# Patient Record
Sex: Male | Born: 1945
Health system: Southern US, Community
[De-identification: ages and names within clinical notes are randomized; demographics above are authoritative.]

## PROBLEM LIST (undated history)

## (undated) DIAGNOSIS — I2699 Other pulmonary embolism without acute cor pulmonale: Secondary | ICD-10-CM

## (undated) DIAGNOSIS — I214 Non-ST elevation (NSTEMI) myocardial infarction: Secondary | ICD-10-CM

## (undated) DIAGNOSIS — Z720 Tobacco use: Secondary | ICD-10-CM

## (undated) DIAGNOSIS — I10 Essential (primary) hypertension: Secondary | ICD-10-CM

## (undated) DIAGNOSIS — I480 Paroxysmal atrial fibrillation: Secondary | ICD-10-CM

## (undated) DIAGNOSIS — F1010 Alcohol abuse, uncomplicated: Secondary | ICD-10-CM

## (undated) HISTORY — DX: Other pulmonary embolism without acute cor pulmonale: I26.99

## (undated) HISTORY — DX: Non-ST elevation (NSTEMI) myocardial infarction: I21.4

## (undated) HISTORY — DX: Paroxysmal atrial fibrillation: I48.0

---

## 2018-06-14 DIAGNOSIS — I480 Paroxysmal atrial fibrillation: Secondary | ICD-10-CM

## 2018-06-14 HISTORY — DX: Paroxysmal atrial fibrillation: I48.0

## 2018-06-29 ENCOUNTER — Encounter (HOSPITAL_COMMUNITY): Payer: Self-pay | Admitting: Emergency Medicine

## 2018-06-29 ENCOUNTER — Inpatient Hospital Stay (HOSPITAL_COMMUNITY)
Admission: EM | Admit: 2018-06-29 | Discharge: 2018-07-07 | DRG: 233 | Disposition: A | Discharge status: Home / Self Care | Payer: Medicare PPO | Attending: Cardiothoracic Surgery | Admitting: Cardiothoracic Surgery

## 2018-06-29 ENCOUNTER — Other Ambulatory Visit: Payer: Self-pay

## 2018-06-29 ENCOUNTER — Encounter (HOSPITAL_COMMUNITY)
Admission: EM | Disposition: A | Discharge status: Home / Self Care | Payer: Self-pay | Source: Home / Self Care | Attending: Cardiothoracic Surgery

## 2018-06-29 ENCOUNTER — Inpatient Hospital Stay (HOSPITAL_COMMUNITY): Payer: Medicare PPO

## 2018-06-29 ENCOUNTER — Emergency Department (HOSPITAL_COMMUNITY): Payer: Medicare PPO

## 2018-06-29 ENCOUNTER — Other Ambulatory Visit (HOSPITAL_COMMUNITY): Payer: Medicare PPO

## 2018-06-29 DIAGNOSIS — I110 Hypertensive heart disease with heart failure: Secondary | ICD-10-CM | POA: Diagnosis present

## 2018-06-29 DIAGNOSIS — Z01810 Encounter for preprocedural cardiovascular examination: Secondary | ICD-10-CM | POA: Diagnosis not present

## 2018-06-29 DIAGNOSIS — I361 Nonrheumatic tricuspid (valve) insufficiency: Secondary | ICD-10-CM | POA: Diagnosis not present

## 2018-06-29 DIAGNOSIS — I214 Non-ST elevation (NSTEMI) myocardial infarction: Principal | ICD-10-CM | POA: Diagnosis present

## 2018-06-29 DIAGNOSIS — F1021 Alcohol dependence, in remission: Secondary | ICD-10-CM | POA: Diagnosis present

## 2018-06-29 DIAGNOSIS — I2510 Atherosclerotic heart disease of native coronary artery without angina pectoris: Secondary | ICD-10-CM | POA: Diagnosis present

## 2018-06-29 DIAGNOSIS — I2699 Other pulmonary embolism without acute cor pulmonale: Secondary | ICD-10-CM | POA: Diagnosis present

## 2018-06-29 DIAGNOSIS — I509 Heart failure, unspecified: Secondary | ICD-10-CM | POA: Diagnosis present

## 2018-06-29 DIAGNOSIS — Z8249 Family history of ischemic heart disease and other diseases of the circulatory system: Secondary | ICD-10-CM | POA: Diagnosis not present

## 2018-06-29 DIAGNOSIS — I255 Ischemic cardiomyopathy: Secondary | ICD-10-CM | POA: Diagnosis present

## 2018-06-29 DIAGNOSIS — Z9689 Presence of other specified functional implants: Secondary | ICD-10-CM

## 2018-06-29 DIAGNOSIS — Z9889 Other specified postprocedural states: Secondary | ICD-10-CM

## 2018-06-29 DIAGNOSIS — Z720 Tobacco use: Secondary | ICD-10-CM | POA: Diagnosis not present

## 2018-06-29 DIAGNOSIS — I480 Paroxysmal atrial fibrillation: Secondary | ICD-10-CM | POA: Diagnosis present

## 2018-06-29 DIAGNOSIS — I493 Ventricular premature depolarization: Secondary | ICD-10-CM | POA: Diagnosis present

## 2018-06-29 DIAGNOSIS — I252 Old myocardial infarction: Secondary | ICD-10-CM

## 2018-06-29 DIAGNOSIS — Z951 Presence of aortocoronary bypass graft: Secondary | ICD-10-CM | POA: Diagnosis not present

## 2018-06-29 DIAGNOSIS — Z79899 Other long term (current) drug therapy: Secondary | ICD-10-CM | POA: Diagnosis not present

## 2018-06-29 DIAGNOSIS — D62 Acute posthemorrhagic anemia: Secondary | ICD-10-CM | POA: Diagnosis not present

## 2018-06-29 DIAGNOSIS — E785 Hyperlipidemia, unspecified: Secondary | ICD-10-CM | POA: Diagnosis present

## 2018-06-29 DIAGNOSIS — R079 Chest pain, unspecified: Secondary | ICD-10-CM | POA: Diagnosis present

## 2018-06-29 DIAGNOSIS — Z09 Encounter for follow-up examination after completed treatment for conditions other than malignant neoplasm: Secondary | ICD-10-CM

## 2018-06-29 DIAGNOSIS — I25110 Atherosclerotic heart disease of native coronary artery with unstable angina pectoris: Secondary | ICD-10-CM

## 2018-06-29 DIAGNOSIS — F17210 Nicotine dependence, cigarettes, uncomplicated: Secondary | ICD-10-CM | POA: Diagnosis present

## 2018-06-29 HISTORY — DX: Non-ST elevation (NSTEMI) myocardial infarction: I21.4

## 2018-06-29 HISTORY — DX: Tobacco use: Z72.0

## 2018-06-29 HISTORY — DX: Essential (primary) hypertension: I10

## 2018-06-29 HISTORY — DX: Alcohol abuse, uncomplicated: F10.10

## 2018-06-29 HISTORY — PX: LEFT HEART CATH AND CORONARY ANGIOGRAPHY: CATH118249

## 2018-06-29 HISTORY — PX: IABP INSERTION: CATH118242

## 2018-06-29 HISTORY — DX: Other pulmonary embolism without acute cor pulmonale: I26.99

## 2018-06-29 LAB — COMPREHENSIVE METABOLIC PANEL
ALT: 20 U/L (ref 0–44)
AST: 85 U/L — ABNORMAL HIGH (ref 15–41)
Albumin: 4.1 g/dL (ref 3.5–5.0)
Alkaline Phosphatase: 38 U/L (ref 38–126)
Anion gap: 11 (ref 5–15)
BUN: 16 mg/dL (ref 8–23)
CO2: 22 mmol/L (ref 22–32)
Calcium: 8.8 mg/dL — ABNORMAL LOW (ref 8.9–10.3)
Chloride: 106 mmol/L (ref 98–111)
Creatinine, Ser: 1.18 mg/dL (ref 0.61–1.24)
GFR calc Af Amer: >60 mL/min (ref >60)
GFR calc non Af Amer: >60 mL/min (ref >60)
Glucose, Bld: 116 mg/dL — ABNORMAL HIGH (ref 70–99)
Potassium: 4.2 mmol/L (ref 3.5–5.1)
SODIUM: 139 mmol/L (ref 135–145)
Total Bilirubin: 1.1 mg/dL (ref 0.3–1.2)
Total Protein: 6.8 g/dL (ref 6.5–8.1)

## 2018-06-29 LAB — BRAIN NATRIURETIC PEPTIDE: B Natriuretic Peptide: 665.4 pg/mL — ABNORMAL HIGH (ref 0.0–100.0)

## 2018-06-29 LAB — CBC WITH DIFFERENTIAL/PLATELET
Abs Immature Granulocytes: 0.1 10*3/uL — ABNORMAL HIGH (ref 0.00–0.07)
Basophils Absolute: 0 10*3/uL (ref 0.0–0.1)
Basophils Relative: 0 %
EOS PCT: 0 %
Eosinophils Absolute: 0 10*3/uL (ref 0.0–0.5)
HCT: 50.8 % (ref 39.0–52.0)
Hemoglobin: 16.4 g/dL (ref 13.0–17.0)
Immature Granulocytes: 1 %
LYMPHS ABS: 1.2 10*3/uL (ref 0.7–4.0)
Lymphocytes Relative: 6 %
MCH: 30.1 pg (ref 26.0–34.0)
MCHC: 32.3 g/dL (ref 30.0–36.0)
MCV: 93.4 fL (ref 80.0–100.0)
Monocytes Absolute: 1.1 10*3/uL — ABNORMAL HIGH (ref 0.1–1.0)
Monocytes Relative: 6 %
NRBC: 0 % (ref 0.0–0.2)
Neutro Abs: 16.2 10*3/uL — ABNORMAL HIGH (ref 1.7–7.7)
Neutrophils Relative %: 87 %
Platelets: 257 10*3/uL (ref 150–400)
RBC: 5.44 MIL/uL (ref 4.22–5.81)
RDW: 13.7 % (ref 11.5–15.5)
WBC: 18.6 10*3/uL — ABNORMAL HIGH (ref 4.0–10.5)

## 2018-06-29 LAB — TROPONIN I
TROPONIN I: 30.55 ng/mL — AB (ref <0.03)
Troponin I: 46.33 ng/mL (ref <0.03)

## 2018-06-29 LAB — D-DIMER, QUANTITATIVE: D-Dimer, Quant: 0.93 ug/mL-FEU — ABNORMAL HIGH (ref 0.00–0.50)

## 2018-06-29 LAB — LIPID PANEL
Cholesterol: 250 mg/dL — ABNORMAL HIGH (ref 0–200)
HDL: 69 mg/dL (ref >40)
LDL Cholesterol: 169 mg/dL — ABNORMAL HIGH (ref 0–99)
TRIGLYCERIDES: 62 mg/dL (ref <150)
Total CHOL/HDL Ratio: 3.6 RATIO
VLDL: 12 mg/dL (ref 0–40)

## 2018-06-29 LAB — I-STAT TROPONIN, ED
Troponin i, poc: 6.71 ng/mL (ref 0.00–0.08)
Troponin i, poc: 24.18 ng/mL (ref 0.00–0.08)

## 2018-06-29 LAB — PROTIME-INR
INR: 1.01
Prothrombin Time: 13.2 seconds (ref 11.4–15.2)

## 2018-06-29 LAB — HEMOGLOBIN A1C
Hgb A1c MFr Bld: 5.4 % (ref 4.8–5.6)
Mean Plasma Glucose: 108.28 mg/dL

## 2018-06-29 LAB — MRSA PCR SCREENING: MRSA BY PCR: NEGATIVE

## 2018-06-29 LAB — ETHANOL: Alcohol, Ethyl (B): <10 mg/dL (ref <10)

## 2018-06-29 LAB — APTT: aPTT: 29 seconds (ref 24–36)

## 2018-06-29 SURGERY — LEFT HEART CATH AND CORONARY ANGIOGRAPHY
Anesthesia: LOCAL | Laterality: Right

## 2018-06-29 MED ORDER — HEPARIN SODIUM (PORCINE) 1000 UNIT/ML IJ SOLN
INTRAMUSCULAR | Status: AC
  Filled 2018-06-29: qty 1

## 2018-06-29 MED ORDER — FENTANYL CITRATE (PF) 100 MCG/2ML IJ SOLN
INTRAMUSCULAR | Status: DC | PRN
  Administered 2018-06-29 (×2): 25 ug via INTRAVENOUS

## 2018-06-29 MED ORDER — HEPARIN (PORCINE) 25000 UT/250ML-% IV SOLN
800.0000 [IU]/h | INTRAVENOUS | Status: AC
  Filled 2018-06-29: qty 250

## 2018-06-29 MED ORDER — ORAL CARE MOUTH RINSE
15.0000 mL | Freq: Two times a day (BID) | OROMUCOSAL | Status: DC
  Administered 2018-06-30: 15 mL via OROMUCOSAL

## 2018-06-29 MED ORDER — MIDAZOLAM HCL 2 MG/2ML IJ SOLN
INTRAMUSCULAR | Status: DC | PRN
  Administered 2018-06-29 (×2): 1 mg via INTRAVENOUS

## 2018-06-29 MED ORDER — SODIUM CHLORIDE 0.9% FLUSH: 3.0000 mL | Freq: Two times a day (BID) | INTRAVENOUS | Status: DC

## 2018-06-29 MED ORDER — SODIUM CHLORIDE 0.9% FLUSH: 3.0000 mL | INTRAVENOUS | Status: DC | PRN

## 2018-06-29 MED ORDER — NITROGLYCERIN 0.4 MG SL SUBL: 0.4000 mg | SUBLINGUAL_TABLET | SUBLINGUAL | Status: DC | PRN

## 2018-06-29 MED ORDER — SODIUM CHLORIDE 0.9 % WEIGHT BASED INFUSION: 3.0000 mL/kg/h | INTRAVENOUS | Status: DC

## 2018-06-29 MED ORDER — MORPHINE SULFATE (PF) 2 MG/ML IV SOLN
INTRAVENOUS | Status: AC
  Administered 2018-06-29: 2 mg via INTRAVENOUS
  Filled 2018-06-29: qty 1

## 2018-06-29 MED ORDER — CHLORHEXIDINE GLUCONATE 0.12 % MT SOLN
15.0000 mL | Freq: Two times a day (BID) | OROMUCOSAL | Status: DC
  Administered 2018-06-29 – 2018-06-30 (×2): 15 mL via OROMUCOSAL
  Filled 2018-06-29 (×2): qty 15

## 2018-06-29 MED ORDER — SODIUM CHLORIDE 0.9 % IV SOLN: 250.0000 mL | INTRAVENOUS | Status: DC | PRN

## 2018-06-29 MED ORDER — ONDANSETRON HCL 4 MG/2ML IJ SOLN: 4.0000 mg | Freq: Four times a day (QID) | INTRAMUSCULAR | Status: DC | PRN

## 2018-06-29 MED ORDER — FENTANYL CITRATE (PF) 100 MCG/2ML IJ SOLN
INTRAMUSCULAR | Status: AC
  Filled 2018-06-29: qty 2

## 2018-06-29 MED ORDER — HEPARIN (PORCINE) 25000 UT/250ML-% IV SOLN
800.0000 [IU]/h | INTRAVENOUS | Status: DC
  Administered 2018-06-29: 800 [IU]/h via INTRAVENOUS
  Filled 2018-06-29: qty 250

## 2018-06-29 MED ORDER — HEPARIN SODIUM (PORCINE) 1000 UNIT/ML IJ SOLN
INTRAMUSCULAR | Status: DC | PRN
  Administered 2018-06-29: 5000 [IU] via INTRAVENOUS

## 2018-06-29 MED ORDER — LIDOCAINE HCL (PF) 1 % IJ SOLN
INTRAMUSCULAR | Status: DC | PRN
  Administered 2018-06-29: 15 mL
  Administered 2018-06-29: 2 mL

## 2018-06-29 MED ORDER — HEPARIN (PORCINE) IN NACL 1000-0.9 UT/500ML-% IV SOLN
INTRAVENOUS | Status: DC | PRN
  Administered 2018-06-29 (×3): 500 mL

## 2018-06-29 MED ORDER — HEPARIN BOLUS VIA INFUSION
3800.0000 [IU] | Freq: Once | INTRAVENOUS | Status: AC
  Administered 2018-06-29: 3800 [IU] via INTRAVENOUS
  Filled 2018-06-29: qty 3800

## 2018-06-29 MED ORDER — ATORVASTATIN CALCIUM 80 MG PO TABS
80.0000 mg | ORAL_TABLET | Freq: Every day | ORAL | Status: DC
  Administered 2018-06-29 – 2018-07-06 (×7): 80 mg via ORAL
  Filled 2018-06-29 (×7): qty 1

## 2018-06-29 MED ORDER — IOPAMIDOL (ISOVUE-370) INJECTION 76%
INTRAVENOUS | Status: AC
  Filled 2018-06-29: qty 100

## 2018-06-29 MED ORDER — ASPIRIN EC 81 MG PO TBEC
81.0000 mg | DELAYED_RELEASE_TABLET | Freq: Every day | ORAL | Status: DC
  Administered 2018-06-30: 81 mg via ORAL
  Filled 2018-06-29: qty 1

## 2018-06-29 MED ORDER — VERAPAMIL HCL 2.5 MG/ML IV SOLN
INTRAVENOUS | Status: AC
  Filled 2018-06-29: qty 2

## 2018-06-29 MED ORDER — IOHEXOL 350 MG/ML SOLN
INTRAVENOUS | Status: DC | PRN
  Administered 2018-06-29: 65 mL via INTRAVENOUS

## 2018-06-29 MED ORDER — ACETAMINOPHEN 325 MG PO TABS: 650.0000 mg | ORAL_TABLET | ORAL | Status: DC | PRN

## 2018-06-29 MED ORDER — MORPHINE SULFATE (PF) 2 MG/ML IV SOLN
2.0000 mg | Freq: Once | INTRAVENOUS | Status: AC
  Administered 2018-06-29 (×2): 2 mg via INTRAVENOUS

## 2018-06-29 MED ORDER — INFLUENZA VAC SPLIT HIGH-DOSE 0.5 ML IM SUSY
0.5000 mL | PREFILLED_SYRINGE | INTRAMUSCULAR | Status: DC
  Filled 2018-06-29: qty 0.5

## 2018-06-29 MED ORDER — PNEUMOCOCCAL VAC POLYVALENT 25 MCG/0.5ML IJ INJ: 0.5000 mL | INJECTION | INTRAMUSCULAR | Status: DC | PRN

## 2018-06-29 MED ORDER — IOPAMIDOL (ISOVUE-370) INJECTION 76%
100.0000 mL | Freq: Once | INTRAVENOUS | Status: AC | PRN
  Administered 2018-06-29: 100 mL via INTRAVENOUS

## 2018-06-29 MED ORDER — SODIUM CHLORIDE 0.9% FLUSH
3.0000 mL | Freq: Two times a day (BID) | INTRAVENOUS | Status: DC
  Administered 2018-06-29 – 2018-06-30 (×3): 3 mL via INTRAVENOUS

## 2018-06-29 MED ORDER — VERAPAMIL HCL 2.5 MG/ML IV SOLN
INTRAVENOUS | Status: DC | PRN
  Administered 2018-06-29: 10 mL via INTRA_ARTERIAL

## 2018-06-29 MED ORDER — METOPROLOL TARTRATE 12.5 MG HALF TABLET
12.5000 mg | ORAL_TABLET | Freq: Two times a day (BID) | ORAL | Status: DC
  Administered 2018-06-29 – 2018-06-30 (×4): 12.5 mg via ORAL
  Filled 2018-06-29 (×4): qty 1

## 2018-06-29 MED ORDER — HEPARIN (PORCINE) IN NACL 1000-0.9 UT/500ML-% IV SOLN
INTRAVENOUS | Status: AC
  Filled 2018-06-29: qty 500

## 2018-06-29 MED ORDER — HEPARIN (PORCINE) IN NACL 1000-0.9 UT/500ML-% IV SOLN
INTRAVENOUS | Status: AC
  Filled 2018-06-29: qty 1000

## 2018-06-29 MED ORDER — MIDAZOLAM HCL 2 MG/2ML IJ SOLN
INTRAMUSCULAR | Status: AC
  Filled 2018-06-29: qty 2

## 2018-06-29 MED ORDER — MORPHINE SULFATE (PF) 10 MG/ML IV SOLN: 2.0000 mg | Freq: Once | INTRAVENOUS | Status: DC

## 2018-06-29 MED ORDER — SODIUM CHLORIDE 0.9 % WEIGHT BASED INFUSION: 1.0000 mL/kg/h | INTRAVENOUS | Status: DC

## 2018-06-29 MED ORDER — LIDOCAINE HCL (PF) 1 % IJ SOLN
INTRAMUSCULAR | Status: AC
  Filled 2018-06-29: qty 30

## 2018-06-29 SURGICAL SUPPLY — 15 items
BALLN IABP SENSA PLUS 7.5F 40C (BALLOONS) ×3
BALLOON IABP SENS PLUS 7.5F40C (BALLOONS) ×2 IMPLANT
CATH 5FR JL3.5 JR4 ANG PIG MP (CATHETERS) ×3 IMPLANT
GLIDESHEATH SLEND SS 6F .021 (SHEATH) ×3 IMPLANT
GUIDEWIRE INQWIRE 1.5J.035X260 (WIRE) ×2 IMPLANT
INQWIRE 1.5J .035X260CM (WIRE) ×3
KIT ENCORE 26 ADVANTAGE (KITS) ×3 IMPLANT
KIT HEART LEFT (KITS) ×3 IMPLANT
PACK CARDIAC CATHETERIZATION (CUSTOM PROCEDURE TRAY) ×3 IMPLANT
SHEATH PINNACLE 5F 10CM (SHEATH) ×3 IMPLANT
SHEATH PROBE COVER 6X72 (BAG) ×3 IMPLANT
SYR MEDRAD MARK 7 150ML (SYRINGE) ×6 IMPLANT
TRANSDUCER W/STOPCOCK (MISCELLANEOUS) ×3 IMPLANT
TUBING CIL FLEX 10 FLL-RA (TUBING) ×3 IMPLANT
WIRE EMERALD 3MM-J .035X150CM (WIRE) ×3 IMPLANT

## 2018-06-29 NOTE — Significant Event (Signed)
Rapid Response Event Note RN called for possible stemi  Overview: Time Called: 1528 Arrival Time: 1530 Event Type: Cardiac  Initial Focused Assessment: On arrival pt sitting upright in bed extremely anxious, on a NRB stating he couldn't breathe, unable to get an EKG as pt kept pulling himself up into a tripod position, diaphoretic. Dr. Harrell Gave with Cardiology at bedside    Interventions: bipap 2 mg Morphine IVP x2 Nitro SL x2 Transferred to cath lab    Event Summary: Name of Physician Notified: DR. Harrell Gave (at bedside on arrival) at      at    Outcome: Transferred (Comment)(cath lab )  Event End Time: 1805  Randy [name]

## 2018-06-29 NOTE — ED Notes (Signed)
Patient transported to CT 

## 2018-06-29 NOTE — Consult Note (Signed)
[address]       [place] [address]             [phone]        Wiley Besse Hutchins Medical Record #[id number] Date of Birth: 15-Jun-1946  Referring: Dr. Burt Knack Primary Care: System, Pcp Not In Primary Cardiologist:No primary care provider on file.  Chief Complaint:    Chief Complaint  Patient presents with  . Shortness of Breath  . Chest Pain    History of Present Illness:     Patient is a 72 year old male who does not have significant medical care currently.  Travels back and forth from [place].  He was in town for the weekend and plans on driving back 10 hours this morning.  He became increasingly short of breath diaphoretic.  Ultimately was brought from outpatient site to [facility] emergency room approximately 1030 this morning.  CT of the chest was done.  Troponins were elevated.  Patient was admitted to 6 E. but became increasingly short of breath there and underwent emergent cardiac catheterization 6:00 this evening.  He is now in ICU with intra-aortic balloon pump in place.  He notes he feels much better now on nasal cannula oxygen breathing without difficulty.   Patient is a long-term smoker more than 50 years , he denies diabetes , he has no previous cardiac history   current Activity/ Functional Status: Patient is independent with mobility/ambulation, transfers, ADL's, IADL's.   Zubrod Score: At the time of surgery this patient's most appropriate activity status/level should be described as: []     0    Normal activity, no symptoms [x]     1    Restricted in physical strenuous activity but ambulatory, able to do out light work []     2    Ambulatory and capable of self care, unable to do work activities, up and about                 more than 50%  Of the time                            []     3    Only limited self care, in bed greater than 50% of waking hours []     4    Completely disabled, no self care, confined to bed or chair []     5     Moribund  Past Medical History:  Diagnosis Date  . Alcohol abuse    Last drink was 10 years ago (2009).  Marland Kitchen Hypertension   . Tobacco abuse     History reviewed. No pertinent surgical history.  Social History   Tobacco Use  Smoking Status Former Smoker  . Packs/day: 0.50  . Years: 50.00  . Pack years: 25.00  . Types: Cigarettes  . Last attempt to quit: 06/29/2018  Smokeless Tobacco Never Used    Social History   Substance and Sexual Activity  Alcohol Use Not Currently     No Known Allergies  Current Facility-Administered Medications  Medication Dose Route Frequency Provider Last Rate Last Dose  . 0.9 %  sodium chloride infusion  250 mL Intravenous PRN Sherren Mocha, MD      . acetaminophen (TYLENOL) tablet 650 mg  650 mg Oral Q4H PRN Sherren Mocha, MD      . Derrill Memo ON 06/30/2018] aspirin EC tablet 81 mg  81 mg Oral Daily Sherren Mocha, MD      .  atorvastatin (LIPITOR) tablet 80 mg  80 mg Oral q1800 Sherren Mocha, MD   80 mg at 06/29/18 1722  . chlorhexidine (PERIDEX) 0.12 % solution 15 mL  15 mL Mouth Rinse BID Buford Dresser, MD      . heparin ADULT infusion 100 units/mL (25000 units/275mL sodium chloride 0.45%)  800 Units/hr Intravenous Continuous Lyndee Leo, RPH 8 mL/hr at 06/29/18 2000 800 Units/hr at 06/29/18 2000  . [START ON 07/01/2018] Influenza vac split quadrivalent PF (FLUZONE HIGH-DOSE) injection 0.5 mL  0.5 mL Intramuscular Tomorrow-1000 Sherren Mocha, MD      . Derrill Memo ON 06/30/2018] MEDLINE mouth rinse  15 mL Mouth Rinse q12n4p Buford Dresser, MD      . metoprolol tartrate (LOPRESSOR) tablet 12.5 mg  12.5 mg Oral BID Sherren Mocha, MD   12.5 mg at 06/29/18 1359  . Morphine Sulfate (PF) SOLN 2 mg  2 mg Intravenous Once Sherren Mocha, MD      . nitroGLYCERIN (NITROSTAT) SL tablet 0.4 mg  0.4 mg Sublingual Q5 Min x 3 PRN Sherren Mocha, MD      . ondansetron [facility]) injection 4 mg  4 mg Intravenous Q6H PRN Sherren Mocha, MD       . Derrill Memo ON 07/01/2018] pneumococcal 23 valent vaccine (PNU-IMMUNE) injection 0.5 mL  0.5 mL Intramuscular Prior to discharge Sherren Mocha, MD      . sodium chloride flush (NS) 0.9 % injection 3 mL  3 mL Intravenous Q12H Sherren Mocha, MD      . sodium chloride flush (NS) 0.9 % injection 3 mL  3 mL Intravenous PRN Sherren Mocha, MD        No medications prior to admission.    Family History  Problem Relation Age of Onset  . CAD Father        died from a "massive heart attack" in his mid 80's  . Stroke Neg Hx   . Sudden death Neg Hx      Review of Systems:   Pertinent items are noted in HPI.     Cardiac Review of Systems: Y or  [    ]= no  Chest Pain [  y ]  Resting SOB [  y ] Exertional SOB  Finazzo.Reese  ]  Orthopnea Lovejoy.Reese  ]   Pedal Edema Florencio.Farrier   ]    Palpitations Florencio.Farrier  ] Syncope  Florencio.Farrier  ]   Presyncope [ n  ]  General Review of Systems: [Y] = yes [  ]=no Constitional: recent weight change [  ]; anorexia [  ]; fatigue [  ]; nausea [  ]; night sweats [  ]; fever [  ]; or chills [  ]                                                               Dental: Last Dentist visit:   Eye : blurred vision [  ]; diplopia [   ]; vision changes [  ];  Amaurosis fugax[  ]; Resp: cough [  ];  wheezing[  ];  hemoptysis[  ]; shortness of breath[y  ]; paroxysmal nocturnal dyspnea[ y ]; dyspnea on exertion[  ]; or orthopnea[  ];  GI:  gallstones[  ], vomiting[  ];  dysphagia[  ]; melena[  ];  hematochezia [  ]; heartburn[  ];   Hx of  Colonoscopy[  ]; GU: kidney stones [  ]; hematuria[  ];   dysuria [  ];  nocturia[  ];  history of     obstruction [  ]; urinary frequency [  ]             Skin: rash, swelling[  ];, hair loss[  ];  peripheral edema[  ];  or itching[  ]; Musculosketetal: myalgias[  ];  joint swelling[  ];  joint erythema[  ];  joint pain[  ];  back pain[  ];  Heme/Lymph: bruising[  ];  bleeding[  ];  anemia[  ];  Neuro: TIA[  ];  headaches[  ];  stroke[  ];  vertigo[  ];  seizures[  ];    paresthesias[  ];  difficulty walking[  ];  Psych:depression[  ]; anxiety[  ];  Endocrine: diabetes[ n ];  thyroid dysfunction[  ];              Physical Exam: BP (!) 137/93   Pulse 81   Temp 97.7 F (36.5 C) (Oral)   Resp 14   Ht 5\' 3"  (1.6 m)   Wt 70.6 kg   SpO2 99%   BMI 27.57 kg/m    General appearance: alert, cooperative, appears older than stated age and no distress Head: Normocephalic, without obvious abnormality, atraumatic Neck: no adenopathy, no carotid bruit, no JVD, supple, symmetrical, trachea midline and thyroid not enlarged, symmetric, no tenderness/mass/nodules Lymph nodes: Cervical, supraclavicular, and axillary nodes normal. Resp: diminished breath sounds bilaterally Back: symmetric, no curvature. ROM normal. No CVA tenderness. Cardio: regular rate and rhythm, S1, S2 normal, no murmur, click, rub or gallop GI: soft, non-tender; bowel sounds normal; no masses,  no organomegaly Extremities: Intra-aortic balloon pump and right femoral artery, dopplerable PT pulses bilaterally faint, no palpable or dopplerable DP pulses, both feet were sensate with patient complaining of pain Neurologic: Grossly normal  Diagnostic Studies & Laboratory data:     Recent Radiology Findings:   Ct Angio Chest Pe W Or Wo Contrast  Result Date: 06/29/2018 CLINICAL DATA:  Chest pain and shortness of breath since 7 a.m. this morning. EXAM: CT ANGIOGRAPHY CHEST WITH CONTRAST TECHNIQUE: Multidetector CT imaging of the chest was performed using the standard protocol during bolus administration of intravenous contrast. Multiplanar CT image reconstructions and MIPs were obtained to evaluate the vascular anatomy. CONTRAST:  <See Chart> ISOVUE-370 IOPAMIDOL (ISOVUE-370) INJECTION 76% COMPARISON:  None. FINDINGS: Cardiovascular: The heart is mildly enlarged but stable. No pericardial effusion. The aorta is normal in caliber. Moderate atherosclerotic calcifications. Scattered coronary artery  calcifications. The pulmonary arterial tree is fairly well opacified. There are small bilateral filling defects consistent with pulmonary embolism. No right heart strain. Mediastinum/Nodes: Scattered mediastinal and hilar lymph nodes. The esophagus is grossly normal. Lungs/Pleura: Moderate breathing motion artifact. No worrisome pulmonary lesions. There are small bilateral pleural effusions with vascular congestion and mild pulmonary edema. No infiltrates. No pulmonary lesions. Upper Abdomen: No significant upper abdominal findings. Musculoskeletal: No chest wall mass, supraclavicular or axillary adenopathy. No significant bony findings. There are mild compression deformities involving T7 and O35 of uncertain age. Review of the MIP images confirms the above findings. IMPRESSION: 1. Small bilateral pulmonary emboli. 2. Mild cardiac enlargement, vascular congestion and mild pulmonary edema along with small pleural effusions. 3. Atherosclerotic calcifications involving the thoracic aorta but no aneurysm. 4. No mediastinal or hilar mass or adenopathy. Aortic Atherosclerosis (  ICD10-I70.0). Electronically Signed   By: Marijo Sanes M.D.   On: 06/29/2018 15:20   Dg Chest Port 1 View  Result Date: 06/29/2018 CLINICAL DATA:  Shortness of breath, check IABP EXAM: PORTABLE CHEST 1 VIEW COMPARISON:  Films from earlier in the same day. FINDINGS: Intra-aortic balloon pump has been placed with the tip just below the aortic knob. Aortic calcifications are again seen. Cardiac shadow is stable. Mild central vascular congestion with mild interstitial edema is seen. No focal confluent infiltrate is seen. IMPRESSION: Intra-aortic balloon pump as described. Central vascular congestion with mild interstitial edema. Electronically Signed   By: Inez Catalina M.D.   On: 06/29/2018 20:16   Dg Chest Port 1 View  Result Date: 06/29/2018 CLINICAL DATA:  Shortness of breath. Chest pain. EXAM: PORTABLE CHEST 1 VIEW COMPARISON:  None.  FINDINGS: The patient has bilateral mild interstitial pulmonary edema. Heart size and pulmonary vascularity are within normal limits. No discrete effusions. Slight compression deformity of T7, age indeterminate. IMPRESSION: Bilateral interstitial pulmonary edema. Compression fracture of T7, age indeterminate. Electronically Signed   By: Lorriane Shire M.D.   On: 06/29/2018 11:25     I have independently reviewed the above radiologic studies and discussed with the patient   Recent Lab Findings: Lab Results  Component Value Date   WBC 18.6 (H) 06/29/2018   HGB 16.4 06/29/2018   HCT 50.8 06/29/2018   PLT 257 06/29/2018   GLUCOSE 116 (H) 06/29/2018   CHOL 250 (H) 06/29/2018   TRIG 62 06/29/2018   HDL 69 06/29/2018   LDLCALC 169 (H) 06/29/2018   ALT 20 06/29/2018   AST 85 (H) 06/29/2018   NA 139 06/29/2018   K 4.2 06/29/2018   CL 106 06/29/2018   CREATININE 1.18 06/29/2018   BUN 16 06/29/2018   CO2 22 06/29/2018   INR 1.01 06/29/2018   HGBA1C 5.4 06/29/2018   Lab Results  Component Value Date   TROPONINI 30.55 ([facility]) 06/29/2018    Mid RCA lesion is 100% stenosed.  Mid LM to Dist LM lesion is 30% stenosed.  Prox LAD lesion is 100% stenosed.  Ost 2nd Mrg lesion is 100% stenosed.  LV end diastolic pressure is moderately elevated.  There is moderate to severe left ventricular systolic dysfunction.   1.  Severe three-vessel coronary artery disease likely with recent occlusion of the RCA 2.  Total occlusion of the mid RCA, proximal LAD, and second obtuse marginal, all vessels supplied by collateral branches 3.  Moderately severe LV systolic dysfunction with LVEF estimated at 35% with akinesis of the anterolateral wall and basal inferior walls 4.  Diffuse aortoiliac disease without evidence of abdominal aortic aneurysm 5.  Successful placement of an intra-aortic balloon pump via right femoral artery access  Recommendations: Once the patient is stabilized, I think he would likely  benefit from surgical revascularization for treatment of his critical multivessel coronary artery disease.  A TCTS consult will be placed.  There is moderate to severe left ventricular systolic dysfunction. LV end diastolic pressure is moderately elevated. There are LV function abnormalities due to segmental dysfunction. There is moderately severe LV systolic dysfunction with LVEF estimated at 35%. There is akinesis of the anterolateral wall and akinesis of the basal inferior wall. The LV apex is hypokinetic. 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  This is been discussed with the patient in detail.  With his contrast load today, will allow to stabilize and diurese some and potentially proceed with coronary artery bypass grafting on Wednesday, December 18.  Echocardiogram pending-   Small bilateral pulmonary emboli.     Grace Isaac MD      [address] [place] [address] Office [phone]   Beeper [phone]  06/29/2018 8:32 PM

## 2018-06-29 NOTE — ED Provider Notes (Addendum)
[facility] EMERGENCY DEPARTMENT Provider Note   CSN: [id number] Arrival date & time: 06/29/18  1027     History   Chief Complaint Chief Complaint  Patient presents with  . Shortness of Breath  . Chest Pain    HPI Randy [name] is a 72 y.o. male.  Patient presents to the emergency department from outside urgent care with complaint of chest pain and shortness of breath.  Patient has a 50-year smoking history.  He does not have any other diagnosed medical conditions but admits to not seeing a doctor in the past 20 years or so.  States that he has been having worsening shortness of breath with exertion over the past 6 months.  This morning he was getting ready to leave his brother's house in the area and became very short of breath after bringing his bags to his car.  He tried to drive but after a couple miles was too short of breath to continue and he turned around and went back.  He then went to an outside urgent care and was found to have an abnormal EKG.  He was sent to the emergency department for further evaluation.  Patient received an albuterol treatment, aspirin, and a nitroglycerin which seemed to improve his symptoms and he is now more comfortable.  Pain was in the mid chest.  It did not radiate.  He did not have any associated vomiting, diaphoresis.  No lower extremity swelling or calf tenderness.  He denies chest pain with his recent short of breath episodes.  No abdominal pain.  Onset of symptoms acute.  Course improved.       History reviewed. No pertinent past medical history.  There are no active problems to display for this patient.    Home Medications    Prior to Admission medications   Not on File    Family History No family history on file.  Social History Social History   Tobacco Use  . Smoking status: Not on file  Substance Use Topics  . Alcohol use: Not on file  . Drug use: Not on file     Allergies   Patient has no allergy  information on record.   Review of Systems Review of Systems  Constitutional: Negative for diaphoresis and fever.  Eyes: Negative for redness.  Respiratory: Positive for shortness of breath. Negative for cough.   Cardiovascular: Positive for chest pain. Negative for palpitations and leg swelling.  Gastrointestinal: Negative for abdominal pain, nausea and vomiting.  Genitourinary: Negative for dysuria.  Musculoskeletal: Negative for back pain and neck pain.  Skin: Negative for rash.  Neurological: Negative for syncope and light-headedness.  Psychiatric/Behavioral: The patient is not nervous/anxious.      Physical Exam Updated Vital Signs BP (!) 166/107 (BP Location: Right Arm)   Pulse 100   Temp (!) 97.4 F (36.3 C) (Oral)   Resp 18   SpO2 92%   Physical Exam Vitals signs and nursing note reviewed.  Constitutional:      Appearance: He is well-developed. He is not diaphoretic.  HENT:     Head: Normocephalic and atraumatic.     Mouth/Throat:     Mouth: Mucous membranes are not dry.  Eyes:     Conjunctiva/sclera: Conjunctivae normal.  Neck:     Musculoskeletal: Normal range of motion and neck supple. No muscular tenderness.     Vascular: Normal carotid pulses. No carotid bruit or JVD.     Trachea: Trachea normal. No tracheal deviation.  Cardiovascular:     Rate and Rhythm: Normal rate and regular rhythm.     Pulses: No decreased pulses.     Heart sounds: Normal heart sounds, S1 normal and S2 normal. Heart sounds not distant. No murmur.  Pulmonary:     Effort: Pulmonary effort is normal. No respiratory distress.     Breath sounds: Normal breath sounds. No wheezing.     Comments: Lungs are clear at time of exam. Chest:     Chest wall: No tenderness.  Abdominal:     General: Bowel sounds are normal.     Palpations: Abdomen is soft.     Tenderness: There is no abdominal tenderness. There is no guarding or rebound.  Musculoskeletal:     Right lower leg: He exhibits no  tenderness. No edema.     Left lower leg: He exhibits no tenderness. No edema.     Comments: No clinical signs and symptoms of DVT.   Skin:    General: Skin is warm and dry.     Coloration: Skin is not pale.  Neurological:     Mental Status: He is alert.      ED Treatments / Results  Labs (all labs ordered are listed, but only abnormal results are displayed) Labs Reviewed  CBC WITH DIFFERENTIAL/PLATELET - Abnormal; Notable for the following components:      Result Value   WBC 18.6 (*)    Neutro Abs 16.2 (*)    Monocytes Absolute 1.1 (*)    Abs Immature Granulocytes 0.10 (*)    All other components within normal limits  D-DIMER, QUANTITATIVE (NOT AT [facility]) - Abnormal; Notable for the following components:   D-Dimer, Quant 0.93 (*)    All other components within normal limits  I-STAT TROPONIN, ED - Abnormal; Notable for the following components:   Troponin i, poc 6.71 (*)    All other components within normal limits  COMPREHENSIVE METABOLIC PANEL  ETHANOL  PROTIME-INR  APTT  LIPID PANEL    EKG EKG Interpretation  Date/Time:  Monday June 29 2018 10:31:11 EST Ventricular Rate:  101 PR Interval:    QRS Duration: 107 QT Interval:  381 QTC Calculation: 494 R Axis:   9 Text Interpretation:  Sinus tachycardia Multiple ventricular premature complexes Biatrial enlargement Inferior infarct, age indeterminate Anterolateral infarct, age indeterminate No old tracing to compare Confirmed by Malvin Johns [phone]) on 06/29/2018 10:54:45 AM   Radiology No results found.  Procedures Procedures (including critical care time)  Medications Ordered in ED Medications - No data to display   Initial Impression / Assessment and Plan / ED Course  I have reviewed the triage vital signs and the nursing notes.  Pertinent labs & imaging results that were available during my care of the patient were reviewed by me and considered in my medical decision making (see chart for  details).     Patient seen and examined.  Concerning story.  EKG reviewed with Dr. Tamera Punt.  Patient with lateral T wave inversions, inferior Q waves.  No old for comparison.  ASA given prior to arrival.   Vital signs reviewed and are as follows: BP (!) 166/107 (BP Location: Right Arm)   Pulse 100   Temp (!) 97.4 F (36.3 C) (Oral)   Resp 18   SpO2 92%   11:01 AM Troponin elevated.  Patient seen with Dr. Tamera Punt.  Patient is having a non-ST elevation MI at this point.  His symptoms are resolved and he feels back to his  normal self.  Discussed need for admission.  Heparin ordered.  Cardiology consultation ordered.  11:18 AM Discussed with cardiology, they will see patient.   D-dimer noted to be elevated. I suspect this is related to ACS and PE is less likely differential. With symptoms currently improved, will leave CT decision to cardiology as I suspected he will need a cath today.   CRITICAL CARE Performed by: Carlisle Cater PA-C Total critical care time: 35 minutes Critical care time was exclusive of separately billable procedures and treating other patients. Critical care was necessary to treat or prevent imminent or life-threatening deterioration. Critical care was time spent personally by me on the following activities: development of treatment plan with patient and/or surrogate as well as nursing, discussions with consultants, evaluation of patient's response to treatment, examination of patient, obtaining history from patient or surrogate, ordering and performing treatments and interventions, ordering and review of laboratory studies, ordering and review of radiographic studies, pulse oximetry and re-evaluation of patient's condition.  12:54 PM Repeat EKG stable.   Final Clinical Impressions(s) / ED Diagnoses   Final diagnoses:  NSTEMI (non-ST elevated myocardial infarction) ([facility])   Admit.   ED Discharge Orders    None       Carlisle Cater, PA-C 06/29/18 1121    Carlisle Cater, PA-C 06/29/18 1254    Malvin Johns, MD 06/29/18 1534

## 2018-06-29 NOTE — Significant Event (Signed)
I was on 6e floor at approximately 5 PM when I was notified by the nurse that [name] was having chest pain. On my arrival, he was in acute distress, barely able to speak and noting he was in severe pain. Attempted to get STAT ECG was complicated by his movement. He was given nitroglycerin and morphine without relief of his symptoms. He was placed on Bipap due to increase work of breathing. The cath lab was activated, and Dr. Burt Knack performed emergent coronary angiography (see results) and balloon pump placement. Dr. Servando Snare of CT surgery will evaluate patient for CABG tomorrow.  Randy Dresser, MD, PhD [facility]  [address] [place], [place] [address] [phone]

## 2018-06-29 NOTE — ED Notes (Signed)
Dr. Harrell Gave w/ Cards paged to [facility] RN to [phone] at 1458.

## 2018-06-29 NOTE — Progress Notes (Signed)
ANTICOAGULATION CONSULT NOTE - Initial Consult  Pharmacy Consult for heparin Indication: chest pain/ACS   Patient Measurements: Height: 5\' 4"  (162.6 cm) Weight: 140 lb (63.5 kg) IBW/kg (Calculated) : 59.2 Heparin Dosing Weight: 63.5 kg  Vital Signs: Temp: 97.4 F (36.3 C) (12/16 1032) Temp Source: Oral (12/16 1032) BP: 152/88 (12/16 1130) Pulse Rate: 91 (12/16 1130)  Labs: Recent Labs    06/29/18 1035  HGB 16.4  HCT 50.8  PLT 257  APTT 29  LABPROT 13.2  INR 1.01  CREATININE 1.18     Assessment: 72 yo admitted with chest pain and SOB.  Sent from urgent care with abnormal EKG. Starting heparin gtt for rule out ACS. Trop + CBC wnl.   Goal of Therapy:  Heparin level 0.3-0.7 units/ml Monitor platelets by anticoagulation protocol: Yes    Plan:  -Heparin 3800 units x1 then 800 units/hr -Daily HL, CBC -Check level in 6 hours   Aryan Bello, Jake Church 06/29/2018,11:41 AM

## 2018-06-29 NOTE — Progress Notes (Signed)
ANTICOAGULATION CONSULT NOTE  Pharmacy Consult for heparin Indication: chest pain/ACS   Patient Measurements: Height: 5\' 3"  (160 cm) Weight: 155 lb 10.3 oz (70.6 kg) IBW/kg (Calculated) : 56.9 Heparin Dosing Weight: 63.5 kg  Vital Signs: Temp: 97.7 F (36.5 C) (12/16 1930) Temp Source: Oral (12/16 1930) BP: 137/93 (12/16 2015) Pulse Rate: 81 (12/16 2015)  Labs: Recent Labs    06/29/18 1035 06/29/18 1344  HGB 16.4  --   HCT 50.8  --   PLT 257  --   APTT 29  --   LABPROT 13.2  --   INR 1.01  --   CREATININE 1.18  --   TROPONINI  --  30.55*     Assessment: 72 yo admitted with chest pain and SOB.  Sent from urgent care with abnormal EKG. Starting heparin gtt for rule out ACS. Trop + CBC wnl.  Patient taken urgently to cath found to have multivessel CAD. IABP placed, surgery to evaluate for CABG. Heparin restarted with stop time of 0600 for femoral sheath removal.   Goal of Therapy:  Heparin level 0.3-0.7 units/ml Monitor platelets by anticoagulation protocol: Yes   Plan:  Restart heparin at 800 units/hr Heparin level at 0400 prior to turning gtt off  Erin Hearing PharmD., BCPS Clinical Pharmacist 06/29/2018 8:28 PM

## 2018-06-29 NOTE — ED Triage Notes (Signed)
Pt to ER for evaluation of chest pain and shortness of breath, referred by [facility], reportedly was driving home to [facility] when he developed chest tightness and shortness of breath. Received neb treatment at [facility]. Received 324 mg aspirin and 1 nitro, pain down from 2/10 to 0/10.  Pt is a/o x4.

## 2018-06-29 NOTE — Progress Notes (Signed)
1628 Pt complain of shortness of breath, chest pain 10/10 and became acutely diaphoretic and tachypneic with RR 40s.1 nitro given with no relief. Rapid response notified. SBP 188. Unable to perform EKG due to restlessness of patient and labored breathing. BiPAP initiated by RT. Morphine given by RR nurse. Dr Harrell Gave notified and pt was immediately transferred to cath lab.

## 2018-06-29 NOTE — H&P (Signed)
Cardiology Admission History and Physical:   Patient ID: Randy [name] MRN: [id number]; DOB: July 31, 1945   Admission date: 06/29/2018  Primary Care Provider: None. Primary Cardiologist: Patient lives in [place]. Primary Electrophysiologist:  None   Chief Complaint:  Chest Pain  Patient Profile:   Randy [name] is a 72 y.o. male with a history of hypertension, tobacco abuse with a 50 year smoking history, and alcohol abuse but no known cardiac history who presents for evaluation of chest pain.   History of Present Illness:   Randy [name] is a 72 year old male with a history of hypertension, tobacco abuse with a 50 year smoking history, and alcohol abuse but no known cardiac history who presents to the [facility] ED for evaluation of chest pain. Patient reports having an episode of chest pain and diaphoresis about 20 years ago which was reportedly felt to be secondary to hypertension and alcoholism. He reports having a stress test after that episode which he believes was normal. He was also started on an antihypertensive at that time but stopped taking it because he did not notice a difference in how it made him feel. He states he has not been to the doctor since that time. Patient lives in [place] and was in [place] visiting family.   Patient reports worsening shortness of breath with exertion over the last year to the point where he has to stop and catch his breath after minimal exertion such as walking a short distance or lifting something. Patient reports feeling well when he woke up this morning. He was carrying his luggage to his car around 6:30am when he got very short of breath. He got in the car to drive back to [place] but had to pull over because he was having trouble breathing. He states he could not catch his breath and he felt anxious and "closed in." He drove back to his brother's house and got his breathing under control for a few minutes and then got back in the car. However, his  breathing worsened and he felt like he was hyperventilating. He then developed chest tightness across his entire chest with associated diaphoresis, dizziness, and tingling in his hands. He denies any associated nausea, vomiting, or palpitations. He ended up driving himself to the Urgent Care and was found to have an abnormal EKG. He was sent to the ED for further evaluation.   Upon arrival to the ED, patient hypertensive but vitals stable. EKG showed sinus tachycardia, rate 101 bpm, with PVCs and T wave inversions in leads V5 and V6. Initial troponin elevated at 6.71. Chest x-ray showed bilateral interstitial pulmonary edema. D-dimer elevated at 0.93. WBC 18.6, Hgb 16.4, Plts 257. Na 139, K 4.2, Glucose 116, SCr 1.18. Patient received albuterol treatment, Aspirin, and Nitroglycerin in the ED with resolution of chest pain.   Currently, patient is chest pain free. He denies any orthopnea, PND, or lower extremity edema. He also denies any recent fever or illnesses.  Patient has a 50 year smoking history and reports smoking 1/2 pack per day. He also has a history of alcohol use disorder with his last drink being 10 years ago. He has a family history of CAD with his father dying of a "massive heart attack" in his mid 94's.   Past Medical History:  Diagnosis Date  . Alcohol abuse    Last drink was 10 years ago (2009).  Marland Kitchen Hypertension   . Tobacco abuse     History reviewed. No pertinent surgical  history.   Medications Prior to Admission: Prior to Admission medications   Not on File     Allergies:   No Known Allergies  Social History:   Social History   Socioeconomic History  . Marital status: Unknown    Spouse name: Not on file  . Number of children: Not on file  . Years of education: Not on file  . Highest education level: Not on file  Occupational History  . Not on file  Social Needs  . Financial resource strain: Not on file  . Food insecurity:    Worry: Not on file    Inability:  Not on file  . Transportation needs:    Medical: Not on file    Non-medical: Not on file  Tobacco Use  . Smoking status: Not on file  Substance and Sexual Activity  . Alcohol use: Not on file  . Drug use: Not on file  . Sexual activity: Not on file  Lifestyle  . Physical activity:    Days per week: Not on file    Minutes per session: Not on file  . Stress: Not on file  Relationships  . Social connections:    Talks on phone: Not on file    Gets together: Not on file    Attends religious service: Not on file    Active member of club or organization: Not on file    Attends meetings of clubs or organizations: Not on file    Relationship status: Not on file  . Intimate partner violence:    Fear of current or ex partner: Not on file    Emotionally abused: Not on file    Physically abused: Not on file    Forced sexual activity: Not on file  Other Topics Concern  . Not on file  Social History Narrative  . Not on file    Family History:   The patient's family history includes CAD in his father. There is no history of Stroke or Sudden death.    ROS:  Please see the history of present illness.  Review of Systems  Constitutional: Positive for diaphoresis. Negative for chills and fever.  HENT: Negative for congestion and sore throat.   Eyes: Negative for blurred vision and double vision.  Respiratory: Positive for shortness of breath. Negative for cough and hemoptysis.   Cardiovascular: Positive for chest pain. Negative for palpitations, orthopnea, leg swelling and PND.  Gastrointestinal: Positive for abdominal pain. Negative for blood in stool, nausea and vomiting.  Genitourinary: Negative for hematuria.  Musculoskeletal: Negative for myalgias.  Neurological: Positive for dizziness and tingling. Negative for loss of consciousness.  Endo/Heme/Allergies: Does not bruise/bleed easily.  Psychiatric/Behavioral: Positive for substance abuse (tobacco and alcohol abuse).    Physical  Exam/Data:   Vitals:   06/29/18 1130 06/29/18 1145 06/29/18 1215 06/29/18 1230  BP: (!) 152/88 (!) 158/98 (!) 165/101 (!) 152/106  Pulse: 91 95 91 88  Resp: (!) 21 16 (!) 22 16  Temp:      TempSrc:      SpO2: 97% 96% 98% 96%  Weight:      Height:       No intake or output data in the 24 hours ending 06/29/18 1257 Filed Weights   06/29/18 1102  Weight: 63.5 kg   Body mass index is 24.03 kg/m.  General:  Well nourished, well developed 72 year old Caucasian male resting comfortably in no acute distress. HEENT: Head normocephalic and atraumatic. EOMs intact. Mild arcus  senilis noted bilaterally. No xanthomas.  Lymph: No adenopathy. Neck: Supple. No JVD. Endocrine:  No thyromegaly. Vascular: No carotid bruits. Radial pulses and distal pedal pulses 2+ and equal bilaterally. Cardiac:  RRR. Distinct S1 and S2. II/VI systolic murmur best heard at apex. Lungs: No increased work of breathing. Mild crackles noted in bilateral bases.  Abd: Abdomen soft, non-tender, and non-distended. Bowel sounds present. Ext: No lower extremity edema. Musculoskeletal:  No deformities. BUE and BLE strength normal and equal Skin: Warm and dry. Neuro:  No focal deficits noted. Psych:  Normal affect. Pleasant and cooperative.    EKG:  The ECG that was done was personally reviewed and demonstrates sinus tachycardia, rate 101 bpm, with PVCs and T wave inversions in leads V5 and V6 (no prior tracings available for comparison).  Telemetry: Telemetry was personally reviewed and demonstrates sinus rhythm with heart rate between 80's and low 100's and frequent PVCs.  Relevant CV Studies: None.  Laboratory Data:  Chemistry Recent Labs  Lab 06/29/18 1035  NA 139  K 4.2  CL 106  CO2 22  GLUCOSE 116*  BUN 16  CREATININE 1.18  CALCIUM 8.8*  GFRNONAA >60  GFRAA >60  ANIONGAP 11    Recent Labs  Lab 06/29/18 1035  PROT 6.8  ALBUMIN 4.1  AST 85*  ALT 20  ALKPHOS 38  BILITOT 1.1    Hematology Recent Labs  Lab 06/29/18 1035  WBC 18.6*  RBC 5.44  HGB 16.4  HCT 50.8  MCV 93.4  MCH 30.1  MCHC 32.3  RDW 13.7  PLT 257   Cardiac EnzymesNo results for input(s): TROPONINI in the last 168 hours.  Recent Labs  Lab 06/29/18 1040  TROPIPOC 6.71*    BNPNo results for input(s): BNP, PROBNP in the last 168 hours.  DDimer  Recent Labs  Lab 06/29/18 1035  DDIMER 0.93*    Radiology/Studies:  Dg Chest Port 1 View  Result Date: 06/29/2018 CLINICAL DATA:  Shortness of breath. Chest pain. EXAM: PORTABLE CHEST 1 VIEW COMPARISON:  None. FINDINGS: The patient has bilateral mild interstitial pulmonary edema. Heart size and pulmonary vascularity are within normal limits. No discrete effusions. Slight compression deformity of T7, age indeterminate. IMPRESSION: Bilateral interstitial pulmonary edema. Compression fracture of T7, age indeterminate. Electronically Signed   By: Lorriane Shire M.D.   On: 06/29/2018 11:25    Assessment and Plan:   NSTEMI - Patient presents to ED for evaluation of chest pain and shortness of breath. - EKG showed sinus tachycardia, rate 101 bpm, with PVCs, Q waves in inferior leads, and T wave inversion in leads V5 and V6. - Initial troponin 6.71. Will continue to trend. - D-dimer elevated at 0.93. Will get Chest CTA to rule out pulmonary embolism. - Will check BNP. - Will check Echo. - Will check lipid panel and Hgb A1c. - Patient is currently chest pain free.  - IV Heparin drip started in ED. - Will start Aspirin, statin, and low dose beta blocker.  - Patient is scheduled for left heart catheterization tomorrow. Orders placed. The patient understands that risks include but are not limited to stroke (1 in 1000), death (1 in 61), kidney failure [usually temporary] (1 in 500), bleeding (1 in 200), allergic reaction [possibly serious] (1 in 200), and agrees to proceed.   Hypertension - Most recent BP 152/106. - Patient previously on an  antihypertensive about 20 years ago but stopped taking it because he did not notice a difference in how he felt. -  Will continue to monitor BP. Consider adding ACEi/ARB.    Hyperlipidemia - Lipid panel today: Cholesterol 250, Triglyceride 62, HDL 69, LDL 169. - Start Lipitor '80mg'$  daily.  - AST mildly elevated at 85. ALT, Alk Phos, and total bilirubin normal. Patient does have a history of alcohol abuse. - Will need repeat lipid panel and CMP in 4-6 weeks.     Severity of Illness: The appropriate patient status for this patient is INPATIENT. Inpatient status is judged to be reasonable and necessary in order to provide the required intensity of service to ensure the patient's safety. The patient's presenting symptoms, physical exam findings, and initial radiographic and laboratory data in the context of their chronic comorbidities is felt to place them at high risk for further clinical deterioration. Furthermore, it is not anticipated that the patient will be medically stable for discharge from the hospital within 2 midnights of admission. The following factors support the patient status of inpatient.   " The patient's presenting symptoms include chest pain and shortness of breath. " The worrisome physical exam findings include bilateral crackles and murmur. " The initial radiographic and laboratory data are worrisome because of elevated troponin, abnormal EKG, and pulmonary edema on chest x-ray. " The chronic co-morbidities include hypertension.   * I certify that at the point of admission it is my clinical judgment that the patient will require inpatient hospital care spanning beyond 2 midnights from the point of admission due to high intensity of service, high risk for further deterioration and high frequency of surveillance required.*    For questions or updates, please contact [place] Please consult [email or web address] for contact info under        Signed, Darreld Mclean, PA-C   06/29/2018 12:57 PM

## 2018-06-30 ENCOUNTER — Other Ambulatory Visit: Payer: Self-pay | Admitting: *Deleted

## 2018-06-30 ENCOUNTER — Inpatient Hospital Stay (HOSPITAL_COMMUNITY): Payer: Medicare PPO

## 2018-06-30 ENCOUNTER — Encounter (HOSPITAL_COMMUNITY): Payer: Self-pay | Admitting: Cardiovascular Disease

## 2018-06-30 DIAGNOSIS — Z9889 Other specified postprocedural states: Secondary | ICD-10-CM

## 2018-06-30 DIAGNOSIS — Z01810 Encounter for preprocedural cardiovascular examination: Secondary | ICD-10-CM

## 2018-06-30 DIAGNOSIS — I25110 Atherosclerotic heart disease of native coronary artery with unstable angina pectoris: Secondary | ICD-10-CM

## 2018-06-30 DIAGNOSIS — I2510 Atherosclerotic heart disease of native coronary artery without angina pectoris: Secondary | ICD-10-CM

## 2018-06-30 DIAGNOSIS — I361 Nonrheumatic tricuspid (valve) insufficiency: Secondary | ICD-10-CM

## 2018-06-30 LAB — CBC
HCT: 44.7 % (ref 39.0–52.0)
Hemoglobin: 14.6 g/dL (ref 13.0–17.0)
MCH: 29.8 pg (ref 26.0–34.0)
MCHC: 32.7 g/dL (ref 30.0–36.0)
MCV: 91.2 fL (ref 80.0–100.0)
Platelets: 223 10*3/uL (ref 150–400)
RBC: 4.9 MIL/uL (ref 4.22–5.81)
RDW: 13.8 % (ref 11.5–15.5)
WBC: 10.1 10*3/uL (ref 4.0–10.5)
nRBC: 0 % (ref 0.0–0.2)

## 2018-06-30 LAB — URINALYSIS, ROUTINE W REFLEX MICROSCOPIC
Bilirubin Urine: NEGATIVE
Glucose, UA: NEGATIVE mg/dL
Ketones, ur: 20 mg/dL — AB
Leukocytes, UA: NEGATIVE
Nitrite: NEGATIVE
Protein, ur: NEGATIVE mg/dL
Specific Gravity, Urine: 1.036 — ABNORMAL HIGH (ref 1.005–1.030)
pH: 5 (ref 5.0–8.0)

## 2018-06-30 LAB — COMPREHENSIVE METABOLIC PANEL
ALT: 27 U/L (ref 0–44)
AST: 158 U/L — ABNORMAL HIGH (ref 15–41)
Albumin: 3.5 g/dL (ref 3.5–5.0)
Alkaline Phosphatase: 32 U/L — ABNORMAL LOW (ref 38–126)
Anion gap: 14 (ref 5–15)
BUN: 16 mg/dL (ref 8–23)
CO2: 17 mmol/L — ABNORMAL LOW (ref 22–32)
Calcium: 8.7 mg/dL — ABNORMAL LOW (ref 8.9–10.3)
Chloride: 110 mmol/L (ref 98–111)
Creatinine, Ser: 1.01 mg/dL (ref 0.61–1.24)
GFR calc Af Amer: >60 mL/min (ref >60)
GFR calc non Af Amer: >60 mL/min (ref >60)
Glucose, Bld: 92 mg/dL (ref 70–99)
Potassium: 4.1 mmol/L (ref 3.5–5.1)
Sodium: 141 mmol/L (ref 135–145)
Total Bilirubin: 1.5 mg/dL — ABNORMAL HIGH (ref 0.3–1.2)
Total Protein: 5.9 g/dL — ABNORMAL LOW (ref 6.5–8.1)

## 2018-06-30 LAB — PULMONARY FUNCTION TEST
FEF 25-75 Post: 1.06 L/sec
FEF 25-75 Pre: 0.48 L/sec
FEF2575-%Change-Post: 120 %
FEF2575-%Pred-Post: 56 %
FEF2575-%Pred-Pre: 25 %
FEV1-%Change-Post: 27 %
FEV1-%Pred-Post: 54 %
FEV1-%Pred-Pre: 43 %
FEV1-Post: 1.36 L
FEV1-Pre: 1.07 L
FEV1FVC-%Change-Post: 13 %
FEV1FVC-%Pred-Pre: 81 %
FEV6-%Change-Post: 16 %
FEV6-%Pred-Post: 61 %
FEV6-%Pred-Pre: 52 %
FEV6-Post: 1.96 L
FEV6-Pre: 1.69 L
FEV6FVC-%Change-Post: 4 %
FEV6FVC-%Pred-Post: 105 %
FEV6FVC-%Pred-Pre: 101 %
FVC-%Change-Post: 11 %
FVC-%Pred-Post: 58 %
FVC-%Pred-Pre: 52 %
FVC-Post: 2 L
FVC-Pre: 1.79 L
Post FEV1/FVC ratio: 68 %
Post FEV6/FVC ratio: 99 %
Pre FEV1/FVC ratio: 60 %
Pre FEV6/FVC Ratio: 95 %

## 2018-06-30 LAB — POCT I-STAT 3, ART BLOOD GAS (G3+)
Acid-base deficit: 5 mmol/L — ABNORMAL HIGH (ref 0.0–2.0)
Bicarbonate: 23.6 mmol/L (ref 20.0–28.0)
O2 Saturation: 100 %
PO2 ART: 375 mmHg — AB (ref 83.0–108.0)
TCO2: 25 mmol/L (ref 22–32)
pCO2 arterial: 54.6 mmHg — ABNORMAL HIGH (ref 32.0–48.0)
pH, Arterial: 7.244 — ABNORMAL LOW (ref 7.350–7.450)

## 2018-06-30 LAB — TROPONIN I: Troponin I: 42.02 ng/mL (ref <0.03)

## 2018-06-30 LAB — ECHOCARDIOGRAM COMPLETE
Height: 63 in
Weight: 2490.32 oz

## 2018-06-30 LAB — TYPE AND SCREEN
ABO/RH(D): A POS
Antibody Screen: NEGATIVE

## 2018-06-30 LAB — ABO/RH: ABO/RH(D): A POS

## 2018-06-30 LAB — POCT ACTIVATED CLOTTING TIME
ACTIVATED CLOTTING TIME: 158 s
Activated Clotting Time: 235 seconds

## 2018-06-30 LAB — PLATELET INHIBITION P2Y12: Platelet Function  P2Y12: 208 [PRU] (ref 194–418)

## 2018-06-30 LAB — APTT: aPTT: 49 seconds — ABNORMAL HIGH (ref 24–36)

## 2018-06-30 LAB — PROTIME-INR
INR: 1.21
Prothrombin Time: 15.2 seconds (ref 11.4–15.2)

## 2018-06-30 LAB — HEPARIN LEVEL (UNFRACTIONATED): Heparin Unfractionated: 0.49 IU/mL (ref 0.30–0.70)

## 2018-06-30 LAB — BRAIN NATRIURETIC PEPTIDE: B Natriuretic Peptide: 1369.9 pg/mL — ABNORMAL HIGH (ref 0.0–100.0)

## 2018-06-30 MED ORDER — DOPAMINE-DEXTROSE 3.2-5 MG/ML-% IV SOLN
0.0000 ug/kg/min | INTRAVENOUS | Status: AC
  Administered 2018-07-01: 3 ug/kg/min via INTRAVENOUS
  Filled 2018-06-30: qty 250

## 2018-06-30 MED ORDER — SODIUM CHLORIDE 0.9 % IV SOLN
1.5000 g | INTRAVENOUS | Status: AC
  Administered 2018-07-01: 1.5 g via INTRAVENOUS
  Filled 2018-06-30: qty 1.5

## 2018-06-30 MED ORDER — CHLORHEXIDINE GLUCONATE CLOTH 2 % EX PADS
6.0000 | MEDICATED_PAD | Freq: Once | CUTANEOUS | Status: AC
  Administered 2018-07-01: 6 via TOPICAL

## 2018-06-30 MED ORDER — NITROGLYCERIN IN D5W 200-5 MCG/ML-% IV SOLN
0.0000 ug/min | INTRAVENOUS | Status: DC
  Administered 2018-06-30: 10 ug/min via INTRAVENOUS
  Administered 2018-07-01: 100 ug/min via INTRAVENOUS
  Filled 2018-06-30 (×2): qty 250

## 2018-06-30 MED ORDER — SODIUM CHLORIDE 0.9 % IV SOLN
INTRAVENOUS | Status: DC
  Filled 2018-06-30: qty 30

## 2018-06-30 MED ORDER — INSULIN REGULAR(HUMAN) IN NACL 100-0.9 UT/100ML-% IV SOLN
INTRAVENOUS | Status: AC
  Administered 2018-07-01: 1 [IU]/h via INTRAVENOUS
  Filled 2018-06-30: qty 100

## 2018-06-30 MED ORDER — TRANEXAMIC ACID (OHS) PUMP PRIME SOLUTION
2.0000 mg/kg | INTRAVENOUS | Status: DC
  Filled 2018-06-30: qty 1.41

## 2018-06-30 MED ORDER — BISACODYL 5 MG PO TBEC
5.0000 mg | DELAYED_RELEASE_TABLET | Freq: Once | ORAL | Status: AC
  Administered 2018-06-30: 5 mg via ORAL
  Filled 2018-06-30: qty 1

## 2018-06-30 MED ORDER — MILRINONE LACTATE IN DEXTROSE 20-5 MG/100ML-% IV SOLN
0.3000 ug/kg/min | INTRAVENOUS | Status: AC
  Administered 2018-07-01: 0.375 ug/kg/min via INTRAVENOUS
  Filled 2018-06-30: qty 100

## 2018-06-30 MED ORDER — MAGNESIUM SULFATE 50 % IJ SOLN
40.0000 meq | INTRAMUSCULAR | Status: DC
  Filled 2018-06-30: qty 9.85

## 2018-06-30 MED ORDER — TEMAZEPAM 15 MG PO CAPS
15.0000 mg | ORAL_CAPSULE | Freq: Once | ORAL | Status: AC | PRN
  Administered 2018-06-30: 15 mg via ORAL
  Filled 2018-06-30: qty 1

## 2018-06-30 MED ORDER — TRANEXAMIC ACID (OHS) BOLUS VIA INFUSION
15.0000 mg/kg | INTRAVENOUS | Status: AC
  Administered 2018-07-01: 1059 mg via INTRAVENOUS
  Filled 2018-06-30: qty 1059

## 2018-06-30 MED ORDER — PLASMA-LYTE 148 IV SOLN
INTRAVENOUS | Status: DC
  Filled 2018-06-30: qty 2.5

## 2018-06-30 MED ORDER — PHENYLEPHRINE HCL-NACL 20-0.9 MG/250ML-% IV SOLN
30.0000 ug/min | INTRAVENOUS | Status: DC
  Filled 2018-06-30: qty 250

## 2018-06-30 MED ORDER — SODIUM CHLORIDE 0.9 % IV SOLN
750.0000 mg | INTRAVENOUS | Status: AC
  Administered 2018-07-01: 750 mg via INTRAVENOUS
  Filled 2018-06-30: qty 750

## 2018-06-30 MED ORDER — HEPARIN (PORCINE) 25000 UT/250ML-% IV SOLN
750.0000 [IU]/h | INTRAVENOUS | Status: DC
  Administered 2018-06-30 (×2): 750 [IU]/h via INTRAVENOUS
  Filled 2018-06-30: qty 250

## 2018-06-30 MED ORDER — ORAL CARE MOUTH RINSE
15.0000 mL | Freq: Two times a day (BID) | OROMUCOSAL | Status: DC
  Administered 2018-06-30: 15 mL via OROMUCOSAL

## 2018-06-30 MED ORDER — POTASSIUM CHLORIDE 2 MEQ/ML IV SOLN
80.0000 meq | INTRAVENOUS | Status: DC
  Filled 2018-06-30: qty 40

## 2018-06-30 MED ORDER — CHLORHEXIDINE GLUCONATE 0.12 % MT SOLN
15.0000 mL | Freq: Once | OROMUCOSAL | Status: AC
  Administered 2018-07-01: 15 mL via OROMUCOSAL
  Filled 2018-06-30: qty 15

## 2018-06-30 MED ORDER — NOREPINEPHRINE 4 MG/250ML-% IV SOLN
0.0000 ug/min | INTRAVENOUS | Status: DC
  Filled 2018-06-30: qty 250

## 2018-06-30 MED ORDER — TRANEXAMIC ACID 1000 MG/10ML IV SOLN
1.5000 mg/kg/h | INTRAVENOUS | Status: AC
  Administered 2018-07-01: 1.5 mg/kg/h via INTRAVENOUS
  Filled 2018-06-30: qty 25

## 2018-06-30 MED ORDER — CHLORHEXIDINE GLUCONATE CLOTH 2 % EX PADS: 6.0000 | MEDICATED_PAD | Freq: Once | CUTANEOUS | Status: AC

## 2018-06-30 MED ORDER — NITROGLYCERIN IN D5W 200-5 MCG/ML-% IV SOLN
2.0000 ug/min | INTRAVENOUS | Status: DC
  Filled 2018-06-30: qty 250

## 2018-06-30 MED ORDER — EPINEPHRINE PF 1 MG/ML IJ SOLN
0.0000 ug/min | INTRAVENOUS | Status: DC
  Filled 2018-06-30: qty 4

## 2018-06-30 MED ORDER — ALBUTEROL SULFATE (2.5 MG/3ML) 0.083% IN NEBU
2.5000 mg | INHALATION_SOLUTION | Freq: Once | RESPIRATORY_TRACT | Status: AC
  Administered 2018-06-30: 2.5 mg via RESPIRATORY_TRACT

## 2018-06-30 MED ORDER — METOPROLOL TARTRATE 12.5 MG HALF TABLET
12.5000 mg | ORAL_TABLET | Freq: Once | ORAL | Status: AC
  Administered 2018-07-01: 12.5 mg via ORAL
  Filled 2018-06-30: qty 1

## 2018-06-30 MED ORDER — DEXMEDETOMIDINE HCL IN NACL 400 MCG/100ML IV SOLN
0.1000 ug/kg/h | INTRAVENOUS | Status: AC
  Administered 2018-07-01: .5 ug/kg/h via INTRAVENOUS
  Filled 2018-06-30: qty 100

## 2018-06-30 MED ORDER — VANCOMYCIN HCL 10 G IV SOLR
1250.0000 mg | INTRAVENOUS | Status: AC
  Administered 2018-07-01: 1250 mg via INTRAVENOUS
  Filled 2018-06-30: qty 1250

## 2018-06-30 NOTE — Plan of Care (Signed)
Open heart surgery video shown to pt and family

## 2018-06-30 NOTE — Progress Notes (Signed)
ANTICOAGULATION CONSULT NOTE  Pharmacy Consult for heparin Indication: chest pain/ACS, acute PE   Patient Measurements: Height: 5\' 3"  (160 cm) Weight: 155 lb 10.3 oz (70.6 kg) IBW/kg (Calculated) : 56.9 Heparin Dosing Weight: 63.5 kg  Vital Signs: Temp: 98.4 F (36.9 C) (12/17 0724) Temp Source: Oral (12/17 0724) BP: 144/102 (12/17 0800) Pulse Rate: 65 (12/17 0830)  Labs: Recent Labs    06/29/18 1035 06/29/18 1344 06/29/18 2010 06/30/18 0318  HGB 16.4  --   --  14.6  HCT 50.8  --   --  44.7  PLT 257  --   --  223  APTT 29  --   --   --   LABPROT 13.2  --   --  15.2  INR 1.01  --   --  1.21  HEPARINUNFRC  --   --   --  0.49  CREATININE 1.18  --   --  1.01  TROPONINI  --  30.55* 46.33* 42.02*     Assessment: 72 [name] admitted with chest pain and SOB, taken urgently to cath and found to have severe multivessel CAD. IABP placed, surgery evaluating for CABG. Heparin restarted post-cath with stop time of 0600 for femoral sheath removal - sheath removed at 0630 per discussion with RN. No active bleed issues reported. Pharmacy consulted to resume heparin 6 hours post-sheath removal for ACS + CTA showed small bilateral PE. CBC wnl. CVTS planning CABG once stabilized.  Heparin level previously therapeutic at 0.49 on 800 units/hr. Will target lower goal per protocol while IABP in place.  Goal of Therapy:  Heparin level 0.2-0.5 units/ml with IABP Monitor platelets by anticoagulation protocol: Yes   Plan:  No bolus. Resume heparin at 750 units/hr at 1230 (6 hrs post-sheath removal) 8h heparin level Monitor daily heparin level and CBC, s/sx bleeding CABG planned, potentially 12/18 per CVTS  Elicia Lamp, PharmD, BCPS Clinical Pharmacist Clinical phone [phone] Please check AMION for all [place] contact numbers 06/30/2018 9:03 AM

## 2018-06-30 NOTE — Plan of Care (Signed)
Unable to get educational booklet about OHS from other floors or pharmacy. Floor stock has been ordered but will not be here by today.

## 2018-06-30 NOTE — Progress Notes (Signed)
ANTICOAGULATION CONSULT NOTE  Pharmacy Consult for heparin Indication: chest pain/ACS   Patient Measurements: Height: 5\' 3"  (160 cm) Weight: 155 lb 10.3 oz (70.6 kg) IBW/kg (Calculated) : 56.9 Heparin Dosing Weight: 63.5 kg  Vital Signs: Temp: 98.1 F (36.7 C) (12/17 0000) Temp Source: Oral (12/17 0000) BP: 140/78 (12/17 0400) Pulse Rate: 61 (12/17 0400)  Labs: Recent Labs    06/29/18 1035 06/29/18 1344 06/29/18 2010 06/30/18 0318  HGB 16.4  --   --  14.6  HCT 50.8  --   --  44.7  PLT 257  --   --  223  APTT 29  --   --   --   LABPROT 13.2  --   --   --   INR 1.01  --   --   --   HEPARINUNFRC  --   --   --  0.49  CREATININE 1.18  --   --   --   TROPONINI  --  30.55* 46.33*  --      Assessment: 72 yo admitted with chest pain and SOB.  Sent from urgent care with abnormal EKG. Starting heparin gtt for rule out ACS. Trop + CBC wnl.  Patient taken urgently to cath found to have multivessel CAD. IABP placed, surgery to evaluate for CABG. Heparin restarted with stop time of 0600 for femoral sheath removal.  Initial heparin level 0.49 units/ml  Goal of Therapy:  Heparin level 0.3-0.7 units/ml Monitor platelets by anticoagulation protocol: Yes   Plan Cont heparin at 800 units/hr F/u for restart after sheath removal  Excell Seltzer PharmD Clinical Pharmacist 06/30/2018 4:24 AM

## 2018-06-30 NOTE — Progress Notes (Signed)
Femoral sheath pulled. Pressure held for 20 minutes. Level 0 at site. No complications during procedure. Patient tolerated well.

## 2018-06-30 NOTE — Anesthesia Preprocedure Evaluation (Addendum)
Anesthesia Evaluation  Patient identified by MRN, date of birth, ID band Patient awake    Reviewed: Allergy & Precautions, NPO status , Patient's Chart, lab work & pertinent test results  History of Anesthesia Complications Negative for: history of anesthetic complications  Airway Mallampati: II  TM Distance: >3 FB Neck ROM: Full    Dental  (+) Dental Advisory Given   Pulmonary former smoker, PE   breath sounds clear to auscultation       Cardiovascular hypertension, + CAD, + Past MI and +CHF   Rhythm:Regular Rate:Normal + Systolic murmurs  '19 TTE - LV cavity size was mildly dilated. There was   moderate concentric hypertrophy. EF 40% to 45%. Grade 1 diastolic dysfunction. Moderate MR, directed posteriorly. Mild TR.  '19 Cath - Mid RCA lesion is 100% stenosed. Mid LM to Dist LM lesion is 30% stenosed. Prox LAD lesion is 100% stenosed. Ost 2nd Mrg lesion is 100% stenosed. LV end diastolic pressure is moderately elevated. There is moderate to severe left ventricular systolic dysfunction.  1.  Severe three-vessel coronary artery disease likely with recent occlusion of the RCA 2.  Total occlusion of the mid RCA, proximal LAD, and second obtuse marginal, all vessels supplied by collateral branches 3.  Moderately severe LV systolic dysfunction with LVEF estimated at 35% with akinesis of the anterolateral wall and basal inferior walls 4.  Diffuse aortoiliac disease without evidence of abdominal aortic aneurysm 5.  Successful placement of an intra-aortic balloon pump via right femoral artery access  '19 Carotid US - No significant ICAS    Neuro/Psych PSYCHIATRIC DISORDERS negative neurological ROS     GI/Hepatic negative GI ROS, (+)     substance abuse  alcohol use,   Endo/Other  negative endocrine ROS  Renal/GU negative Renal ROS     Musculoskeletal negative musculoskeletal ROS (+)   Abdominal   Peds  Hematology negative hematology ROS (+)   Anesthesia Other Findings   Reproductive/Obstetrics                           Anesthesia Physical Anesthesia Plan  ASA: IV  Anesthesia Plan: General   Post-op Pain Management:    Induction: Intravenous  PONV Risk Score and Plan: 2 and Treatment may vary due to age or medical condition, Ondansetron and Midazolam  Airway Management Planned: Oral ETT  Additional Equipment: CVP, PA Cath, Arterial line, TEE and Ultrasound Guidance Line Placement  Intra-op Plan:   Post-operative Plan: Post-operative intubation/ventilation  Informed Consent: I have reviewed the patients History and Physical, chart, labs and discussed the procedure including the risks, benefits and alternatives for the proposed anesthesia with the patient or authorized representative who has indicated his/her understanding and acceptance.   Dental advisory given  Plan Discussed with: CRNA and Anesthesiologist  Anesthesia Plan Comments:        Anesthesia Quick Evaluation

## 2018-06-30 NOTE — Progress Notes (Addendum)
      [address]       [place] [address]             [phone]      Patient remains pain free, respiratory status improved, IAB in place I have discussed with patient proceeding with CABG in am The goals risks and alternatives of the planned surgical procedure CABG have been discussed with the patient in detail. The risks of the procedure including death, infection, stroke, myocardial infarction, bleeding, blood transfusion have all been discussed specifically.  I have quoted Jobe Marker a 2% of perioperative mortality and a complication rate as high as 40 %. The patient's questions have been answered.Jobe Marker is willing  to proceed with the planned procedure.   Grace Isaac MD      [place] ,[place] [address] Office [phone]   [place]

## 2018-06-30 NOTE — Progress Notes (Signed)
  Echocardiogram 2D Echocardiogram has been performed.  Randy [name] 06/30/2018, 9:07 AM

## 2018-06-30 NOTE — Progress Notes (Signed)
Pre CABG evaluation completed.  Please see preliminary notes on CV PROC under chart review.  Yolinda Duerr H Claudie Brickhouse(RDMS RVT) 06/30/18 11:54 AM

## 2018-06-30 NOTE — Progress Notes (Signed)
Discussed sternal precautions, IS (RN reports 1750 mL), mobility post op, and d/c planning. Pts family present and very supportive, all were receptive. They are making plans for d/c but will be able to be with him. Gave OHS booklet and careguide and Move in the Tube sheet. [place] 3:26 PM 06/30/2018

## 2018-06-30 NOTE — Plan of Care (Signed)
  Problem: Education: Goal: Knowledge of General Education information will improve Description Including pain rating scale, medication(s)/side effects and non-pharmacologic comfort measures Outcome: Progressing   Problem: Health Behavior/Discharge Planning: Goal: Ability to manage health-related needs will improve Outcome: Progressing   Problem: Education: Goal: Understanding of CV disease, CV risk reduction, and recovery process will improve Outcome: Progressing Goal: Individualized Educational Video(s) Outcome: Progressing   Problem: Cardiovascular: Goal: Ability to achieve and maintain adequate cardiovascular perfusion will improve Outcome: Progressing Goal: Vascular access site(s) Level 0-1 will be maintained Outcome: Progressing

## 2018-06-30 NOTE — Progress Notes (Signed)
Patient is having his breakfast. He said he needs 15 minutes to finish his meal. Will come back later. Randy [name] H [name](RDMS RVT) 06/30/18 9:58 AM

## 2018-06-30 NOTE — Progress Notes (Signed)
Progress Note  Patient Name: Randy [name] Date of Encounter: 06/30/2018  Primary Cardiologist: Buford Dresser, MD, PhD (new)--though lives in [place]  Subjective   Feeling much improved this AM. On nasal cannula. Tolerating balloon pump without issue. Left femoral sheath removed this AM. Reviewed results of cath again, plan for CABG. No further chest pain or shortness of breath. He plans to quit smoking. Declined patch today.  Inpatient Medications    Scheduled Meds: . aspirin EC  81 mg Oral Daily  . atorvastatin  80 mg Oral q1800  . chlorhexidine  15 mL Mouth Rinse BID  . [START ON 07/01/2018] Influenza vac split quadrivalent PF  0.5 mL Intramuscular Tomorrow-1000  . mouth rinse  15 mL Mouth Rinse q12n4p  . metoprolol tartrate  12.5 mg Oral BID  .  morphine injection  2 mg Intravenous Once  . sodium chloride flush  3 mL Intravenous Q12H   Continuous Infusions: . sodium chloride 10 mL/hr at 06/30/18 0800   PRN Meds: sodium chloride, acetaminophen, nitroGLYCERIN, ondansetron (ZOFRAN) IV, [START ON 07/01/2018] pneumococcal 23 valent vaccine, sodium chloride flush   Vital Signs    Vitals:   06/30/18 0724 06/30/18 0730 06/30/18 0800 06/30/18 0830  BP:   (!) 144/102   Pulse:  76 63 65  Resp:  (!) 22 20 16   Temp: 98.4 F (36.9 C)     TempSrc: Oral     SpO2:  99% 99% 97%  Weight:      Height:        Intake/Output Summary (Last 24 hours) at 06/30/2018 0850 Last data filed at 06/30/2018 0800 Gross per 24 hour  Intake 285.92 ml  Output 450 ml  Net -164.08 ml   Filed Weights   06/29/18 1102 06/29/18 1558 06/29/18 1940  Weight: 63.5 kg 68.7 kg 70.6 kg    Telemetry    NSR - Personally Reviewed  ECG    SR, inferior Q waves, lateral ST depressions, PVCs, poor R wave progression - Personally Reviewed  Physical Exam   GEN: No acute distress.  [place] in place Neck: No JVD Cardiac: RRR, no murmurs, rubs, or gallops. Radial cath side and bilateral groin sites  dressed, c/d/i. Some dried blood on R femoral IABP site but no active bleeding. Respiratory: Clear to auscultation bilaterally. GI: Soft, nontender, non-distended  MS: No edema; No deformity. Neuro:  Nonfocal  Psych: Normal affect   Labs    Chemistry Recent Labs  Lab 06/29/18 1035 06/30/18 0318  NA 139 141  K 4.2 4.1  CL 106 110  CO2 22 17*  GLUCOSE 116* 92  BUN 16 16  CREATININE 1.18 1.01  CALCIUM 8.8* 8.7*  PROT 6.8 5.9*  ALBUMIN 4.1 3.5  AST 85* 158*  ALT 20 27  ALKPHOS 38 32*  BILITOT 1.1 1.5*  GFRNONAA >60 >60  GFRAA >60 >60  ANIONGAP 11 14     Hematology Recent Labs  Lab 06/29/18 1035 06/30/18 0318  WBC 18.6* 10.1  RBC 5.44 4.90  HGB 16.4 14.6  HCT 50.8 44.7  MCV 93.4 91.2  MCH 30.1 29.8  MCHC 32.3 32.7  RDW 13.7 13.8  PLT 257 223    Cardiac Enzymes Recent Labs  Lab 06/29/18 1344 06/29/18 2010 06/30/18 0318  TROPONINI 30.55* 46.33* 42.02*    Recent Labs  Lab 06/29/18 1040 06/29/18 1348  TROPIPOC 6.71* 24.18*     BNP Recent Labs  Lab 06/29/18 1344 06/30/18 0318  BNP 665.4* 1,369.9*  DDimer  Recent Labs  Lab 06/29/18 1035  DDIMER 0.93*     Radiology    Ct Angio Chest Pe W Or Wo Contrast  Result Date: 06/29/2018 CLINICAL DATA:  Chest pain and shortness of breath since 7 a.m. this morning. EXAM: CT ANGIOGRAPHY CHEST WITH CONTRAST TECHNIQUE: Multidetector CT imaging of the chest was performed using the standard protocol during bolus administration of intravenous contrast. Multiplanar CT image reconstructions and MIPs were obtained to evaluate the vascular anatomy. CONTRAST:  <See Chart> ISOVUE-370 IOPAMIDOL (ISOVUE-370) INJECTION 76% COMPARISON:  None. FINDINGS: Cardiovascular: The heart is mildly enlarged but stable. No pericardial effusion. The aorta is normal in caliber. Moderate atherosclerotic calcifications. Scattered coronary artery calcifications. The pulmonary arterial tree is fairly well opacified. There are small  bilateral filling defects consistent with pulmonary embolism. No right heart strain. Mediastinum/Nodes: Scattered mediastinal and hilar lymph nodes. The esophagus is grossly normal. Lungs/Pleura: Moderate breathing motion artifact. No worrisome pulmonary lesions. There are small bilateral pleural effusions with vascular congestion and mild pulmonary edema. No infiltrates. No pulmonary lesions. Upper Abdomen: No significant upper abdominal findings. Musculoskeletal: No chest wall mass, supraclavicular or axillary adenopathy. No significant bony findings. There are mild compression deformities involving T7 and D98 of uncertain age. Review of the MIP images confirms the above findings. IMPRESSION: 1. Small bilateral pulmonary emboli. 2. Mild cardiac enlargement, vascular congestion and mild pulmonary edema along with small pleural effusions. 3. Atherosclerotic calcifications involving the thoracic aorta but no aneurysm. 4. No mediastinal or hilar mass or adenopathy. Aortic Atherosclerosis (ICD10-I70.0). Electronically Signed   By: Marijo Sanes M.D.   On: 06/29/2018 15:20   Dg Chest Port 1 View  Result Date: 06/30/2018 CLINICAL DATA:  Pre CABG, balloon pump EXAM: PORTABLE CHEST 1 VIEW COMPARISON:  Chest x-ray of 06/29/2017 FINDINGS: There is no change in position of the [name] for the balloon pump which overlies the proximal descending thoracic aorta. Cardiomegaly and mild pulmonary vascular congestion remains. There may be tiny effusions present. No bony abnormality is seen. IMPRESSION: 1. No change in position of the [name] for the aortic balloon pump. 2. No change in cardiomegaly and pulmonary vascular congestion. Electronically Signed   By: Ivar Drape M.D.   On: 06/30/2018 08:35   Dg Chest Port 1 View  Result Date: 06/29/2018 CLINICAL DATA:  Shortness of breath, check IABP EXAM: PORTABLE CHEST 1 VIEW COMPARISON:  Films from earlier in the same day. FINDINGS: Intra-aortic balloon pump has been placed with  the tip just below the aortic knob. Aortic calcifications are again seen. Cardiac shadow is stable. Mild central vascular congestion with mild interstitial edema is seen. No focal confluent infiltrate is seen. IMPRESSION: Intra-aortic balloon pump as described. Central vascular congestion with mild interstitial edema. Electronically Signed   By: Inez Catalina M.D.   On: 06/29/2018 20:16   Dg Chest Port 1 View  Result Date: 06/29/2018 CLINICAL DATA:  Shortness of breath. Chest pain. EXAM: PORTABLE CHEST 1 VIEW COMPARISON:  None. FINDINGS: The patient has bilateral mild interstitial pulmonary edema. Heart size and pulmonary vascularity are within normal limits. No discrete effusions. Slight compression deformity of T7, age indeterminate. IMPRESSION: Bilateral interstitial pulmonary edema. Compression fracture of T7, age indeterminate. Electronically Signed   By: Lorriane Shire M.D.   On: 06/29/2018 11:25    Cardiac Studies   Cath 06/29/18 (emergent)  Mid RCA lesion is 100% stenosed.  Mid LM to Dist LM lesion is 30% stenosed.  Prox LAD lesion is 100% stenosed.  Ost 2nd Mrg lesion is 100% stenosed.  LV end diastolic pressure is moderately elevated.  There is moderate to severe left ventricular systolic dysfunction.   1.  Severe three-vessel coronary artery disease likely with recent occlusion of the RCA 2.  Total occlusion of the mid RCA, proximal LAD, and second obtuse marginal, all vessels supplied by collateral branches 3.  Moderately severe LV systolic dysfunction with LVEF estimated at 35% with akinesis of the anterolateral wall and basal inferior walls 4.  Diffuse aortoiliac disease without evidence of abdominal aortic aneurysm 5.  Successful placement of an intra-aortic balloon pump via right femoral artery access  Recommendations: Once the patient is stabilized, I think he would likely benefit from surgical revascularization for treatment of his critical multivessel coronary artery  disease.  A TCTS consult will be placed.  Patient Profile     72 y.o. male without significant PMH (no doctor in 66 years) but 34 years of tobacco use presented with NSTEMI; initially pain free despite very elevated troponin, but had an acute episode of decompensation on the evening of 06/29/18 requiring urgent catheterization. Found to have severe multivessel disease, IABP place, pending CABG.  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 Servando Snare, plan for CABG tomorrow -echo being performed now -dopplers ordered -A1c normal at 5.4 -on aspirin 81 mg, atorvastatin 80 mg -on metoprolol 12.5 mg BID -given NSTEMI, should be on clopidogrel for 12 mos post CABG  Hypertension: now with balloon pump -on metoprolol -likely will need ACEi/ARB and spironolactone, will start after surgery when able  Hyperlipidemia: LDL 169, Tchol 250, HDL 69 -started on atorvastatin 80 mg  Tobacco abuse: counseled, plans to quit now, declines smoking cessation aid at this time  Small bilateral PE -On anticoagulation now prior to CABG.  -suspect this was provoked with travel, so post CABG when appropriate would transition to anticoagulation. Will need 3 months of AC.  For questions or updates, please contact [place] Please consult [email or web address] for contact info under     Signed, Buford Dresser, MD  06/30/2018, 8:50 AM

## 2018-06-30 NOTE — Plan of Care (Signed)
IS instruction sheet given, practiced

## 2018-06-30 NOTE — Plan of Care (Signed)
Recovering from [facility] video shown to pt and family

## 2018-07-01 ENCOUNTER — Inpatient Hospital Stay (HOSPITAL_COMMUNITY): Payer: Medicare PPO | Admitting: Anesthesiology

## 2018-07-01 ENCOUNTER — Inpatient Hospital Stay (HOSPITAL_COMMUNITY): Payer: Medicare PPO

## 2018-07-01 ENCOUNTER — Encounter (HOSPITAL_COMMUNITY)
Admission: EM | Disposition: A | Discharge status: Home / Self Care | Payer: Self-pay | Source: Home / Self Care | Attending: Cardiothoracic Surgery

## 2018-07-01 ENCOUNTER — Encounter (HOSPITAL_COMMUNITY): Payer: Self-pay | Admitting: Anesthesiology

## 2018-07-01 DIAGNOSIS — I25110 Atherosclerotic heart disease of native coronary artery with unstable angina pectoris: Secondary | ICD-10-CM

## 2018-07-01 DIAGNOSIS — Z951 Presence of aortocoronary bypass graft: Secondary | ICD-10-CM

## 2018-07-01 HISTORY — PX: CORONARY ARTERY BYPASS GRAFT: SHX141

## 2018-07-01 HISTORY — PX: TEE WITHOUT CARDIOVERSION: SHX5443

## 2018-07-01 LAB — BASIC METABOLIC PANEL
Anion gap: 9 (ref 5–15)
BUN: 20 mg/dL (ref 8–23)
CO2: 23 mmol/L (ref 22–32)
Calcium: 8.6 mg/dL — ABNORMAL LOW (ref 8.9–10.3)
Chloride: 104 mmol/L (ref 98–111)
Creatinine, Ser: 0.97 mg/dL (ref 0.61–1.24)
GFR calc Af Amer: >60 mL/min (ref >60)
GFR calc non Af Amer: >60 mL/min (ref >60)
Glucose, Bld: 112 mg/dL — ABNORMAL HIGH (ref 70–99)
Potassium: 3.7 mmol/L (ref 3.5–5.1)
Sodium: 136 mmol/L (ref 135–145)

## 2018-07-01 LAB — POCT I-STAT, CHEM 8
BUN: 18 mg/dL (ref 8–23)
BUN: 16 mg/dL (ref 8–23)
BUN: 17 mg/dL (ref 8–23)
BUN: 16 mg/dL (ref 8–23)
BUN: 16 mg/dL (ref 8–23)
BUN: 16 mg/dL (ref 8–23)
BUN: 16 mg/dL (ref 8–23)
BUN: 15 mg/dL (ref 8–23)
CALCIUM ION: 1.1 mmol/L — AB (ref 1.15–1.40)
CHLORIDE: 101 mmol/L (ref 98–111)
CHLORIDE: 102 mmol/L (ref 98–111)
CREATININE: 0.7 mg/dL (ref 0.61–1.24)
Calcium, Ion: 1.18 mmol/L (ref 1.15–1.40)
Calcium, Ion: 0.97 mmol/L — ABNORMAL LOW (ref 1.15–1.40)
Calcium, Ion: 1.16 mmol/L (ref 1.15–1.40)
Calcium, Ion: 1.05 mmol/L — ABNORMAL LOW (ref 1.15–1.40)
Calcium, Ion: 1.04 mmol/L — ABNORMAL LOW (ref 1.15–1.40)
Calcium, Ion: 1.09 mmol/L — ABNORMAL LOW (ref 1.15–1.40)
Calcium, Ion: 1.08 mmol/L — ABNORMAL LOW (ref 1.15–1.40)
Chloride: 104 mmol/L (ref 98–111)
Chloride: 101 mmol/L (ref 98–111)
Chloride: 106 mmol/L (ref 98–111)
Chloride: 103 mmol/L (ref 98–111)
Chloride: 101 mmol/L (ref 98–111)
Chloride: 106 mmol/L (ref 98–111)
Creatinine, Ser: 0.8 mg/dL (ref 0.61–1.24)
Creatinine, Ser: 0.7 mg/dL (ref 0.61–1.24)
Creatinine, Ser: 0.8 mg/dL (ref 0.61–1.24)
Creatinine, Ser: 0.7 mg/dL (ref 0.61–1.24)
Creatinine, Ser: 0.8 mg/dL (ref 0.61–1.24)
Creatinine, Ser: 0.8 mg/dL (ref 0.61–1.24)
Creatinine, Ser: 0.7 mg/dL (ref 0.61–1.24)
Glucose, Bld: 112 mg/dL — ABNORMAL HIGH (ref 70–99)
Glucose, Bld: 105 mg/dL — ABNORMAL HIGH (ref 70–99)
Glucose, Bld: 95 mg/dL (ref 70–99)
Glucose, Bld: 117 mg/dL — ABNORMAL HIGH (ref 70–99)
Glucose, Bld: 129 mg/dL — ABNORMAL HIGH (ref 70–99)
Glucose, Bld: 138 mg/dL — ABNORMAL HIGH (ref 70–99)
Glucose, Bld: 135 mg/dL — ABNORMAL HIGH (ref 70–99)
Glucose, Bld: 125 mg/dL — ABNORMAL HIGH (ref 70–99)
HCT: 35 % — ABNORMAL LOW (ref 39.0–52.0)
HCT: 33 % — ABNORMAL LOW (ref 39.0–52.0)
HCT: 26 % — ABNORMAL LOW (ref 39.0–52.0)
HCT: 28 % — ABNORMAL LOW (ref 39.0–52.0)
HEMATOCRIT: 27 % — AB (ref 39.0–52.0)
HEMATOCRIT: 28 % — AB (ref 39.0–52.0)
HEMATOCRIT: 27 % — AB (ref 39.0–52.0)
HEMATOCRIT: 34 % — AB (ref 39.0–52.0)
Hemoglobin: 11.9 g/dL — ABNORMAL LOW (ref 13.0–17.0)
Hemoglobin: 11.2 g/dL — ABNORMAL LOW (ref 13.0–17.0)
Hemoglobin: 9.2 g/dL — ABNORMAL LOW (ref 13.0–17.0)
Hemoglobin: 9.5 g/dL — ABNORMAL LOW (ref 13.0–17.0)
Hemoglobin: 8.8 g/dL — ABNORMAL LOW (ref 13.0–17.0)
Hemoglobin: 9.5 g/dL — ABNORMAL LOW (ref 13.0–17.0)
Hemoglobin: 9.2 g/dL — ABNORMAL LOW (ref 13.0–17.0)
Hemoglobin: 11.6 g/dL — ABNORMAL LOW (ref 13.0–17.0)
POTASSIUM: 4 mmol/L (ref 3.5–5.1)
Potassium: 4.1 mmol/L (ref 3.5–5.1)
Potassium: 3.9 mmol/L (ref 3.5–5.1)
Potassium: 3.9 mmol/L (ref 3.5–5.1)
Potassium: 4.2 mmol/L (ref 3.5–5.1)
Potassium: 4.3 mmol/L (ref 3.5–5.1)
Potassium: 3.9 mmol/L (ref 3.5–5.1)
Potassium: 4.6 mmol/L (ref 3.5–5.1)
SODIUM: 136 mmol/L (ref 135–145)
Sodium: 137 mmol/L (ref 135–145)
Sodium: 137 mmol/L (ref 135–145)
Sodium: 138 mmol/L (ref 135–145)
Sodium: 136 mmol/L (ref 135–145)
Sodium: 134 mmol/L — ABNORMAL LOW (ref 135–145)
Sodium: 134 mmol/L — ABNORMAL LOW (ref 135–145)
Sodium: 136 mmol/L (ref 135–145)
TCO2: 24 mmol/L (ref 22–32)
TCO2: 22 mmol/L (ref 22–32)
TCO2: 25 mmol/L (ref 22–32)
TCO2: 26 mmol/L (ref 22–32)
TCO2: 25 mmol/L (ref 22–32)
TCO2: 25 mmol/L (ref 22–32)
TCO2: 23 mmol/L (ref 22–32)
TCO2: 22 mmol/L (ref 22–32)

## 2018-07-01 LAB — CBC
HCT: 40.5 % (ref 39.0–52.0)
HCT: 38.5 % — ABNORMAL LOW (ref 39.0–52.0)
HCT: 35.1 % — ABNORMAL LOW (ref 39.0–52.0)
Hemoglobin: 13.3 g/dL (ref 13.0–17.0)
Hemoglobin: 13 g/dL (ref 13.0–17.0)
Hemoglobin: 11.6 g/dL — ABNORMAL LOW (ref 13.0–17.0)
MCH: 30 pg (ref 26.0–34.0)
MCH: 31 pg (ref 26.0–34.0)
MCH: 30.8 pg (ref 26.0–34.0)
MCHC: 32.8 g/dL (ref 30.0–36.0)
MCHC: 33.8 g/dL (ref 30.0–36.0)
MCHC: 33 g/dL (ref 30.0–36.0)
MCV: 91.2 fL (ref 80.0–100.0)
MCV: 91.7 fL (ref 80.0–100.0)
MCV: 93.1 fL (ref 80.0–100.0)
PLATELETS: 194 10*3/uL (ref 150–400)
Platelets: 142 10*3/uL — ABNORMAL LOW (ref 150–400)
Platelets: 120 10*3/uL — ABNORMAL LOW (ref 150–400)
RBC: 4.44 MIL/uL (ref 4.22–5.81)
RBC: 4.2 MIL/uL — ABNORMAL LOW (ref 4.22–5.81)
RBC: 3.77 MIL/uL — ABNORMAL LOW (ref 4.22–5.81)
RDW: 13.7 % (ref 11.5–15.5)
RDW: 13.6 % (ref 11.5–15.5)
RDW: 13.7 % (ref 11.5–15.5)
WBC: 9.9 10*3/uL (ref 4.0–10.5)
WBC: 14.1 10*3/uL — ABNORMAL HIGH (ref 4.0–10.5)
WBC: 10.5 10*3/uL (ref 4.0–10.5)
nRBC: 0 % (ref 0.0–0.2)
nRBC: 0 % (ref 0.0–0.2)
nRBC: 0 % (ref 0.0–0.2)

## 2018-07-01 LAB — POCT I-STAT 3, ART BLOOD GAS (G3+)
Acid-base deficit: 2 mmol/L (ref 0.0–2.0)
Acid-base deficit: 2 mmol/L (ref 0.0–2.0)
Acid-base deficit: 1 mmol/L (ref 0.0–2.0)
Acid-base deficit: 5 mmol/L — ABNORMAL HIGH (ref 0.0–2.0)
Bicarbonate: 23.5 mmol/L (ref 20.0–28.0)
Bicarbonate: 24.8 mmol/L (ref 20.0–28.0)
Bicarbonate: 23.1 mmol/L (ref 20.0–28.0)
Bicarbonate: 23.9 mmol/L (ref 20.0–28.0)
Bicarbonate: 21.7 mmol/L (ref 20.0–28.0)
O2 Saturation: 100 %
O2 Saturation: 100 %
O2 Saturation: 99 %
O2 Saturation: 97 %
O2 Saturation: 92 %
PH ART: 7.384 (ref 7.350–7.450)
PO2 ART: 378 mmHg — AB (ref 83.0–108.0)
PO2 ART: 155 mmHg — AB (ref 83.0–108.0)
TCO2: 25 mmol/L (ref 22–32)
TCO2: 26 mmol/L (ref 22–32)
TCO2: 24 mmol/L (ref 22–32)
TCO2: 25 mmol/L (ref 22–32)
TCO2: 23 mmol/L (ref 22–32)
pCO2 arterial: 40.5 mmHg (ref 32.0–48.0)
pCO2 arterial: 40.1 mmHg (ref 32.0–48.0)
pCO2 arterial: 39.8 mmHg (ref 32.0–48.0)
pCO2 arterial: 40 mmHg (ref 32.0–48.0)
pCO2 arterial: 46.1 mmHg (ref 32.0–48.0)
pH, Arterial: 7.37 (ref 7.350–7.450)
pH, Arterial: 7.399 (ref 7.350–7.450)
pH, Arterial: 7.373 (ref 7.350–7.450)
pH, Arterial: 7.287 — ABNORMAL LOW (ref 7.350–7.450)
pO2, Arterial: 262 mmHg — ABNORMAL HIGH (ref 83.0–108.0)
pO2, Arterial: 88 mmHg (ref 83.0–108.0)
pO2, Arterial: 75 mmHg — ABNORMAL LOW (ref 83.0–108.0)

## 2018-07-01 LAB — GLUCOSE, CAPILLARY
GLUCOSE-CAPILLARY: 108 mg/dL — AB (ref 70–99)
Glucose-Capillary: 125 mg/dL — ABNORMAL HIGH (ref 70–99)
Glucose-Capillary: 109 mg/dL — ABNORMAL HIGH (ref 70–99)
Glucose-Capillary: 99 mg/dL (ref 70–99)
Glucose-Capillary: 110 mg/dL — ABNORMAL HIGH (ref 70–99)

## 2018-07-01 LAB — CREATININE, SERUM
Creatinine, Ser: 0.89 mg/dL (ref 0.61–1.24)
GFR calc Af Amer: >60 mL/min (ref >60)
GFR calc non Af Amer: >60 mL/min (ref >60)

## 2018-07-01 LAB — SURGICAL PCR SCREEN
MRSA, PCR: NEGATIVE
Staphylococcus aureus: NEGATIVE

## 2018-07-01 LAB — POCT I-STAT 4, (NA,K, GLUC, HGB,HCT)
Glucose, Bld: 114 mg/dL — ABNORMAL HIGH (ref 70–99)
HCT: 36 % — ABNORMAL LOW (ref 39.0–52.0)
Hemoglobin: 12.2 g/dL — ABNORMAL LOW (ref 13.0–17.0)
POTASSIUM: 3.8 mmol/L (ref 3.5–5.1)
Sodium: 138 mmol/L (ref 135–145)

## 2018-07-01 LAB — PROTIME-INR
INR: 1.42
Prothrombin Time: 17.2 seconds — ABNORMAL HIGH (ref 11.4–15.2)

## 2018-07-01 LAB — HEMOGLOBIN AND HEMATOCRIT, BLOOD
HCT: 28.2 % — ABNORMAL LOW (ref 39.0–52.0)
Hemoglobin: 9.2 g/dL — ABNORMAL LOW (ref 13.0–17.0)

## 2018-07-01 LAB — APTT: aPTT: 34 seconds (ref 24–36)

## 2018-07-01 LAB — HEPARIN LEVEL (UNFRACTIONATED): Heparin Unfractionated: <0.1 IU/mL — ABNORMAL LOW (ref 0.30–0.70)

## 2018-07-01 LAB — PLATELET COUNT: Platelets: 125 10*3/uL — ABNORMAL LOW (ref 150–400)

## 2018-07-01 LAB — MAGNESIUM: Magnesium: 3.2 mg/dL — ABNORMAL HIGH (ref 1.7–2.4)

## 2018-07-01 SURGERY — CORONARY ARTERY BYPASS GRAFTING (CABG)
Anesthesia: General | Site: Chest

## 2018-07-01 MED ORDER — LACTATED RINGERS IV SOLN
INTRAVENOUS | Status: DC | PRN
  Administered 2018-07-01: 09:00:00 via INTRAVENOUS

## 2018-07-01 MED ORDER — DIPHENHYDRAMINE HCL 50 MG/ML IJ SOLN
INTRAMUSCULAR | Status: AC
  Filled 2018-07-01: qty 1

## 2018-07-01 MED ORDER — ONDANSETRON HCL 4 MG/2ML IJ SOLN
INTRAMUSCULAR | Status: DC | PRN
  Administered 2018-07-01: 4 mg via INTRAVENOUS

## 2018-07-01 MED ORDER — ACETAMINOPHEN 160 MG/5ML PO SOLN: 650.0000 mg | Freq: Once | ORAL | Status: AC

## 2018-07-01 MED ORDER — FENTANYL CITRATE (PF) 250 MCG/5ML IJ SOLN
INTRAMUSCULAR | Status: DC | PRN
  Administered 2018-07-01 (×2): 50 ug via INTRAVENOUS
  Administered 2018-07-01 (×2): 250 ug via INTRAVENOUS
  Administered 2018-07-01: 150 ug via INTRAVENOUS
  Administered 2018-07-01: 100 ug via INTRAVENOUS
  Administered 2018-07-01: 200 ug via INTRAVENOUS
  Administered 2018-07-01 (×2): 100 ug via INTRAVENOUS

## 2018-07-01 MED ORDER — MIDAZOLAM HCL (PF) 10 MG/2ML IJ SOLN
INTRAMUSCULAR | Status: AC
  Filled 2018-07-01: qty 2

## 2018-07-01 MED ORDER — LACTATED RINGERS IV SOLN: 500.0000 mL | Freq: Once | INTRAVENOUS | Status: DC | PRN

## 2018-07-01 MED ORDER — SODIUM CHLORIDE 0.9% FLUSH: 3.0000 mL | INTRAVENOUS | Status: DC | PRN

## 2018-07-01 MED ORDER — CHLORHEXIDINE GLUCONATE 0.12% ORAL RINSE (MEDLINE KIT)
15.0000 mL | Freq: Two times a day (BID) | OROMUCOSAL | Status: DC
  Administered 2018-07-01 – 2018-07-02 (×2): 15 mL via OROMUCOSAL

## 2018-07-01 MED ORDER — SODIUM CHLORIDE 0.9% FLUSH
3.0000 mL | Freq: Two times a day (BID) | INTRAVENOUS | Status: DC
  Administered 2018-07-02 – 2018-07-04 (×3): 3 mL via INTRAVENOUS

## 2018-07-01 MED ORDER — PHENYLEPHRINE 40 MCG/ML (10ML) SYRINGE FOR IV PUSH (FOR BLOOD PRESSURE SUPPORT)
PREFILLED_SYRINGE | INTRAVENOUS | Status: AC
  Filled 2018-07-01: qty 20

## 2018-07-01 MED ORDER — METOPROLOL TARTRATE 5 MG/5ML IV SOLN
2.5000 mg | INTRAVENOUS | Status: DC | PRN
  Administered 2018-07-03: 5 mg via INTRAVENOUS
  Filled 2018-07-01: qty 5

## 2018-07-01 MED ORDER — SODIUM CHLORIDE (PF) 0.9 % IJ SOLN
OROMUCOSAL | Status: DC | PRN
  Administered 2018-07-01 (×2): via TOPICAL

## 2018-07-01 MED ORDER — METOPROLOL TARTRATE 25 MG/10 ML ORAL SUSPENSION: 12.5000 mg | Freq: Two times a day (BID) | ORAL | Status: DC

## 2018-07-01 MED ORDER — OXYCODONE HCL 5 MG PO TABS
5.0000 mg | ORAL_TABLET | ORAL | Status: DC | PRN
  Administered 2018-07-02: 5 mg via ORAL
  Administered 2018-07-02 – 2018-07-03 (×3): 10 mg via ORAL
  Filled 2018-07-01: qty 1
  Filled 2018-07-01 (×3): qty 2

## 2018-07-01 MED ORDER — PHENYLEPHRINE 40 MCG/ML (10ML) SYRINGE FOR IV PUSH (FOR BLOOD PRESSURE SUPPORT)
PREFILLED_SYRINGE | INTRAVENOUS | Status: DC | PRN
  Administered 2018-07-01: 160 ug via INTRAVENOUS
  Administered 2018-07-01: 80 ug via INTRAVENOUS

## 2018-07-01 MED ORDER — ASPIRIN 81 MG PO CHEW: 324.0000 mg | CHEWABLE_TABLET | Freq: Every day | ORAL | Status: DC

## 2018-07-01 MED ORDER — HEPARIN SODIUM (PORCINE) 1000 UNIT/ML IJ SOLN
INTRAMUSCULAR | Status: DC | PRN
  Administered 2018-07-01: 24000 [IU] via INTRAVENOUS

## 2018-07-01 MED ORDER — NITROGLYCERIN IN D5W 200-5 MCG/ML-% IV SOLN: 0.0000 ug/min | INTRAVENOUS | Status: DC

## 2018-07-01 MED ORDER — SODIUM CHLORIDE 0.45 % IV SOLN
INTRAVENOUS | Status: DC | PRN
  Administered 2018-07-01: 16:00:00 via INTRAVENOUS

## 2018-07-01 MED ORDER — PHENYLEPHRINE HCL-NACL 20-0.9 MG/250ML-% IV SOLN: 0.0000 ug/min | INTRAVENOUS | Status: DC

## 2018-07-01 MED ORDER — VANCOMYCIN HCL IN DEXTROSE 1-5 GM/200ML-% IV SOLN
1000.0000 mg | Freq: Once | INTRAVENOUS | Status: AC
  Administered 2018-07-01: 1000 mg via INTRAVENOUS
  Filled 2018-07-01: qty 200

## 2018-07-01 MED ORDER — PROPOFOL 10 MG/ML IV BOLUS
INTRAVENOUS | Status: AC
  Filled 2018-07-01: qty 20

## 2018-07-01 MED ORDER — BISACODYL 10 MG RE SUPP: 10.0000 mg | Freq: Every day | RECTAL | Status: DC

## 2018-07-01 MED ORDER — SODIUM CHLORIDE 0.9% FLUSH: 10.0000 mL | INTRAVENOUS | Status: DC | PRN

## 2018-07-01 MED ORDER — DOPAMINE-DEXTROSE 3.2-5 MG/ML-% IV SOLN
2.5000 ug/kg/min | INTRAVENOUS | Status: DC
  Administered 2018-07-01: 3 ug/kg/min via INTRAVENOUS

## 2018-07-01 MED ORDER — ETOMIDATE 2 MG/ML IV SOLN
INTRAVENOUS | Status: AC
  Filled 2018-07-01: qty 10

## 2018-07-01 MED ORDER — ROCURONIUM BROMIDE 50 MG/5ML IV SOSY
PREFILLED_SYRINGE | INTRAVENOUS | Status: AC
  Filled 2018-07-01: qty 5

## 2018-07-01 MED ORDER — NITROGLYCERIN IN D5W 200-5 MCG/ML-% IV SOLN
0.0000 ug/min | INTRAVENOUS | Status: DC
  Administered 2018-07-01: 30 ug/min via INTRAVENOUS

## 2018-07-01 MED ORDER — MIDAZOLAM HCL 2 MG/2ML IJ SOLN: 2.0000 mg | INTRAMUSCULAR | Status: DC | PRN

## 2018-07-01 MED ORDER — ONDANSETRON HCL 4 MG/2ML IJ SOLN: 4.0000 mg | Freq: Four times a day (QID) | INTRAMUSCULAR | Status: DC | PRN

## 2018-07-01 MED ORDER — SODIUM CHLORIDE 0.9 % IV SOLN
INTRAVENOUS | Status: DC | PRN
  Administered 2018-07-01: 14:00:00 via INTRAVENOUS

## 2018-07-01 MED ORDER — CHLORHEXIDINE GLUCONATE CLOTH 2 % EX PADS
6.0000 | MEDICATED_PAD | Freq: Every day | CUTANEOUS | Status: DC
  Administered 2018-07-01 – 2018-07-03 (×3): 6 via TOPICAL

## 2018-07-01 MED ORDER — ORAL CARE MOUTH RINSE
15.0000 mL | OROMUCOSAL | Status: DC
  Administered 2018-07-01 – 2018-07-02 (×11): 15 mL via OROMUCOSAL

## 2018-07-01 MED ORDER — MORPHINE SULFATE (PF) 2 MG/ML IV SOLN
1.0000 mg | INTRAVENOUS | Status: DC | PRN
  Administered 2018-07-01 (×3): 2 mg via INTRAVENOUS
  Administered 2018-07-03: 4 mg via INTRAVENOUS
  Filled 2018-07-01: qty 2
  Filled 2018-07-01 (×3): qty 1

## 2018-07-01 MED ORDER — SODIUM CHLORIDE 0.9 % IV SOLN: INTRAVENOUS | Status: DC

## 2018-07-01 MED ORDER — HEPARIN SODIUM (PORCINE) 1000 UNIT/ML IJ SOLN
INTRAMUSCULAR | Status: AC
  Filled 2018-07-01: qty 1

## 2018-07-01 MED ORDER — SODIUM CHLORIDE 0.9% FLUSH
10.0000 mL | Freq: Two times a day (BID) | INTRAVENOUS | Status: DC
  Administered 2018-07-01 – 2018-07-03 (×5): 10 mL

## 2018-07-01 MED ORDER — PROTAMINE SULFATE 10 MG/ML IV SOLN
INTRAVENOUS | Status: DC | PRN
  Administered 2018-07-01: 40 mg via INTRAVENOUS
  Administered 2018-07-01: 20 mg via INTRAVENOUS
  Administered 2018-07-01 (×2): 40 mg via INTRAVENOUS
  Administered 2018-07-01: 60 mg via INTRAVENOUS

## 2018-07-01 MED ORDER — ACETAMINOPHEN 500 MG PO TABS
1000.0000 mg | ORAL_TABLET | Freq: Four times a day (QID) | ORAL | Status: DC
  Administered 2018-07-02 – 2018-07-05 (×12): 1000 mg via ORAL
  Filled 2018-07-01 (×12): qty 2

## 2018-07-01 MED ORDER — LACTATED RINGERS IV SOLN
INTRAVENOUS | Status: DC
  Administered 2018-07-01 (×2): via INTRAVENOUS

## 2018-07-01 MED ORDER — BISACODYL 5 MG PO TBEC
10.0000 mg | DELAYED_RELEASE_TABLET | Freq: Every day | ORAL | Status: DC
  Administered 2018-07-02 – 2018-07-04 (×3): 10 mg via ORAL
  Filled 2018-07-01 (×3): qty 2

## 2018-07-01 MED ORDER — ONDANSETRON HCL 4 MG/2ML IJ SOLN
INTRAMUSCULAR | Status: AC
  Filled 2018-07-01: qty 2

## 2018-07-01 MED ORDER — FENTANYL CITRATE (PF) 250 MCG/5ML IJ SOLN
INTRAMUSCULAR | Status: AC
  Filled 2018-07-01: qty 25

## 2018-07-01 MED ORDER — TRAMADOL HCL 50 MG PO TABS: 50.0000 mg | ORAL_TABLET | ORAL | Status: DC | PRN

## 2018-07-01 MED ORDER — 0.9 % SODIUM CHLORIDE (POUR BTL) OPTIME
TOPICAL | Status: DC | PRN
  Administered 2018-07-01: 5000 mL

## 2018-07-01 MED ORDER — DIPHENHYDRAMINE HCL 50 MG/ML IJ SOLN
INTRAMUSCULAR | Status: DC | PRN
  Administered 2018-07-01: 12.5 mg via INTRAVENOUS

## 2018-07-01 MED ORDER — ARTIFICIAL TEARS OPHTHALMIC OINT
TOPICAL_OINTMENT | OPHTHALMIC | Status: DC | PRN
  Administered 2018-07-01: 1 via OPHTHALMIC

## 2018-07-01 MED ORDER — LIDOCAINE 2% (20 MG/ML) 5 ML SYRINGE
INTRAMUSCULAR | Status: AC
  Filled 2018-07-01: qty 5

## 2018-07-01 MED ORDER — METOPROLOL TARTRATE 12.5 MG HALF TABLET
12.5000 mg | ORAL_TABLET | Freq: Two times a day (BID) | ORAL | Status: DC
  Administered 2018-07-02 – 2018-07-03 (×3): 12.5 mg via ORAL
  Filled 2018-07-01 (×3): qty 1

## 2018-07-01 MED ORDER — INSULIN ASPART 100 UNIT/ML ~~LOC~~ SOLN
0.0000 [IU] | SUBCUTANEOUS | Status: DC
  Administered 2018-07-02: 2 [IU] via SUBCUTANEOUS

## 2018-07-01 MED ORDER — PROPOFOL 10 MG/ML IV BOLUS
INTRAVENOUS | Status: DC | PRN
  Administered 2018-07-01: 70 mg via INTRAVENOUS
  Administered 2018-07-01: 30 mg via INTRAVENOUS

## 2018-07-01 MED ORDER — POTASSIUM CHLORIDE 10 MEQ/50ML IV SOLN
10.0000 meq | INTRAVENOUS | Status: AC
  Administered 2018-07-01 (×3): 10 meq via INTRAVENOUS

## 2018-07-01 MED ORDER — FAMOTIDINE IN NACL 20-0.9 MG/50ML-% IV SOLN
20.0000 mg | Freq: Two times a day (BID) | INTRAVENOUS | Status: AC
  Administered 2018-07-01 (×2): 20 mg via INTRAVENOUS
  Filled 2018-07-01: qty 50

## 2018-07-01 MED ORDER — HEMOSTATIC AGENTS (NO CHARGE) OPTIME
TOPICAL | Status: DC | PRN
  Administered 2018-07-01: 1 via TOPICAL
  Administered 2018-07-01: 2 via TOPICAL

## 2018-07-01 MED ORDER — INSULIN REGULAR BOLUS VIA INFUSION
0.0000 [IU] | Freq: Three times a day (TID) | INTRAVENOUS | Status: DC
  Filled 2018-07-01: qty 10

## 2018-07-01 MED ORDER — MILRINONE LACTATE IN DEXTROSE 20-5 MG/100ML-% IV SOLN
0.1250 ug/kg/min | INTRAVENOUS | Status: DC
  Administered 2018-07-01 – 2018-07-03 (×5): 0.3 ug/kg/min via INTRAVENOUS
  Administered 2018-07-04: 0.125 ug/kg/min via INTRAVENOUS
  Filled 2018-07-01 (×5): qty 100

## 2018-07-01 MED ORDER — DEXMEDETOMIDINE HCL IN NACL 200 MCG/50ML IV SOLN
0.0000 ug/kg/h | INTRAVENOUS | Status: DC
  Administered 2018-07-01 (×3): 0.7 ug/kg/h via INTRAVENOUS
  Filled 2018-07-01: qty 50

## 2018-07-01 MED ORDER — EPHEDRINE SULFATE-NACL 50-0.9 MG/10ML-% IV SOSY
PREFILLED_SYRINGE | INTRAVENOUS | Status: DC | PRN
  Administered 2018-07-01: 10 mg via INTRAVENOUS

## 2018-07-01 MED ORDER — MAGNESIUM SULFATE 4 GM/100ML IV SOLN
4.0000 g | Freq: Once | INTRAVENOUS | Status: AC
  Administered 2018-07-01: 4 g via INTRAVENOUS
  Filled 2018-07-01: qty 100

## 2018-07-01 MED ORDER — SODIUM CHLORIDE 0.9 % IV SOLN
1.5000 g | Freq: Two times a day (BID) | INTRAVENOUS | Status: AC
  Administered 2018-07-01 – 2018-07-03 (×4): 1.5 g via INTRAVENOUS
  Filled 2018-07-01 (×4): qty 1.5

## 2018-07-01 MED ORDER — ALBUMIN HUMAN 5 % IV SOLN
250.0000 mL | INTRAVENOUS | Status: AC | PRN
  Administered 2018-07-01 (×3): 12.5 g via INTRAVENOUS
  Filled 2018-07-01: qty 250

## 2018-07-01 MED ORDER — ACETAMINOPHEN 160 MG/5ML PO SOLN
1000.0000 mg | Freq: Four times a day (QID) | ORAL | Status: DC
  Administered 2018-07-01: 1000 mg
  Filled 2018-07-01: qty 40.6

## 2018-07-01 MED ORDER — ACETAMINOPHEN 650 MG RE SUPP
650.0000 mg | Freq: Once | RECTAL | Status: AC
  Administered 2018-07-01: 650 mg via RECTAL

## 2018-07-01 MED ORDER — ROCURONIUM BROMIDE 50 MG/5ML IV SOSY
PREFILLED_SYRINGE | INTRAVENOUS | Status: DC | PRN
  Administered 2018-07-01: 30 mg via INTRAVENOUS
  Administered 2018-07-01: 50 mg via INTRAVENOUS
  Administered 2018-07-01: 100 mg via INTRAVENOUS

## 2018-07-01 MED ORDER — SODIUM BICARBONATE 8.4 % IV SOLN
50.0000 meq | Freq: Once | INTRAVENOUS | Status: AC
  Administered 2018-07-01: 50 meq via INTRAVENOUS

## 2018-07-01 MED ORDER — PANTOPRAZOLE SODIUM 40 MG PO TBEC
40.0000 mg | DELAYED_RELEASE_TABLET | Freq: Every day | ORAL | Status: DC
  Administered 2018-07-03 – 2018-07-04 (×2): 40 mg via ORAL
  Filled 2018-07-01 (×2): qty 1

## 2018-07-01 MED ORDER — MIDAZOLAM HCL 5 MG/5ML IJ SOLN
INTRAMUSCULAR | Status: DC | PRN
  Administered 2018-07-01 (×2): 2 mg via INTRAVENOUS
  Administered 2018-07-01 (×2): 3 mg via INTRAVENOUS

## 2018-07-01 MED ORDER — PROTAMINE SULFATE 10 MG/ML IV SOLN
INTRAVENOUS | Status: AC
  Filled 2018-07-01: qty 25

## 2018-07-01 MED ORDER — SODIUM CHLORIDE 0.9 % IV SOLN
250.0000 mL | INTRAVENOUS | Status: DC
  Administered 2018-07-02: 250 mL via INTRAVENOUS

## 2018-07-01 MED ORDER — CHLORHEXIDINE GLUCONATE 0.12 % MT SOLN
15.0000 mL | OROMUCOSAL | Status: AC
  Administered 2018-07-01: 15 mL via OROMUCOSAL

## 2018-07-01 MED ORDER — DOCUSATE SODIUM 100 MG PO CAPS
200.0000 mg | ORAL_CAPSULE | Freq: Every day | ORAL | Status: DC
  Administered 2018-07-02 – 2018-07-04 (×3): 200 mg via ORAL
  Filled 2018-07-01 (×3): qty 2

## 2018-07-01 MED ORDER — ROCURONIUM BROMIDE 50 MG/5ML IV SOSY
PREFILLED_SYRINGE | INTRAVENOUS | Status: AC
  Filled 2018-07-01: qty 10

## 2018-07-01 MED ORDER — LACTATED RINGERS IV SOLN: INTRAVENOUS | Status: DC

## 2018-07-01 MED ORDER — SODIUM CHLORIDE 0.9 % IV SOLN
INTRAVENOUS | Status: DC | PRN
  Administered 2018-07-01: 25 ug/min via INTRAVENOUS
  Administered 2018-07-01: 20 ug/min via INTRAVENOUS

## 2018-07-01 MED ORDER — PHENYLEPHRINE 40 MCG/ML (10ML) SYRINGE FOR IV PUSH (FOR BLOOD PRESSURE SUPPORT)
PREFILLED_SYRINGE | INTRAVENOUS | Status: AC
  Filled 2018-07-01: qty 10

## 2018-07-01 MED ORDER — ASPIRIN EC 325 MG PO TBEC
325.0000 mg | DELAYED_RELEASE_TABLET | Freq: Every day | ORAL | Status: DC
  Administered 2018-07-02 – 2018-07-03 (×2): 325 mg via ORAL
  Filled 2018-07-01 (×3): qty 1

## 2018-07-01 MED ORDER — INSULIN REGULAR(HUMAN) IN NACL 100-0.9 UT/100ML-% IV SOLN
INTRAVENOUS | Status: DC
  Administered 2018-07-01: 1.1 [IU]/h via INTRAVENOUS

## 2018-07-01 SURGICAL SUPPLY — 69 items
BAG DECANTER FOR FLEXI CONT (MISCELLANEOUS) ×3 IMPLANT
BANDAGE ACE 4X5 VEL STRL LF (GAUZE/BANDAGES/DRESSINGS) ×3 IMPLANT
BANDAGE ACE 6X5 VEL STRL LF (GAUZE/BANDAGES/DRESSINGS) ×3 IMPLANT
BANDAGE ELASTIC 4 VELCRO ST LF (GAUZE/BANDAGES/DRESSINGS) ×3 IMPLANT
BANDAGE ELASTIC 6 VELCRO ST LF (GAUZE/BANDAGES/DRESSINGS) ×3 IMPLANT
BLADE STERNUM SYSTEM 6 (BLADE) ×3 IMPLANT
BLADE SURG 11 STRL SS (BLADE) ×3 IMPLANT
BNDG GAUZE ELAST 4 BULKY (GAUZE/BANDAGES/DRESSINGS) ×3 IMPLANT
CANISTER SUCT 3000ML PPV (MISCELLANEOUS) ×3 IMPLANT
CATH CPB KIT GERHARDT (MISCELLANEOUS) ×3 IMPLANT
CATH THORACIC 28FR (CATHETERS) ×3 IMPLANT
CLIP RETRACTION 3.0MM CORONARY (MISCELLANEOUS) ×3 IMPLANT
COVER WAND RF STERILE (DRAPES) ×3 IMPLANT
CRADLE DONUT ADULT HEAD (MISCELLANEOUS) ×3 IMPLANT
DRAIN CHANNEL 28F RND 3/8 FF (WOUND CARE) ×3 IMPLANT
DRAPE CARDIOVASCULAR INCISE (DRAPES) ×1
DRAPE SLUSH/WARMER DISC (DRAPES) ×3 IMPLANT
DRAPE SRG 135X102X78XABS (DRAPES) ×2 IMPLANT
DRSG AQUACEL AG ADV 3.5X14 (GAUZE/BANDAGES/DRESSINGS) ×3 IMPLANT
ELECT BLADE 4.0 EZ CLEAN MEGAD (MISCELLANEOUS) ×3
ELECT REM PT RETURN 9FT ADLT (ELECTROSURGICAL) ×6
ELECTRODE BLDE 4.0 EZ CLN MEGD (MISCELLANEOUS) ×2 IMPLANT
ELECTRODE REM PT RTRN 9FT ADLT (ELECTROSURGICAL) ×4 IMPLANT
FELT TEFLON 1X6 (MISCELLANEOUS) ×3 IMPLANT
GAUZE SPONGE 4X4 12PLY STRL (GAUZE/BANDAGES/DRESSINGS) ×6 IMPLANT
GLOVE BIO SURGEON STRL SZ 6.5 (GLOVE) ×18 IMPLANT
GOWN STRL REUS W/ TWL LRG LVL3 (GOWN DISPOSABLE) ×20 IMPLANT
GOWN STRL REUS W/TWL LRG LVL3 (GOWN DISPOSABLE) ×10
HEMOSTAT POWDER SURGIFOAM 1G (HEMOSTASIS) ×6 IMPLANT
HEMOSTAT SURGICEL 2X14 (HEMOSTASIS) ×9 IMPLANT
KIT BASIN OR (CUSTOM PROCEDURE TRAY) ×3 IMPLANT
KIT CATH SUCT 8FR (CATHETERS) ×3 IMPLANT
KIT SUCTION CATH 14FR (SUCTIONS) ×6 IMPLANT
KIT TURNOVER KIT B (KITS) ×3 IMPLANT
KIT VASOVIEW HEMOPRO 2 VH 4000 (KITS) ×3 IMPLANT
LEAD PACING MYOCARDI (MISCELLANEOUS) ×3 IMPLANT
MARKER GRAFT CORONARY BYPASS (MISCELLANEOUS) ×9 IMPLANT
NS IRRIG 1000ML POUR BTL (IV SOLUTION) ×15 IMPLANT
PACK E OPEN HEART (SUTURE) ×3 IMPLANT
PACK OPEN HEART (CUSTOM PROCEDURE TRAY) ×3 IMPLANT
PAD ARMBOARD 7.5X6 YLW CONV (MISCELLANEOUS) ×6 IMPLANT
PAD ELECT DEFIB RADIOL ZOLL (MISCELLANEOUS) ×3 IMPLANT
PENCIL BUTTON HOLSTER BLD 10FT (ELECTRODE) ×3 IMPLANT
SET CARDIOPLEGIA MPS 5001102 (MISCELLANEOUS) ×3 IMPLANT
SOLUTION ANTI FOG 6CC (MISCELLANEOUS) ×3 IMPLANT
SPONGE LAP 18X18 RF (DISPOSABLE) ×6 IMPLANT
SURGIFLO W/THROMBIN 8M KIT (HEMOSTASIS) ×3 IMPLANT
SUT BONE WAX W31G (SUTURE) ×3 IMPLANT
SUT MNCRL AB 4-0 PS2 18 (SUTURE) ×3 IMPLANT
SUT PROLENE 3 0 SH1 36 (SUTURE) ×3 IMPLANT
SUT PROLENE 4 0 TF (SUTURE) ×6 IMPLANT
SUT PROLENE 6 0 C 1 30 (SUTURE) ×3 IMPLANT
SUT PROLENE 6 0 CC (SUTURE) ×9 IMPLANT
SUT PROLENE 7 0 BV1 MDA (SUTURE) ×6 IMPLANT
SUT PROLENE 7.0 RB 3 (SUTURE) ×9 IMPLANT
SUT STEEL 6MS V (SUTURE) ×3 IMPLANT
SUT STEEL SZ 6 DBL 3X14 BALL (SUTURE) ×3 IMPLANT
SUT VIC AB 1 CTX 18 (SUTURE) ×6 IMPLANT
SUT VIC AB 2-0 CT1 27 (SUTURE) ×1
SUT VIC AB 2-0 CT1 TAPERPNT 27 (SUTURE) ×2 IMPLANT
SYSTEM SAHARA CHEST DRAIN ATS (WOUND CARE) ×3 IMPLANT
TAPE CLOTH SURG 4X10 WHT LF (GAUZE/BANDAGES/DRESSINGS) ×3 IMPLANT
TAPE PAPER 2X10 WHT MICROPORE (GAUZE/BANDAGES/DRESSINGS) ×3 IMPLANT
TOWEL GREEN STERILE (TOWEL DISPOSABLE) ×3 IMPLANT
TOWEL GREEN STERILE FF (TOWEL DISPOSABLE) ×3 IMPLANT
TRAY FOLEY SLVR 16FR TEMP STAT (SET/KITS/TRAYS/PACK) ×3 IMPLANT
TUBING INSUFFLATION (TUBING) ×3 IMPLANT
UNDERPAD 30X30 (UNDERPADS AND DIAPERS) ×3 IMPLANT
WATER STERILE IRR 1000ML POUR (IV SOLUTION) ×6 IMPLANT

## 2018-07-01 NOTE — Brief Op Note (Signed)
      [address]       [place] [address]             [phone]      07/01/2018  2:37 PM  PATIENT:  Randy [name]  72 y.o. male  PRE-OPERATIVE DIAGNOSIS:  CAD  POST-OPERATIVE DIAGNOSIS:  CAD  PROCEDURE:  Procedure(s): CORONARY ARTERY BYPASS GRAFTING (CABG) x 4, LIMA TO LAD, SVG TO OM1, SVG TO DIAG, SVG TO PL, USING LEFT INTERNAL MAMMARY ARTERY AND RIGHT GREATER SAPHENOUS VEIN HARVESTED ENDOSCOPICALLY (N/A) TRANSESOPHAGEAL ECHOCARDIOGRAM (TEE) (N/A)  SURGEON:  Surgeon(s) and Role:    * Grace Isaac, MD - Primary  PHYSICIAN ASSISTANT:  Macarthur Critchley    ANESTHESIA:   general  EBL:  600 mL   BLOOD ADMINISTERED:none  DRAINS: Routine   LOCAL MEDICATIONS USED:  NONE  SPECIMEN:  No Specimen  DISPOSITION OF SPECIMEN:  N/A  COUNTS:  YES  DICTATION: .Dragon Dictation  PLAN OF CARE: Admit to inpatient   PATIENT DISPOSITION:  ICU - intubated and hemodynamically stable.   Delay start of Pharmacological VTE agent (>24hrs) due to surgical blood loss or risk of bleeding: yes

## 2018-07-01 NOTE — Anesthesia Procedure Notes (Addendum)
Central Venous Catheter Insertion Performed by: Audry Pili, MD, anesthesiologist Start/End12/18/2019 8:59 AM, 07/01/2018 9:15 AM Patient location: OR. Preanesthetic checklist: patient identified, IV checked, risks and benefits discussed, surgical consent, monitors and equipment checked, pre-op evaluation, timeout performed and anesthesia consent Position: Trendelenburg Lidocaine 1% used for infiltration and patient sedated Hand hygiene performed , maximum sterile barriers used  and Seldinger technique used Catheter size: 8.5 Fr Central line was placed.MAC introducer Procedure performed using ultrasound guided technique. Ultrasound Notes:anatomy identified, needle tip was noted to be adjacent to the nerve/plexus identified, no ultrasound evidence of intravascular and/or intraneural injection and image(s) printed for medical record Attempts: 2 Following insertion, line sutured, dressing applied and Biopatch. Post procedure assessment: blood return through all ports, no air and free fluid flow  Patient tolerated the procedure well with no immediate complications.

## 2018-07-01 NOTE — Progress Notes (Signed)
      [address]       [place] [address]             [phone]      Pre Procedure note for inpatients:   Randy [name] has been scheduled for Procedure(s): LEFT HEART CATH AND CORONARY ANGIOGRAPHY (N/A) IABP Insertion (Right) Abdominal Aortagram (N/A) today. The various methods of treatment have been discussed with the patient. After consideration of the risks, benefits and treatment options the patient has consented to the planned procedure.   The patient has been seen and labs reviewed. There are no changes in the patient's condition to prevent proceeding with the planned procedure today.  Recent labs:  Lab Results  Component Value Date   WBC 9.9 07/01/2018   HGB 13.3 07/01/2018   HCT 40.5 07/01/2018   PLT 194 07/01/2018   GLUCOSE 112 (H) 07/01/2018   CHOL 250 (H) 06/29/2018   TRIG 62 06/29/2018   HDL 69 06/29/2018   LDLCALC 169 (H) 06/29/2018   ALT 27 06/30/2018   AST 158 (H) 06/30/2018   NA 136 07/01/2018   K 3.7 07/01/2018   CL 104 07/01/2018   CREATININE 0.97 07/01/2018   BUN 20 07/01/2018   CO2 23 07/01/2018   INR 1.21 06/30/2018   HGBA1C 5.4 06/29/2018   IABP in place, doppler able pt pulses bilaterial  Grace Isaac, MD 07/01/2018 7:15 AM

## 2018-07-01 NOTE — Progress Notes (Signed)
CT Surgery  Some dys-synchrony with vent as precedex weaned Hemodynamics stable Lungs clear Wait and try vent wean later

## 2018-07-01 NOTE — Progress Notes (Signed)
  Echocardiogram Echocardiogram Transesophageal has been performed.  Randy [name] L Androw 07/01/2018, 9:56 AM

## 2018-07-01 NOTE — Progress Notes (Signed)
Pt passed weaning parameters and mechanics but could not extubate due to ABG results not being in range.  Patient placed back on full support and RT will try again later.

## 2018-07-01 NOTE — Anesthesia Procedure Notes (Signed)
Arterial Line Insertion Start/End12/18/2019 8:50 AM, 07/01/2018 8:52 AM Performed by: CRNA  Patient location: OR. Preanesthetic checklist: patient identified, IV checked, site marked, risks and benefits discussed, surgical consent, monitors and equipment checked, pre-op evaluation, timeout performed and anesthesia consent Left, radial was placed Catheter size: 20 G Hand hygiene performed , maximum sterile barriers used  and Seldinger technique used Allen's test indicative of satisfactory collateral circulation Attempts: 1 Procedure performed without using ultrasound guided technique. Following insertion, dressing applied and Biopatch. Post procedure assessment: normal and unchanged  Patient tolerated the procedure well with no immediate complications.

## 2018-07-01 NOTE — Progress Notes (Signed)
Report called to anesthesia.  Sherlie Ban, RN

## 2018-07-01 NOTE — Transfer of Care (Signed)
Immediate Anesthesia Transfer of Care Note  Patient: Randy [name]  Procedure(s) Performed: CORONARY ARTERY BYPASS GRAFTING (CABG) x 4, LIMA TO LAD, SVG TO OM1, SVG TO DIAG, SVG TO PL, USING LEFT INTERNAL MAMMARY ARTERY AND RIGHT GREATER SAPHENOUS VEIN HARVESTED ENDOSCOPICALLY (N/A Chest) TRANSESOPHAGEAL ECHOCARDIOGRAM (TEE) (N/A )  Patient Location: SICU  Anesthesia Type:General  Level of Consciousness: sedated, unresponsive and Patient remains intubated per anesthesia plan  Airway & Oxygen Therapy: Patient remains intubated per anesthesia plan and Patient placed on Ventilator (see vital sign flow sheet for setting)  Post-op Assessment: Report given to RN and Post -op Vital signs reviewed and stable  Post vital signs: Reviewed and stable  Last Vitals:  Vitals Value Taken Time  BP 111/73 07/01/2018  2:54 PM  Temp    Pulse 81 07/01/2018  2:57 PM  Resp 12 07/01/2018  2:57 PM  SpO2 96 % 07/01/2018  2:57 PM  Vitals shown include unvalidated device data.  Last Pain:  Vitals:   07/01/18 0724  TempSrc: Oral  PainSc:          Complications: No apparent anesthesia complications

## 2018-07-01 NOTE — Anesthesia Procedure Notes (Signed)
Procedure Name: Intubation Date/Time: 07/01/2018 8:45 AM Performed by: Renato Shin, CRNA Pre-anesthesia Checklist: Patient identified, Emergency Drugs available, Suction available and Patient being monitored Patient Re-evaluated:Patient Re-evaluated prior to induction Oxygen Delivery Method: Circle system utilized Preoxygenation: Pre-oxygenation with 100% oxygen Induction Type: IV induction Ventilation: Mask ventilation without difficulty Laryngoscope Size: Miller and 3 Grade View: Grade II Tube type: Oral Tube size: 7.5 mm Number of attempts: 1 Airway Equipment and Method: Stylet Placement Confirmation: ETT inserted through vocal cords under direct vision,  positive ETCO2 and breath sounds checked- equal and bilateral Secured at: 23 cm Tube secured with: Tape Dental Injury: Teeth and Oropharynx as per pre-operative assessment

## 2018-07-01 NOTE — Progress Notes (Signed)
Removed pink tape that was securing ET tube due to not securing well enough.  Replaced with a ET tube holder for a secure fit.

## 2018-07-01 NOTE — Anesthesia Procedure Notes (Addendum)
Central Venous Catheter Insertion Performed by: Audry Pili, MD, anesthesiologist Start/End12/18/2019 9:12 AM, 07/01/2018 9:15 AM Patient location: OR. Preanesthetic checklist: patient identified, IV checked, risks and benefits discussed, surgical consent, monitors and equipment checked, pre-op evaluation, timeout performed and anesthesia consent Position: Trendelenburg Hand hygiene performed  and maximum sterile barriers used  Total catheter length 10. PA cath was placed.Swan type:thermodilution PA Cath depth:48 Procedure performed without using ultrasound guided technique. Attempts: 1 Patient tolerated the procedure well with no immediate complications.

## 2018-07-01 NOTE — Progress Notes (Signed)
Patient in OR for CABG, will follow up post CABG for further management.  Buford Dresser, MD, PhD [facility]  [address] [place], [place] [address] [phone]

## 2018-07-02 ENCOUNTER — Encounter (HOSPITAL_COMMUNITY): Payer: Self-pay | Admitting: Cardiothoracic Surgery

## 2018-07-02 ENCOUNTER — Inpatient Hospital Stay (HOSPITAL_COMMUNITY): Payer: Medicare PPO

## 2018-07-02 DIAGNOSIS — Z951 Presence of aortocoronary bypass graft: Secondary | ICD-10-CM

## 2018-07-02 LAB — CBC
HCT: 33.3 % — ABNORMAL LOW (ref 39.0–52.0)
HCT: 34 % — ABNORMAL LOW (ref 39.0–52.0)
Hemoglobin: 10.9 g/dL — ABNORMAL LOW (ref 13.0–17.0)
Hemoglobin: 11.4 g/dL — ABNORMAL LOW (ref 13.0–17.0)
MCH: 30.6 pg (ref 26.0–34.0)
MCH: 31.1 pg (ref 26.0–34.0)
MCHC: 32.7 g/dL (ref 30.0–36.0)
MCHC: 33.5 g/dL (ref 30.0–36.0)
MCV: 93.5 fL (ref 80.0–100.0)
MCV: 92.9 fL (ref 80.0–100.0)
PLATELETS: 128 10*3/uL — AB (ref 150–400)
Platelets: 122 10*3/uL — ABNORMAL LOW (ref 150–400)
RBC: 3.56 MIL/uL — ABNORMAL LOW (ref 4.22–5.81)
RBC: 3.66 MIL/uL — ABNORMAL LOW (ref 4.22–5.81)
RDW: 13.7 % (ref 11.5–15.5)
RDW: 13.9 % (ref 11.5–15.5)
WBC: 11.7 10*3/uL — ABNORMAL HIGH (ref 4.0–10.5)
WBC: 13.8 10*3/uL — ABNORMAL HIGH (ref 4.0–10.5)
nRBC: 0 % (ref 0.0–0.2)
nRBC: 0 % (ref 0.0–0.2)

## 2018-07-02 LAB — POCT I-STAT 3, ART BLOOD GAS (G3+)
Acid-base deficit: 4 mmol/L — ABNORMAL HIGH (ref 0.0–2.0)
Bicarbonate: 20.9 mmol/L (ref 20.0–28.0)
O2 Saturation: 94 %
PO2 ART: 75 mmHg — AB (ref 83.0–108.0)
Patient temperature: 37.5
TCO2: 22 mmol/L (ref 22–32)
pCO2 arterial: 38.7 mmHg (ref 32.0–48.0)
pH, Arterial: 7.343 — ABNORMAL LOW (ref 7.350–7.450)

## 2018-07-02 LAB — GLUCOSE, CAPILLARY
Glucose-Capillary: 103 mg/dL — ABNORMAL HIGH (ref 70–99)
Glucose-Capillary: 105 mg/dL — ABNORMAL HIGH (ref 70–99)
Glucose-Capillary: 110 mg/dL — ABNORMAL HIGH (ref 70–99)
Glucose-Capillary: 125 mg/dL — ABNORMAL HIGH (ref 70–99)
Glucose-Capillary: 130 mg/dL — ABNORMAL HIGH (ref 70–99)
Glucose-Capillary: 132 mg/dL — ABNORMAL HIGH (ref 70–99)
Glucose-Capillary: 52 mg/dL — ABNORMAL LOW (ref 70–99)
Glucose-Capillary: 99 mg/dL (ref 70–99)

## 2018-07-02 LAB — POCT I-STAT, CHEM 8
BUN: 17 mg/dL (ref 8–23)
Calcium, Ion: 1.09 mmol/L — ABNORMAL LOW (ref 1.15–1.40)
Chloride: 105 mmol/L (ref 98–111)
Creatinine, Ser: 0.9 mg/dL (ref 0.61–1.24)
Glucose, Bld: 110 mg/dL — ABNORMAL HIGH (ref 70–99)
HCT: 33 % — ABNORMAL LOW (ref 39.0–52.0)
Hemoglobin: 11.2 g/dL — ABNORMAL LOW (ref 13.0–17.0)
POTASSIUM: 4 mmol/L (ref 3.5–5.1)
Sodium: 138 mmol/L (ref 135–145)
TCO2: 24 mmol/L (ref 22–32)

## 2018-07-02 LAB — CREATININE, SERUM
Creatinine, Ser: 1.06 mg/dL (ref 0.61–1.24)
GFR calc Af Amer: >60 mL/min (ref >60)
GFR calc non Af Amer: >60 mL/min (ref >60)

## 2018-07-02 LAB — BASIC METABOLIC PANEL
Anion gap: 9 (ref 5–15)
BUN: 15 mg/dL (ref 8–23)
CO2: 21 mmol/L — ABNORMAL LOW (ref 22–32)
CREATININE: 1.02 mg/dL (ref 0.61–1.24)
Calcium: 7.4 mg/dL — ABNORMAL LOW (ref 8.9–10.3)
Chloride: 107 mmol/L (ref 98–111)
GFR calc Af Amer: >60 mL/min (ref >60)
GFR calc non Af Amer: >60 mL/min (ref >60)
Glucose, Bld: 111 mg/dL — ABNORMAL HIGH (ref 70–99)
Potassium: 4.2 mmol/L (ref 3.5–5.1)
Sodium: 137 mmol/L (ref 135–145)

## 2018-07-02 LAB — MAGNESIUM
MAGNESIUM: 2.6 mg/dL — AB (ref 1.7–2.4)
Magnesium: 2.8 mg/dL — ABNORMAL HIGH (ref 1.7–2.4)

## 2018-07-02 LAB — COOXEMETRY PANEL
Carboxyhemoglobin: 1.5 % (ref 0.5–1.5)
Methemoglobin: 1.5 % (ref 0.0–1.5)
O2 Saturation: 59.8 %
Total hemoglobin: 11.8 g/dL — ABNORMAL LOW (ref 12.0–16.0)

## 2018-07-02 LAB — POCT ACTIVATED CLOTTING TIME: Activated Clotting Time: 142 seconds

## 2018-07-02 MED ORDER — ORAL CARE MOUTH RINSE
15.0000 mL | Freq: Two times a day (BID) | OROMUCOSAL | Status: DC
  Administered 2018-07-02: 15 mL via OROMUCOSAL

## 2018-07-02 MED ORDER — ENOXAPARIN SODIUM 30 MG/0.3ML ~~LOC~~ SOLN
30.0000 mg | Freq: Every day | SUBCUTANEOUS | Status: DC
  Administered 2018-07-02: 30 mg via SUBCUTANEOUS
  Filled 2018-07-02: qty 0.3

## 2018-07-02 MED ORDER — LEVALBUTEROL HCL 0.63 MG/3ML IN NEBU
0.6300 mg | INHALATION_SOLUTION | Freq: Four times a day (QID) | RESPIRATORY_TRACT | Status: DC
  Administered 2018-07-02 – 2018-07-03 (×7): 0.63 mg via RESPIRATORY_TRACT
  Filled 2018-07-02 (×7): qty 3

## 2018-07-02 MED ORDER — INSULIN ASPART 100 UNIT/ML ~~LOC~~ SOLN
0.0000 [IU] | SUBCUTANEOUS | Status: DC
  Administered 2018-07-02 – 2018-07-04 (×3): 2 [IU] via SUBCUTANEOUS

## 2018-07-02 MED ORDER — FUROSEMIDE 10 MG/ML IJ SOLN
40.0000 mg | Freq: Once | INTRAMUSCULAR | Status: AC
  Administered 2018-07-02: 40 mg via INTRAVENOUS
  Filled 2018-07-02: qty 4

## 2018-07-02 MED FILL — Heparin Sodium (Porcine) Inj 1000 Unit/ML: INTRAMUSCULAR | Qty: 30 | Status: AC

## 2018-07-02 MED FILL — Potassium Chloride Inj 2 mEq/ML: INTRAVENOUS | Qty: 40 | Status: AC

## 2018-07-02 MED FILL — Heparin Sodium (Porcine) Inj 1000 Unit/ML: INTRAMUSCULAR | Qty: 2500 | Status: AC

## 2018-07-02 MED FILL — Magnesium Sulfate Inj 50%: INTRAMUSCULAR | Qty: 10 | Status: AC

## 2018-07-02 NOTE — Progress Notes (Signed)
Patient ID: Randy [name], male   DOB: 30-Jan-1946, 72 y.o.   MRN: [id number] [facility] DAILY ICU PROGRESS NOTE                   [address]            [place] [address]          [phone]   1 Day Post-Op Procedure(s) (LRB): CORONARY ARTERY BYPASS GRAFTING (CABG) x 4, LIMA TO LAD, SVG TO OM1, SVG TO DIAG, SVG TO PL, USING LEFT INTERNAL MAMMARY ARTERY AND RIGHT GREATER SAPHENOUS VEIN HARVESTED ENDOSCOPICALLY (N/A) TRANSESOPHAGEAL ECHOCARDIOGRAM (TEE) (N/A)  Total Length of Stay:  LOS: 3 days   Subjective: Patient extubated awake neurologically intact  Objective: Vital signs in last 24 hours: Temp:  [97.5 F (36.4 C)-100.6 F (38.1 C)] 99.3 F (37.4 C) (12/19 0700) Pulse Rate:  [63-98] 89 (12/19 0700) Cardiac Rhythm: Normal sinus rhythm;Atrial paced (12/19 0400) Resp:  [11-29] 29 (12/19 0700) BP: (81-172)/(49-105) 106/76 (12/19 0700) SpO2:  [93 %-100 %] 98 % (12/19 0700) Arterial Line BP: (80-266)/(45-260) 123/60 (12/19 0700) FiO2 (%):  [40 %-50 %] 40 % (12/19 0422) Weight:  [76.4 kg] 76.4 kg (12/19 0600)  Filed Weights   06/29/18 1940 06/30/18 0930 07/02/18 0600  Weight: 70.6 kg 70.6 kg 76.4 kg    Weight change: 5.8 kg   Hemodynamic parameters for last 24 hours: PAP: (21-50)/(9-35) 37/17 CO:  [3.1 L/min-4.3 L/min] 3.8 L/min CI:  [1.8 L/min/m2-2.4 L/min/m2] 2.2 L/min/m2  Intake/Output from previous day: 12/18 0701 - 12/19 0700 In: 5306.1 [I.V.:3732.3; Blood:450; NG/GT:30; IV Piggyback:1093.8] Out: 5852 [Urine:1805; Emesis/NG output:100; Blood:600; Chest Tube:340]  Intake/Output this shift: No intake/output data recorded.  Current Meds: Scheduled Meds: . acetaminophen  1,000 mg Oral Q6H   Or  . acetaminophen (TYLENOL) oral liquid 160 mg/5 mL  1,000 mg Per Tube Q6H  . aspirin EC  325 mg Oral Daily   Or  . aspirin  324 mg Per Tube Daily  . atorvastatin  80 mg Oral q1800  . bisacodyl  10 mg Oral Daily   Or  . bisacodyl  10 mg Rectal Daily  .  chlorhexidine gluconate (MEDLINE KIT)  15 mL Mouth Rinse BID  . Chlorhexidine Gluconate Cloth  6 each Topical Daily  . docusate sodium  200 mg Oral Daily  . Influenza vac split quadrivalent PF  0.5 mL Intramuscular Tomorrow-1000  . insulin aspart  0-24 Units Subcutaneous Q4H  . mouth rinse  15 mL Mouth Rinse Q2H  . metoprolol tartrate  12.5 mg Oral BID   Or  . metoprolol tartrate  12.5 mg Per Tube BID  . [START ON 07/03/2018] pantoprazole  40 mg Oral Daily  . sodium chloride flush  10-40 mL Intracatheter Q12H  . sodium chloride flush  3 mL Intravenous Q12H   Continuous Infusions: . sodium chloride 20 mL/hr at 07/02/18 0600  . sodium chloride    . sodium chloride    . albumin human 12.5 g (07/01/18 2058)  . cefUROXime (ZINACEF)  IV 1.5 g (07/02/18 0610)  . dexmedetomidine (PRECEDEX) IV infusion Stopped (07/01/18 2153)  . DOPamine 2.5 mcg/kg/min (07/02/18 0600)  . lactated ringers    . lactated ringers    . lactated ringers 20 mL/hr at 07/02/18 0600  . milrinone 0.3 mcg/kg/min (07/02/18 0600)  . nitroGLYCERIN    . phenylephrine (NEO-SYNEPHRINE) Adult infusion     PRN Meds:.sodium chloride, albumin human, lactated ringers, metoprolol tartrate, midazolam, morphine injection,  ondansetron (ZOFRAN) IV, oxyCODONE, sodium chloride flush, sodium chloride flush, traMADol  General appearance: alert, cooperative and no distress Neurologic: intact Heart: regular rate and rhythm, S1, S2 normal, no murmur, click, rub or gallop Lungs: diminished breath sounds bibasilar Abdomen: soft, non-tender; bowel sounds normal; no masses,  no organomegaly Extremities: Intra-aortic balloon pump in right femoral artery, dopplerable PT pulses are present bilaterally, DP pulses are absent and have been Wound: Sternum intact  Lab Results: CBC: Recent Labs    07/01/18 2211 07/01/18 2213 07/02/18 0414  WBC 10.5  --  11.7*  HGB 11.6* 11.6* 10.9*  HCT 35.1* 34.0* 33.3*  PLT 120*  --  122*   BMET:    Recent Labs    07/01/18 0434  07/01/18 2213 07/02/18 0414  NA 136   < > 136 137  K 3.7   < > 4.6 4.2  CL 104   < > 106 107  CO2 23  --   --  21*  GLUCOSE 112*   < > 125* 111*  BUN 20   < > 15 15  CREATININE 0.97   < > 0.70 1.02  CALCIUM 8.6*  --   --  7.4*   < > = values in this interval not displayed.    CMET: Lab Results  Component Value Date   WBC 11.7 (H) 07/02/2018   HGB 10.9 (L) 07/02/2018   HCT 33.3 (L) 07/02/2018   PLT 122 (L) 07/02/2018   GLUCOSE 111 (H) 07/02/2018   CHOL 250 (H) 06/29/2018   TRIG 62 06/29/2018   HDL 69 06/29/2018   LDLCALC 169 (H) 06/29/2018   ALT 27 06/30/2018   AST 158 (H) 06/30/2018   NA 137 07/02/2018   K 4.2 07/02/2018   CL 107 07/02/2018   CREATININE 1.02 07/02/2018   BUN 15 07/02/2018   CO2 21 (L) 07/02/2018   INR 1.42 07/01/2018   HGBA1C 5.4 06/29/2018      PT/INR:  Recent Labs    07/01/18 1503  LABPROT 17.2*  INR 1.42   Radiology: Dg Chest Port 1 View  Result Date: 07/02/2018 CLINICAL DATA:  Status post coronary bypass grafting EXAM: PORTABLE CHEST 1 VIEW COMPARISON:  07/01/2018 FINDINGS: Endotracheal tube and nasogastric catheter have been removed in the interval. Swan-Ganz catheter, mediastinal drain and left thoracostomy catheter remain in place. No pneumothorax is seen. Minimal left basilar atelectasis is noted. IMPRESSION: Minimal left basilar atelectasis. Tubes and lines as described. Electronically Signed   By: Inez Catalina M.D.   On: 07/02/2018 07:18   Dg Chest Port 1 View  Result Date: 07/01/2018 CLINICAL DATA:  Status post CABG EXAM: PORTABLE CHEST 1 VIEW COMPARISON:  06/30/2018 FINDINGS: Gordy Councilman catheter with the tip projecting over the right ventricular outflow tract. Endotracheal tube with the tip 3.7 cm above the carina. Nasogastric tube with the tip projecting at the esophagogastric junction; recommend advancing the tube 15 cm. Mediastinal drain is noted. Left-sided chest tube is noted. No pneumothorax. No  focal consolidation or pleural effusion. Stable cardiomediastinal silhouette. Interval CABG. No acute osseous abnormality. IMPRESSION: 1. Swan Ganz catheter with the tip projecting over the right ventricular outflow tract. 2. Endotracheal tube with the tip 3.7 cm above the carina. 3. Nasogastric tube with the tip projecting at the esophagogastric junction; recommend advancing the tube 15 cm. Electronically Signed   By: Kathreen Devoid   On: 07/01/2018 15:15     Assessment/Plan: S/P Procedure(s) (LRB): CORONARY ARTERY BYPASS GRAFTING (CABG) x 4, LIMA  TO LAD, SVG TO OM1, SVG TO DIAG, SVG TO PL, USING LEFT INTERNAL MAMMARY ARTERY AND RIGHT GREATER SAPHENOUS VEIN HARVESTED ENDOSCOPICALLY (N/A) TRANSESOPHAGEAL ECHOCARDIOGRAM (TEE) (N/A) Diuresis Diabetes control Cardiac index is adequate on low-dose milrinone and dopamine, start weaning intra-aortic balloon pump and remove later today When safe from postop bleeding will need to be on anticoagulation as on admission had small bilateral pulmonary emboli. Renal function stable   Randy [name] 07/02/2018 7:51 AM

## 2018-07-02 NOTE — Anesthesia Postprocedure Evaluation (Signed)
Anesthesia Post Note  Patient: Jobe Marker  Procedure(s) Performed: CORONARY ARTERY BYPASS GRAFTING (CABG) x 4, LIMA TO LAD, SVG TO OM1, SVG TO DIAG, SVG TO PL, USING LEFT INTERNAL MAMMARY ARTERY AND RIGHT GREATER SAPHENOUS VEIN HARVESTED ENDOSCOPICALLY (N/A Chest) TRANSESOPHAGEAL ECHOCARDIOGRAM (TEE) (N/A )     Patient location during evaluation: ICU Anesthesia Type: General Level of consciousness: awake Pain management: pain level controlled Vital Signs Assessment: post-procedure vital signs reviewed and stable Respiratory status: spontaneous breathing, nonlabored ventilation, respiratory function stable and patient connected to nasal cannula oxygen Cardiovascular status: blood pressure returned to baseline and stable Postop Assessment: no apparent nausea or vomiting Anesthetic complications: no Comments: Extubated early POD#1, doing well thus far.    Last Vitals:  Vitals:   07/02/18 0856 07/02/18 0900  BP:  (!) 125/57  Pulse: 86 85  Resp:  19  Temp:  37.3 C  SpO2:  98%    Last Pain:  Vitals:   07/02/18 0820  TempSrc:   PainSc: 0-No pain                 Audry Pili

## 2018-07-02 NOTE — Progress Notes (Signed)
Site area: RF IABP catheter and sheath Site Prior to Removal:  Level o Pressure Applied For:25 min Manual:   yes Patient Status During Pull:  stable Post Pull Site:  Level 0 Post Pull Instructions Given:  yes Post Pull Pulses Present: palpable PT Dressing Applied:  clear Bedrest begins @ 1115 Comments: removed by Nathanial Rancher  releived at 10 min  canderson

## 2018-07-02 NOTE — Progress Notes (Signed)
      [address]       [place] [address]             [phone]      POD # 1   No complaints BP 127/81   Pulse 95   Temp 99.5 F (37.5 C)   Resp (!) 32   Ht 5\' 3"  (1.6 m)   Wt 76.4 kg   SpO2 94%   BMI 29.84 kg/m  36/18 CI= 2.57 co-ox= 60  Intake/Output Summary (Last 24 hours) at 07/02/2018 1715 Last data filed at 07/02/2018 1707 Gross per 24 hour  Intake 2080.72 ml  Output 1925 ml  Net 155.72 ml    Continue dopamine and milrinone. Will dc swan and a line and mobilize  [employer]. Roxan Hockey, MD [facility] [phone]

## 2018-07-02 NOTE — Plan of Care (Signed)
  Problem: Cardiac: Goal: Will achieve and/or maintain hemodynamic stability Outcome: Progressing   Hemodynamics maintained with IABP, milrinone, and dopamine  CI >2 Problem: Clinical Measurements: Goal: Postoperative complications will be avoided or minimized Outcome: Progressing   Problem: Urinary Elimination: Goal: Ability to achieve and maintain adequate renal perfusion and functioning will improve Outcome: Progressing  Urine output wnl

## 2018-07-02 NOTE — Procedures (Signed)
Extubation Procedure Note  Patient Details:   Name: Sargon Scouten DOB: 05-Nov-1945 MRN: [id number]   Airway Documentation:    Vent end date: 07/02/18 Vent end time: 0500   Evaluation  O2 sats: stable throughout Complications: No apparent complications Patient did tolerate procedure well. Bilateral Breath Sounds: Clear, Diminished   Patient extubated to 5 lpm [place]. NIF -38  VC 1.5L  Cuff leak noted.  Patient able to vocalize post extubation.  IS X 10. Good effort 500 or >  Catha Brow 07/02/2018, 5:05 AM

## 2018-07-02 NOTE — Progress Notes (Signed)
Progress Note  Patient Name: Randy [name] Date of Encounter: 07/02/2018  Primary Cardiologist: Buford Dresser, MD, PhD (new)--though lives in [place]  Subjective   S/P CABG 12/18. Awake, anxious but tolerating post-op day 1 well.  Inpatient Medications    Scheduled Meds: . acetaminophen  1,000 mg Oral Q6H   Or  . acetaminophen (TYLENOL) oral liquid 160 mg/5 mL  1,000 mg Per Tube Q6H  . aspirin EC  325 mg Oral Daily   Or  . aspirin  324 mg Per Tube Daily  . atorvastatin  80 mg Oral q1800  . bisacodyl  10 mg Oral Daily   Or  . bisacodyl  10 mg Rectal Daily  . chlorhexidine gluconate (MEDLINE KIT)  15 mL Mouth Rinse BID  . Chlorhexidine Gluconate Cloth  6 each Topical Daily  . docusate sodium  200 mg Oral Daily  . Influenza vac split quadrivalent PF  0.5 mL Intramuscular Tomorrow-1000  . insulin aspart  0-24 Units Subcutaneous Q4H  . levalbuterol  0.63 mg Nebulization Q6H  . mouth rinse  15 mL Mouth Rinse Q2H  . metoprolol tartrate  12.5 mg Oral BID   Or  . metoprolol tartrate  12.5 mg Per Tube BID  . [START ON 07/03/2018] pantoprazole  40 mg Oral Daily  . sodium chloride flush  10-40 mL Intracatheter Q12H  . sodium chloride flush  3 mL Intravenous Q12H   Continuous Infusions: . sodium chloride 20 mL/hr at 07/02/18 0900  . sodium chloride 250 mL (07/02/18 0900)  . sodium chloride    . albumin human 12.5 g (07/01/18 2058)  . cefUROXime (ZINACEF)  IV Stopped (07/02/18 0640)  . dexmedetomidine (PRECEDEX) IV infusion Stopped (07/01/18 2153)  . DOPamine 2.5 mcg/kg/min (07/02/18 0900)  . lactated ringers    . lactated ringers    . lactated ringers 20 mL/hr at 07/02/18 0900  . milrinone 0.3 mcg/kg/min (07/02/18 0900)  . nitroGLYCERIN    . phenylephrine (NEO-SYNEPHRINE) Adult infusion     PRN Meds: sodium chloride, albumin human, lactated ringers, metoprolol tartrate, midazolam, morphine injection, ondansetron (ZOFRAN) IV, oxyCODONE, sodium chloride flush, sodium  chloride flush, traMADol   Vital Signs    Vitals:   07/02/18 0800 07/02/18 0823 07/02/18 0856 07/02/18 0900  BP: 105/66   (!) 125/57  Pulse: (!) 44  86 85  Resp: (!) 25   19  Temp: 99 F (37.2 C)   99.1 F (37.3 C)  TempSrc:      SpO2: 96% 97%  98%  Weight:      Height:        Intake/Output Summary (Last 24 hours) at 07/02/2018 0952 Last data filed at 07/02/2018 0900 Gross per 24 hour  Intake 5503.46 ml  Output 2815 ml  Net 2688.46 ml   Filed Weights   06/29/18 1940 06/30/18 0930 07/02/18 0600  Weight: 70.6 kg 70.6 kg 76.4 kg    Telemetry   Initially paced, now SR with occasional PVC - Personally Reviewed  ECG    SR, inferior Q waves - Personally Reviewed  Physical Exam   Swan, most recent numbers: PAP 32/2 with mean of 15  GEN: Resting in bed, Swan in place, IABP in place (about to be pulled) Neck: No JVD Cardiac: RRR, no murmurs, rubs, or gallops. Sternal wound dressed. Respiratory: shallow breathing, mild crackles at bases GI: Soft, nontender, non-distended  MS: No edema; No deformity. Neuro:  Nonfocal  Psych: Normal affect   Labs    Chemistry Recent  Labs  Lab 06/29/18 1035 06/30/18 0318 07/01/18 0434  07/01/18 1341 07/01/18 1459 07/01/18 2211 07/01/18 2213 07/02/18 0414  NA 139 141 136   < > 136 138  --  136 137  K 4.2 4.1 3.7   < > 3.9 3.8  --  4.6 4.2  CL 106 110 104   < > 101  --   --  106 107  CO2 22 17* 23  --   --   --   --   --  21*  GLUCOSE 116* 92 112*   < > 135* 114*  --  125* 111*  BUN '16 16 20   '$ < > 16  --   --  15 15  CREATININE 1.18 1.01 0.97   < > 0.80  --  0.89 0.70 1.02  CALCIUM 8.8* 8.7* 8.6*  --   --   --   --   --  7.4*  PROT 6.8 5.9*  --   --   --   --   --   --   --   ALBUMIN 4.1 3.5  --   --   --   --   --   --   --   AST 85* 158*  --   --   --   --   --   --   --   ALT 20 27  --   --   --   --   --   --   --   ALKPHOS 38 32*  --   --   --   --   --   --   --   BILITOT 1.1 1.5*  --   --   --   --   --   --   --     GFRNONAA >60 >60 >60  --   --   --  >60  --  >60  GFRAA >60 >60 >60  --   --   --  >60  --  >60  ANIONGAP '11 14 9  '$ --   --   --   --   --  9   < > = values in this interval not displayed.     Hematology Recent Labs  Lab 07/01/18 1503 07/01/18 2211 07/01/18 2213 07/02/18 0414  WBC 14.1* 10.5  --  11.7*  RBC 4.20* 3.77*  --  3.56*  HGB 13.0 11.6* 11.6* 10.9*  HCT 38.5* 35.1* 34.0* 33.3*  MCV 91.7 93.1  --  93.5  MCH 31.0 30.8  --  30.6  MCHC 33.8 33.0  --  32.7  RDW 13.6 13.7  --  13.7  PLT 142* 120*  --  122*    Cardiac Enzymes Recent Labs  Lab 06/29/18 1344 06/29/18 2010 06/30/18 0318  TROPONINI 30.55* 46.33* 42.02*    Recent Labs  Lab 06/29/18 1040 06/29/18 1348  TROPIPOC 6.71* 24.18*     BNP Recent Labs  Lab 06/29/18 1344 06/30/18 0318  BNP 665.4* 1,369.9*     DDimer  Recent Labs  Lab 06/29/18 1035  DDIMER 0.93*     Radiology    Dg Chest Port 1 View  Result Date: 07/02/2018 CLINICAL DATA:  Status post coronary bypass grafting EXAM: PORTABLE CHEST 1 VIEW COMPARISON:  07/01/2018 FINDINGS: Endotracheal tube and nasogastric catheter have been removed in the interval. Swan-Ganz catheter, mediastinal drain and left thoracostomy catheter remain in place. No pneumothorax is seen. Minimal left basilar atelectasis  is noted. IMPRESSION: Minimal left basilar atelectasis. Tubes and lines as described. Electronically Signed   By: Inez Catalina M.D.   On: 07/02/2018 07:18   Dg Chest Port 1 View  Result Date: 07/01/2018 CLINICAL DATA:  Status post CABG EXAM: PORTABLE CHEST 1 VIEW COMPARISON:  06/30/2018 FINDINGS: Gordy Councilman catheter with the tip projecting over the right ventricular outflow tract. Endotracheal tube with the tip 3.7 cm above the carina. Nasogastric tube with the tip projecting at the esophagogastric junction; recommend advancing the tube 15 cm. Mediastinal drain is noted. Left-sided chest tube is noted. No pneumothorax. No focal consolidation or  pleural effusion. Stable cardiomediastinal silhouette. Interval CABG. No acute osseous abnormality. IMPRESSION: 1. Swan Ganz catheter with the tip projecting over the right ventricular outflow tract. 2. Endotracheal tube with the tip 3.7 cm above the carina. 3. Nasogastric tube with the tip projecting at the esophagogastric junction; recommend advancing the tube 15 cm. Electronically Signed   By: Kathreen Devoid   On: 07/01/2018 15:15   Vas US Doppler Pre Cabg  Result Date: 06/30/2018 PREOPERATIVE VASCULAR EVALUATION  Indications:      Pre-CABG evaluation. Risk Factors:     Hypertension. Other Factors:    History of EtoH and tobacco use. Limitations:      Patient is on balloon-pump Comparison Study: No prior study available. Performing Technologist: Landry Mellow, [place] RDMS RVT  Examination Guidelines: A complete evaluation includes B-mode imaging, spectral Doppler, color Doppler, and power Doppler as needed of all accessible portions of each vessel. Bilateral testing is considered an integral part of a complete examination. Limited examinations for reoccurring indications may be performed as noted.  Right Carotid Findings: +----------+--------+--------+--------+-------------------------------+--------+           PSV cm/sEDV cm/sStenosisDescribe                       Comments +----------+--------+--------+--------+-------------------------------+--------+ CCA Prox                                                         patent   +----------+--------+--------+--------+-------------------------------+--------+ ICA Prox                          focal, hyperechoic and calcificpatent   +----------+--------+--------+--------+-------------------------------+--------+ ICA Mid                                                          patent   +----------+--------+--------+--------+-------------------------------+--------+ ICA Distal                                                       patent    +----------+--------+--------+--------+-------------------------------+--------+ ECA  patent   +----------+--------+--------+--------+-------------------------------+--------+ Portions of this table do not appear on this page. +----------+--------+-------+-------------------+------------+           PSV cm/sEDV cmsDescribe           Arm Pressure +----------+--------+-------+-------------------+------------+ Subclavian               focal calcification             +----------+--------+-------+-------------------+------------+ +---------+--------+--------+------+ VertebralPSV cm/sEDV cm/spatent +---------+--------+--------+------+ Left Carotid Findings: +----------+--------+--------+--------+------------------------+--------+           PSV cm/sEDV cm/sStenosisDescribe                Comments +----------+--------+--------+--------+------------------------+--------+ CCA Prox                                                  patent   +----------+--------+--------+--------+------------------------+--------+ ICA Prox                          diffuse and heterogenouspatent   +----------+--------+--------+--------+------------------------+--------+ ICA Mid                                                   patent   +----------+--------+--------+--------+------------------------+--------+ ICA Distal                                                patent   +----------+--------+--------+--------+------------------------+--------+ ECA                                                       patent   +----------+--------+--------+--------+------------------------+--------+ +---------+--------+--------+------+ VertebralPSV cm/sEDV cm/spatent +---------+--------+--------+------+  ABI Findings: ABI unable to obtain due to patient has bilateral PE per CTA on 06/29/2018, patient is on balloon pump during the  exam, waveforms are not accurate.  Summary: Right Carotid: ICA is patent. Left Carotid: ICA is patent. Vertebrals: Bilateral vertebral arteries demonstrate antegrade flow. Right Upper Extremity: Doppler waveforms remain within normal limits with right radial compression. Doppler waveforms remain within normal limits with right ulnar compression. Left Upper Extremity: Doppler waveforms remain within normal limits with left radial compression. Doppler waveforms decrease 50% with left ulnar compression.  Electronically signed by Deitra Mayo MD on 06/30/2018 at 4:35:45 PM.    Final     Cardiac Studies   Cath 06/29/18 (emergent)  Mid RCA lesion is 100% stenosed.  Mid LM to Dist LM lesion is 30% stenosed.  Prox LAD lesion is 100% stenosed.  Ost 2nd Mrg lesion is 100% stenosed.  LV end diastolic pressure is moderately elevated.  There is moderate to severe left ventricular systolic dysfunction.   1.  Severe three-vessel coronary artery disease likely with recent occlusion of the RCA 2.  Total occlusion of the mid RCA, proximal LAD, and second obtuse marginal, all vessels supplied by collateral branches 3.  Moderately severe LV systolic dysfunction with LVEF estimated at 35% with akinesis of the anterolateral wall and basal inferior walls 4.  Diffuse aortoiliac disease without evidence of abdominal aortic aneurysm 5.  Successful placement of an intra-aortic balloon pump via right femoral artery access  Recommendations: Once the patient is stabilized, I think he would likely benefit from surgical revascularization for treatment of his critical multivessel coronary artery disease.  A TCTS consult will be placed.  Echo 06/30/18 Study Conclusions  - Left ventricle: The cavity size was mildly dilated. There was   moderate concentric hypertrophy. Systolic function was mildly to   moderately reduced. The estimated ejection fraction was in the   range of 40% to 45%. Doppler parameters are  consistent with   abnormal left ventricular relaxation (grade 1 diastolic   dysfunction). Doppler parameters are consistent with elevated   ventricular end-diastolic filling pressure. - Aortic valve: Trileaflet; mildly thickened, mildly calcified   leaflets. Transvalvular velocity was within the normal range.   There was no stenosis. - Mitral valve: Calcified annulus. Mildly thickened leaflets .   There was moderate regurgitation directed posteriorly. - Left atrium: The atrium was normal in size. - Right ventricle: Systolic function was normal. - Tricuspid valve: There was mild regurgitation. - Pulmonary arteries: Systolic pressure was within the normal   range. - Inferior vena cava: The vessel was normal in size. - Pericardium, extracardiac: There was no pericardial effusion.  Impressions:  - LVEF 40-45% with akinesis in the basal and mid inferior,   inferolateral walls and in the apical lateral wall.  Patient Profile     72 y.o. male without significant PMH (no doctor in 53 years) but 79 years of tobacco use presented with NSTEMI; initially pain free despite very elevated troponin, but had an acute episode of decompensation on the evening of 06/29/18 requiring urgent catheterization. Found to have severe multivessel disease, IABP place, underwent CABG 07/01/18.  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 Servando Snare on 07/01/18 (LIMA-LAG, SVG-OM1, SVG-Diag, SVG-PL) -ECG with inferior Qs -Risk factors: long history of tobacco use. No diabetes (A1c normal at 5.4). Elevated LDL of 169 -on aspirin 325 mg post CABG, atorvastatin 80 mg -on metoprolol 12.5 mg BID -given NSTEMI, should be on clopidogrel for 12 mos post CABG (with decrease to aspirin 81 mg). Would add prior to discharge. -IABP being pulled now  Hypertension: immediate post op period, monitoring -holding anything beyond metoprolol given pressor wean -likely will need  ACEi/ARB and spironolactone, will start after surgery when able  Hyperlipidemia: LDL 169, Tchol 250, HDL 69 -started on atorvastatin 80 mg  Tobacco abuse: counseled, plans to quit now, declines smoking cessation aid at this time  Small bilateral PE -suspect this was provoked with travel, so post CABG when appropriate would transition to anticoagulation. Will need 3 months of AC.  For questions or updates, please contact [place] Please consult [email or web address] for contact info under   CRITICAL CARE Total critical care time: 25 minutes. This time includes gathering of history, evaluation of patient's response to treatment, examination of patient, review of laboratory and imaging studies, and coordination with consultants.     Signed, Buford Dresser, MD  07/02/2018, 9:52 AM

## 2018-07-03 ENCOUNTER — Inpatient Hospital Stay (HOSPITAL_COMMUNITY): Payer: Medicare PPO

## 2018-07-03 LAB — POCT I-STAT, CHEM 8
BUN: 18 mg/dL (ref 8–23)
BUN: 19 mg/dL (ref 8–23)
Calcium, Ion: 1.09 mmol/L — ABNORMAL LOW (ref 1.15–1.40)
Calcium, Ion: 1.13 mmol/L — ABNORMAL LOW (ref 1.15–1.40)
Chloride: 99 mmol/L (ref 98–111)
Chloride: 98 mmol/L (ref 98–111)
Creatinine, Ser: 0.9 mg/dL (ref 0.61–1.24)
Creatinine, Ser: 0.9 mg/dL (ref 0.61–1.24)
Glucose, Bld: 131 mg/dL — ABNORMAL HIGH (ref 70–99)
Glucose, Bld: 127 mg/dL — ABNORMAL HIGH (ref 70–99)
HCT: 31 % — ABNORMAL LOW (ref 39.0–52.0)
HEMATOCRIT: 33 % — AB (ref 39.0–52.0)
Hemoglobin: 10.5 g/dL — ABNORMAL LOW (ref 13.0–17.0)
Hemoglobin: 11.2 g/dL — ABNORMAL LOW (ref 13.0–17.0)
Potassium: 3.6 mmol/L (ref 3.5–5.1)
Potassium: 3.8 mmol/L (ref 3.5–5.1)
Sodium: 135 mmol/L (ref 135–145)
Sodium: 135 mmol/L (ref 135–145)
TCO2: 28 mmol/L (ref 22–32)
TCO2: 28 mmol/L (ref 22–32)

## 2018-07-03 LAB — COOXEMETRY PANEL
Carboxyhemoglobin: 1.5 % (ref 0.5–1.5)
Carboxyhemoglobin: 1.4 % (ref 0.5–1.5)
Methemoglobin: 1.7 % — ABNORMAL HIGH (ref 0.0–1.5)
Methemoglobin: 1.5 % (ref 0.0–1.5)
O2 Saturation: 63.4 %
O2 Saturation: 64.8 %
Total hemoglobin: 11.2 g/dL — ABNORMAL LOW (ref 12.0–16.0)
Total hemoglobin: 11.6 g/dL — ABNORMAL LOW (ref 12.0–16.0)

## 2018-07-03 LAB — BASIC METABOLIC PANEL
Anion gap: 9 (ref 5–15)
BUN: 18 mg/dL (ref 8–23)
CO2: 26 mmol/L (ref 22–32)
Calcium: 7.9 mg/dL — ABNORMAL LOW (ref 8.9–10.3)
Chloride: 103 mmol/L (ref 98–111)
Creatinine, Ser: 1.06 mg/dL (ref 0.61–1.24)
GFR calc Af Amer: >60 mL/min (ref >60)
GFR calc non Af Amer: >60 mL/min (ref >60)
Glucose, Bld: 113 mg/dL — ABNORMAL HIGH (ref 70–99)
Potassium: 3.6 mmol/L (ref 3.5–5.1)
Sodium: 138 mmol/L (ref 135–145)

## 2018-07-03 LAB — CBC
HCT: 32.9 % — ABNORMAL LOW (ref 39.0–52.0)
Hemoglobin: 10.9 g/dL — ABNORMAL LOW (ref 13.0–17.0)
MCH: 31.1 pg (ref 26.0–34.0)
MCHC: 33.1 g/dL (ref 30.0–36.0)
MCV: 94 fL (ref 80.0–100.0)
Platelets: 113 10*3/uL — ABNORMAL LOW (ref 150–400)
RBC: 3.5 MIL/uL — ABNORMAL LOW (ref 4.22–5.81)
RDW: 13.8 % (ref 11.5–15.5)
WBC: 11.8 10*3/uL — ABNORMAL HIGH (ref 4.0–10.5)
nRBC: 0 % (ref 0.0–0.2)

## 2018-07-03 LAB — GLUCOSE, CAPILLARY
GLUCOSE-CAPILLARY: 125 mg/dL — AB (ref 70–99)
Glucose-Capillary: 100 mg/dL — ABNORMAL HIGH (ref 70–99)
Glucose-Capillary: 116 mg/dL — ABNORMAL HIGH (ref 70–99)
Glucose-Capillary: 119 mg/dL — ABNORMAL HIGH (ref 70–99)
Glucose-Capillary: 96 mg/dL (ref 70–99)
Glucose-Capillary: 117 mg/dL — ABNORMAL HIGH (ref 70–99)

## 2018-07-03 MED ORDER — AMIODARONE HCL IN DEXTROSE 360-4.14 MG/200ML-% IV SOLN
30.0000 mg/h | INTRAVENOUS | Status: AC
  Filled 2018-07-03: qty 200

## 2018-07-03 MED ORDER — AMIODARONE LOAD VIA INFUSION
150.0000 mg | Freq: Once | INTRAVENOUS | Status: AC
  Administered 2018-07-03: 150 mg via INTRAVENOUS

## 2018-07-03 MED ORDER — METOPROLOL TARTRATE 25 MG/10 ML ORAL SUSPENSION: 25.0000 mg | Freq: Two times a day (BID) | ORAL | Status: DC

## 2018-07-03 MED ORDER — POTASSIUM CHLORIDE 10 MEQ/50ML IV SOLN
10.0000 meq | INTRAVENOUS | Status: AC
  Administered 2018-07-03 (×3): 10 meq via INTRAVENOUS
  Filled 2018-07-03: qty 50

## 2018-07-03 MED ORDER — AMIODARONE HCL IN DEXTROSE 360-4.14 MG/200ML-% IV SOLN: 30.0000 mg/h | INTRAVENOUS | Status: DC

## 2018-07-03 MED ORDER — AMIODARONE HCL IN DEXTROSE 360-4.14 MG/200ML-% IV SOLN: 60.0000 mg/h | INTRAVENOUS | Status: AC

## 2018-07-03 MED ORDER — FUROSEMIDE 10 MG/ML IJ SOLN
40.0000 mg | Freq: Once | INTRAMUSCULAR | Status: AC
  Administered 2018-07-03: 40 mg via INTRAVENOUS
  Filled 2018-07-03: qty 4

## 2018-07-03 MED ORDER — POTASSIUM CHLORIDE CRYS ER 10 MEQ PO TBCR
20.0000 meq | EXTENDED_RELEASE_TABLET | ORAL | Status: AC | PRN
  Administered 2018-07-03 – 2018-07-04 (×3): 20 meq via ORAL
  Filled 2018-07-03: qty 2

## 2018-07-03 MED ORDER — AMIODARONE HCL 200 MG PO TABS
400.0000 mg | ORAL_TABLET | Freq: Two times a day (BID) | ORAL | Status: DC
  Administered 2018-07-04 (×2): 400 mg via ORAL
  Filled 2018-07-03 (×2): qty 2

## 2018-07-03 MED ORDER — AMIODARONE HCL IN DEXTROSE 360-4.14 MG/200ML-% IV SOLN: 60.0000 mg/h | INTRAVENOUS | Status: DC

## 2018-07-03 MED ORDER — LEVALBUTEROL HCL 0.63 MG/3ML IN NEBU: 0.6300 mg | INHALATION_SOLUTION | Freq: Four times a day (QID) | RESPIRATORY_TRACT | Status: DC | PRN

## 2018-07-03 MED ORDER — FUROSEMIDE 10 MG/ML IJ SOLN
40.0000 mg | Freq: Once | INTRAMUSCULAR | Status: DC
  Filled 2018-07-03: qty 4

## 2018-07-03 MED ORDER — LEVALBUTEROL HCL 0.63 MG/3ML IN NEBU
0.6300 mg | INHALATION_SOLUTION | Freq: Four times a day (QID) | RESPIRATORY_TRACT | Status: DC
  Administered 2018-07-04 (×3): 0.63 mg via RESPIRATORY_TRACT
  Filled 2018-07-03 (×3): qty 3

## 2018-07-03 MED ORDER — FUROSEMIDE 10 MG/ML IJ SOLN
INTRAMUSCULAR | Status: AC
  Administered 2018-07-03: 40 mg
  Filled 2018-07-03: qty 4

## 2018-07-03 MED ORDER — AMIODARONE HCL 200 MG PO TABS: 400.0000 mg | ORAL_TABLET | Freq: Every day | ORAL | Status: DC

## 2018-07-03 MED ORDER — AMIODARONE HCL IN DEXTROSE 360-4.14 MG/200ML-% IV SOLN
INTRAVENOUS | Status: AC
  Administered 2018-07-03: 150 mg via INTRAVENOUS
  Filled 2018-07-03: qty 400

## 2018-07-03 MED ORDER — AMIODARONE LOAD VIA INFUSION
150.0000 mg | Freq: Once | INTRAVENOUS | Status: DC
  Filled 2018-07-03: qty 83.34

## 2018-07-03 MED ORDER — METOPROLOL TARTRATE 25 MG PO TABS
25.0000 mg | ORAL_TABLET | Freq: Two times a day (BID) | ORAL | Status: DC
  Administered 2018-07-03 – 2018-07-04 (×3): 25 mg via ORAL
  Filled 2018-07-03 (×3): qty 1

## 2018-07-03 MED ORDER — ENOXAPARIN SODIUM 40 MG/0.4ML ~~LOC~~ SOLN
40.0000 mg | Freq: Every day | SUBCUTANEOUS | Status: DC
  Administered 2018-07-03: 40 mg via SUBCUTANEOUS
  Filled 2018-07-03: qty 0.4

## 2018-07-03 NOTE — Progress Notes (Signed)
TCTS DAILY ICU PROGRESS NOTE                   [address]            [place] [address]          [phone]   2 Days Post-Op Procedure(s) (LRB): CORONARY ARTERY BYPASS GRAFTING (CABG) x 4, LIMA TO LAD, SVG TO OM1, SVG TO DIAG, SVG TO PL, USING LEFT INTERNAL MAMMARY ARTERY AND RIGHT GREATER SAPHENOUS VEIN HARVESTED ENDOSCOPICALLY (N/A) TRANSESOPHAGEAL ECHOCARDIOGRAM (TEE) (N/A)  Total Length of Stay:  LOS: 4 days   Subjective: Patient up in chair, mildly dyspneic, there is right buttocks sore from sitting in the chair  Objective: Vital signs in last 24 hours: Temp:  [98.1 F (36.7 C)-99.7 F (37.6 C)] 98.4 F (36.9 C) (12/20 0400) Pulse Rate:  [44-109] 94 (12/20 0700) Cardiac Rhythm: Normal sinus rhythm (12/20 0600) Resp:  [14-32] 23 (12/20 0700) BP: (105-141)/(57-94) 125/81 (12/20 0700) SpO2:  [86 %-98 %] 92 % (12/20 0700) Arterial Line BP: (104-143)/(53-74) 136/65 (12/19 1700) Weight:  [73.3 kg] 73.3 kg (12/20 0600)  Filed Weights   06/30/18 0930 07/02/18 0600 07/03/18 0600  Weight: 70.6 kg 76.4 kg 73.3 kg    Weight change: -3.1 kg   Hemodynamic parameters for last 24 hours: PAP: (27-53)/(2-21) 45/15 CO:  [4.2 L/min-5.1 L/min] 4.5 L/min CI:  [2.6 L/min/m2-2.9 L/min/m2] 2.6 L/min/m2  Intake/Output from previous day: 12/19 0701 - 12/20 0700 In: 1959.7 [P.O.:1020; I.V.:759.1; IV Piggyback:180.6] Out: 2450 [Urine:2230; Chest Tube:220]  Intake/Output this shift: No intake/output data recorded.  Current Meds: Scheduled Meds: . acetaminophen  1,000 mg Oral Q6H   Or  . acetaminophen (TYLENOL) oral liquid 160 mg/5 mL  1,000 mg Per Tube Q6H  . aspirin EC  325 mg Oral Daily   Or  . aspirin  324 mg Per Tube Daily  . atorvastatin  80 mg Oral q1800  . bisacodyl  10 mg Oral Daily   Or  . bisacodyl  10 mg Rectal Daily  . Chlorhexidine Gluconate Cloth  6 each Topical Daily  . docusate sodium  200 mg Oral Daily  . enoxaparin (LOVENOX) injection  30  mg Subcutaneous QHS  . Influenza vac split quadrivalent PF  0.5 mL Intramuscular Tomorrow-1000  . insulin aspart  0-24 Units Subcutaneous Q4H  . levalbuterol  0.63 mg Nebulization Q6H  . mouth rinse  15 mL Mouth Rinse BID  . metoprolol tartrate  12.5 mg Oral BID   Or  . metoprolol tartrate  12.5 mg Per Tube BID  . pantoprazole  40 mg Oral Daily  . sodium chloride flush  10-40 mL Intracatheter Q12H  . sodium chloride flush  3 mL Intravenous Q12H   Continuous Infusions: . sodium chloride Stopped (07/02/18 1812)  . sodium chloride 250 mL (07/02/18 0900)  . sodium chloride Stopped (07/02/18 1754)  . dexmedetomidine (PRECEDEX) IV infusion Stopped (07/01/18 2153)  . DOPamine 2.5 mcg/kg/min (07/03/18 0700)  . lactated ringers    . lactated ringers    . lactated ringers Stopped (07/03/18 0549)  . milrinone 0.3 mcg/kg/min (07/03/18 0700)  . nitroGLYCERIN    . phenylephrine (NEO-SYNEPHRINE) Adult infusion    . potassium chloride 50 mL/hr at 07/03/18 0700   PRN Meds:.sodium chloride, lactated ringers, metoprolol tartrate, midazolam, morphine injection, ondansetron (ZOFRAN) IV, oxyCODONE, sodium chloride flush, sodium chloride flush, traMADol  General appearance: alert and cooperative Neurologic: intact Heart: regular rate and rhythm, S1, S2 normal, no  murmur, click, rub or gallop Lungs: diminished breath sounds bibasilar Abdomen: soft, non-tender; bowel sounds normal; no masses,  no organomegaly Extremities: extremities normal, atraumatic, no cyanosis or edema and Homans sign is negative, no sign of DVT Wound: Sternal dressing in place sternum stable,  Feet warm palpable PT pulses bilaterally, dressing intact right groin, gluteal insertion site appears intact Lab Results: CBC: Recent Labs    07/02/18 1626 07/02/18 1634 07/03/18 0425  WBC 13.8*  --  11.8*  HGB 11.4* 11.2* 10.9*  HCT 34.0* 33.0* 32.9*  PLT 128*  --  113*   BMET:  Recent Labs    07/02/18 0414  07/02/18 1634  07/03/18 0425  NA 137  --  138 138  K 4.2  --  4.0 3.6  CL 107  --  105 103  CO2 21*  --   --  26  GLUCOSE 111*  --  110* 113*  BUN 15  --  17 18  CREATININE 1.02   < > 0.90 1.06  CALCIUM 7.4*  --   --  7.9*   < > = values in this interval not displayed.    CMET: Lab Results  Component Value Date   WBC 11.8 (H) 07/03/2018   HGB 10.9 (L) 07/03/2018   HCT 32.9 (L) 07/03/2018   PLT 113 (L) 07/03/2018   GLUCOSE 113 (H) 07/03/2018   CHOL 250 (H) 06/29/2018   TRIG 62 06/29/2018   HDL 69 06/29/2018   LDLCALC 169 (H) 06/29/2018   ALT 27 06/30/2018   AST 158 (H) 06/30/2018   NA 138 07/03/2018   K 3.6 07/03/2018   CL 103 07/03/2018   CREATININE 1.06 07/03/2018   BUN 18 07/03/2018   CO2 26 07/03/2018   INR 1.42 07/01/2018   HGBA1C 5.4 06/29/2018      PT/INR:  Recent Labs    07/01/18 1503  LABPROT 17.2*  INR 1.42   Radiology: No results found.   Assessment/Plan: S/P Procedure(s) (LRB): CORONARY ARTERY BYPASS GRAFTING (CABG) x 4, LIMA TO LAD, SVG TO OM1, SVG TO DIAG, SVG TO PL, USING LEFT INTERNAL MAMMARY ARTERY AND RIGHT GREATER SAPHENOUS VEIN HARVESTED ENDOSCOPICALLY (N/A) TRANSESOPHAGEAL ECHOCARDIOGRAM (TEE) (N/A) Mobilize Diuresis Remains on low-dose milrinone and dopamine, renal function stable Expected Acute  Blood - loss Anemia- continue to monitor  DC mediastinal tube, intra-aortic balloon pump out yesterday Check cox this am   Grace Isaac 07/03/2018 7:23 AM

## 2018-07-03 NOTE — Progress Notes (Signed)
Progress Note  Patient Name: Randy [name] Date of Encounter: 07/03/2018  Primary Cardiologist: Buford Dresser, MD, PhD (new)  Subjective   Sitting in chair this AM. Some sternal pain but tolerating well. Tolerating PO. NO BM or gas yet. Mild sore on backside but likes sitting up. Discussed follow up plans; with family in [place], he plans to remain in the area for at least the next several months, possibly permanently, and wants his follow up in [place].  Inpatient Medications    Scheduled Meds: . acetaminophen  1,000 mg Oral Q6H   Or  . acetaminophen (TYLENOL) oral liquid 160 mg/5 mL  1,000 mg Per Tube Q6H  . aspirin EC  325 mg Oral Daily   Or  . aspirin  324 mg Per Tube Daily  . atorvastatin  80 mg Oral q1800  . bisacodyl  10 mg Oral Daily   Or  . bisacodyl  10 mg Rectal Daily  . Chlorhexidine Gluconate Cloth  6 each Topical Daily  . docusate sodium  200 mg Oral Daily  . enoxaparin (LOVENOX) injection  40 mg Subcutaneous QHS  . Influenza vac split quadrivalent PF  0.5 mL Intramuscular Tomorrow-1000  . insulin aspart  0-24 Units Subcutaneous Q4H  . levalbuterol  0.63 mg Nebulization Q6H  . mouth rinse  15 mL Mouth Rinse BID  . metoprolol tartrate  12.5 mg Oral BID   Or  . metoprolol tartrate  12.5 mg Per Tube BID  . pantoprazole  40 mg Oral Daily  . sodium chloride flush  10-40 mL Intracatheter Q12H  . sodium chloride flush  3 mL Intravenous Q12H   Continuous Infusions: . sodium chloride Stopped (07/02/18 1812)  . sodium chloride 250 mL (07/02/18 0900)  . sodium chloride Stopped (07/02/18 1754)  . dexmedetomidine (PRECEDEX) IV infusion Stopped (07/01/18 2153)  . DOPamine 2.5 mcg/kg/min (07/03/18 0800)  . lactated ringers    . lactated ringers    . lactated ringers Stopped (07/03/18 0549)  . milrinone 0.3 mcg/kg/min (07/03/18 0800)  . nitroGLYCERIN    . phenylephrine (NEO-SYNEPHRINE) Adult infusion     PRN Meds: sodium chloride, lactated ringers,  metoprolol tartrate, midazolam, morphine injection, ondansetron (ZOFRAN) IV, oxyCODONE, sodium chloride flush, sodium chloride flush, traMADol   Vital Signs    Vitals:   07/03/18 0600 07/03/18 0700 07/03/18 0800 07/03/18 0825  BP: 110/72 125/81 131/82 131/82  Pulse: 93 94 (!) 114 98  Resp: (!) 26 (!) 23 (!) 28 (!) 32  Temp:  98 F (36.7 C)    TempSrc:  Oral    SpO2: 90% 92% (!) 89% 97%  Weight: 73.3 kg     Height:        Intake/Output Summary (Last 24 hours) at 07/03/2018 0924 Last data filed at 07/03/2018 0813 Gross per 24 hour  Intake 1888.99 ml  Output 2775 ml  Net -886.01 ml   Filed Weights   06/30/18 0930 07/02/18 0600 07/03/18 0600  Weight: 70.6 kg 76.4 kg 73.3 kg    Telemetry   SR - Personally Reviewed  ECG    12/19 SR, inferior Q waves - Personally Reviewed  Physical Exam   GEN: Sitting in chair, eating broth.  Neck: No JVD Introducer in R IJ Cardiac: RRR, no murmurs, rubs, or gallops. Sternal wound dressed. Respiratory: shallow breathing, mild crackles at bases GI: Soft, nontender, non-distended  MS: No edema; No deformity. Neuro:  Nonfocal  Psych: Normal affect   Labs    Chemistry Recent Labs  Lab 06/29/18 1035 06/30/18 0318 07/01/18 0434  07/02/18 0414 07/02/18 1626 07/02/18 1634 07/03/18 0425  NA 139 141 136   < > 137  --  138 138  K 4.2 4.1 3.7   < > 4.2  --  4.0 3.6  CL 106 110 104   < > 107  --  105 103  CO2 22 17* 23  --  21*  --   --  26  GLUCOSE 116* 92 112*   < > 111*  --  110* 113*  BUN 16 16 20    < > 15  --  17 18  CREATININE 1.18 1.01 0.97   < > 1.02 1.06 0.90 1.06  CALCIUM 8.8* 8.7* 8.6*  --  7.4*  --   --  7.9*  PROT 6.8 5.9*  --   --   --   --   --   --   ALBUMIN 4.1 3.5  --   --   --   --   --   --   AST 85* 158*  --   --   --   --   --   --   ALT 20 27  --   --   --   --   --   --   ALKPHOS 38 32*  --   --   --   --   --   --   BILITOT 1.1 1.5*  --   --   --   --   --   --   GFRNONAA >60 >60 >60   < > >60 >60  --   >60  GFRAA >60 >60 >60   < > >60 >60  --  >60  ANIONGAP 11 14 9   --  9  --   --  9   < > = values in this interval not displayed.     Hematology Recent Labs  Lab 07/02/18 0414 07/02/18 1626 07/02/18 1634 07/03/18 0425  WBC 11.7* 13.8*  --  11.8*  RBC 3.56* 3.66*  --  3.50*  HGB 10.9* 11.4* 11.2* 10.9*  HCT 33.3* 34.0* 33.0* 32.9*  MCV 93.5 92.9  --  94.0  MCH 30.6 31.1  --  31.1  MCHC 32.7 33.5  --  33.1  RDW 13.7 13.9  --  13.8  PLT 122* 128*  --  113*    Cardiac Enzymes Recent Labs  Lab 06/29/18 1344 06/29/18 2010 06/30/18 0318  TROPONINI 30.55* 46.33* 42.02*    Recent Labs  Lab 06/29/18 1040 06/29/18 1348  TROPIPOC 6.71* 24.18*     BNP Recent Labs  Lab 06/29/18 1344 06/30/18 0318  BNP 665.4* 1,369.9*     DDimer  Recent Labs  Lab 06/29/18 1035  DDIMER 0.93*     Radiology    Dg Chest Port 1 View  Result Date: 07/03/2018 CLINICAL DATA:  Status post coronary artery bypass grafting. Chest tube present. EXAM: PORTABLE CHEST 1 VIEW COMPARISON:  July 02, 2018 FINDINGS: The Swan-Ganz catheter is been removed. Cordis tip remains in the superior vena cava. There is a chest tube on the left. There is a mediastinal drain. Temporary pacemaker wires are attached to the right heart. No pneumothorax evident. There is elevation of the left hemidiaphragm with a small left pleural effusion. There is atelectatic change in the left base and right mid lung regions. There is no frank consolidation. Heart is mildly enlarged with pulmonary vascularity normal. No evident adenopathy. No  bone lesions. IMPRESSION: Tube and catheter positions as described without pneumothorax. Elevation left hemidiaphragm with base atelectasis and small left pleural effusion. Atelectatic change also present right mid lung. No new opacity evident. Stable cardiac prominence. Electronically Signed   By: Lowella Grip III M.D.   On: 07/03/2018 07:25   Dg Chest Port 1 View  Result Date:  07/02/2018 CLINICAL DATA:  Status post coronary bypass grafting EXAM: PORTABLE CHEST 1 VIEW COMPARISON:  07/01/2018 FINDINGS: Endotracheal tube and nasogastric catheter have been removed in the interval. Swan-Ganz catheter, mediastinal drain and left thoracostomy catheter remain in place. No pneumothorax is seen. Minimal left basilar atelectasis is noted. IMPRESSION: Minimal left basilar atelectasis. Tubes and lines as described. Electronically Signed   By: Inez Catalina M.D.   On: 07/02/2018 07:18   Dg Chest Port 1 View  Result Date: 07/01/2018 CLINICAL DATA:  Status post CABG EXAM: PORTABLE CHEST 1 VIEW COMPARISON:  06/30/2018 FINDINGS: Gordy Councilman catheter with the tip projecting over the right ventricular outflow tract. Endotracheal tube with the tip 3.7 cm above the carina. Nasogastric tube with the tip projecting at the esophagogastric junction; recommend advancing the tube 15 cm. Mediastinal drain is noted. Left-sided chest tube is noted. No pneumothorax. No focal consolidation or pleural effusion. Stable cardiomediastinal silhouette. Interval CABG. No acute osseous abnormality. IMPRESSION: 1. Swan Ganz catheter with the tip projecting over the right ventricular outflow tract. 2. Endotracheal tube with the tip 3.7 cm above the carina. 3. Nasogastric tube with the tip projecting at the esophagogastric junction; recommend advancing the tube 15 cm. Electronically Signed   By: Kathreen Devoid   On: 07/01/2018 15:15    Cardiac Studies   Cath 06/29/18 (emergent)  Mid RCA lesion is 100% stenosed.  Mid LM to Dist LM lesion is 30% stenosed.  Prox LAD lesion is 100% stenosed.  Ost 2nd Mrg lesion is 100% stenosed.  LV end diastolic pressure is moderately elevated.  There is moderate to severe left ventricular systolic dysfunction.   1.  Severe three-vessel coronary artery disease likely with recent occlusion of the RCA 2.  Total occlusion of the mid RCA, proximal LAD, and second obtuse marginal, all  vessels supplied by collateral branches 3.  Moderately severe LV systolic dysfunction with LVEF estimated at 35% with akinesis of the anterolateral wall and basal inferior walls 4.  Diffuse aortoiliac disease without evidence of abdominal aortic aneurysm 5.  Successful placement of an intra-aortic balloon pump via right femoral artery access  Recommendations: Once the patient is stabilized, I think he would likely benefit from surgical revascularization for treatment of his critical multivessel coronary artery disease.  A TCTS consult will be placed.  Echo 06/30/18 Study Conclusions  - Left ventricle: The cavity size was mildly dilated. There was   moderate concentric hypertrophy. Systolic function was mildly to   moderately reduced. The estimated ejection fraction was in the   range of 40% to 45%. Doppler parameters are consistent with   abnormal left ventricular relaxation (grade 1 diastolic   dysfunction). Doppler parameters are consistent with elevated   ventricular end-diastolic filling pressure. - Aortic valve: Trileaflet; mildly thickened, mildly calcified   leaflets. Transvalvular velocity was within the normal range.   There was no stenosis. - Mitral valve: Calcified annulus. Mildly thickened leaflets .   There was moderate regurgitation directed posteriorly. - Left atrium: The atrium was normal in size. - Right ventricle: Systolic function was normal. - Tricuspid valve: There was mild regurgitation. - Pulmonary arteries:  Systolic pressure was within the normal   range. - Inferior vena cava: The vessel was normal in size. - Pericardium, extracardiac: There was no pericardial effusion.  Impressions:  - LVEF 40-45% with akinesis in the basal and mid inferior,   inferolateral walls and in the apical lateral wall.  Patient Profile     72 y.o. male without significant PMH (no doctor in 53 years) but 23 years of tobacco use presented with NSTEMI; initially pain free  despite very elevated troponin, but had an acute episode of decompensation on the evening of 06/29/18 requiring urgent catheterization. 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 Servando Snare on 07/01/18 (LIMA-LAG, SVG-OM1, SVG-Diag, SVG-PL) -ECG with inferior Qs -Risk factors: long history of tobacco use. No diabetes (A1c normal at 5.4). Elevated LDL of 169 -on aspirin 325 mg post CABG, atorvastatin 80 mg -on metoprolol 12.5 mg BID -given NSTEMI, should be on clopidogrel for 12 mos post CABG (with decrease to aspirin 81 mg). Would add prior to discharge. -overall recovering well, weaning support (on dopamine and milrinone). Tolerating PO, no BM yet. -last co-ox 64.8. Weights from 70.6 on admission, peak 76.4, 73.3 kg today  Hypertension: immediate post op period, monitoring -holding anything beyond metoprolol given pressor wean -likely will need ACEi/ARB and spironolactone, will start after surgery when able  Hyperlipidemia: LDL 169, Tchol 250, HDL 69 -started on atorvastatin 80 mg, follow lipids and LFTs at follow up  Tobacco abuse: counseled, plans to quit now, declines smoking cessation aid at this time  Small bilateral PE -suspect this was provoked with travel, so post CABG when appropriate would transition to anticoagulation. Will need 3 months of AC.  Discussed overall stepwise recovery from CABG. He plans to remain in the area. Discussed follow up with CT surgery, cardiology, cardiac rehab, etc. He wants to pursue all in [place].  For questions or updates, please contact [place] Please consult [email or web address] for contact info under   CRITICAL CARE Total critical care time: 25 minutes. This time includes gathering of history, evaluation of patient's response to treatment, examination of patient, review of laboratory and imaging studies, and  coordination with consultants.     Signed, Buford Dresser, MD  07/03/2018, 9:24 AM

## 2018-07-03 NOTE — Discharge Summary (Addendum)
Physician Discharge Summary  Patient ID: Randy [name] MRN: [id number] DOB/AGE: 1946-04-22 72 y.o.  Admit date: 06/29/2018 Discharge date: 07/07/2018  Admission Diagnoses:  Patient Active Problem List   Diagnosis Date Noted  . NSTEMI (non-ST elevated myocardial infarction) ([place]) 06/29/2018   Discharge Diagnoses:   Patient Active Problem List   Diagnosis Date Noted  . S/P CABG x 4 07/01/2018  . NSTEMI (non-ST elevated myocardial infarction) ([place]) 06/29/2018   Discharged Condition: good  History of Present Illness:  Randy [name] is a 72 yo male with history of nicotine abuse.  The patient travels weekly between here and [place].  The patient developed increased shortness of breath and diaphoresis during his stay this weekend.  He presented to the [facility] Emergency Department for evaluation.  CT scan was obtained and was positive for small bilateral  PE.  His troponin levels were elevated.  He was admitted for 6E, but unfortunately developed more shortness of breath and it was felt emergent cardiac catheterization was performed.  This showed multivessel CAD.  An intra-aortic balloon pump was placed.  His symptoms improved post procedure.  It was felt coronary bypass grafting would be indicated and TCTS consult was obtained.  Hospital Course:   He was evaluated by Dr. Servando Snare who was in agreement the patient would benefit from coronary bypass grafting procedure.  The risks and benefits of the procedure were explained to the patient and he was agreeable to proceed.  He remained chest pain free during admission.  He had no further episodes of shortness of breath after catheterization.  He was taken to the operating room on 07/01/2018.  He underwent CABG x 4 utilizing LIMA to LAD, SVG to OM, SVG to Diagonal, and SVG to PLVB.  He also underwent endoscopic harvest of greater saphenous vein from his right leg.  He tolerated the procedure without difficulty and was taken to the SICU in stable condition.   During his stay in the SICU the patient was weaned and extubated on POD #1.  He was weaned off Milrinone and Dopamine as tolerated.  He was also weaned off his balloon pump on POD #1.  His mediastinal chest tube was removed on 07/03/2018.  He developed Atrial Fibrillation and was treated with IV Amiodarone.  He successfully converted to NSR.  His pacing wires were removed with difficulty.  He was started on Xarelto for small bilateral pulmonary emboli.  He was medically stable for transfer to the telemetry unit on 07/05/2018.  He continues to make progress.  He remains in NSR.  He is ambulating independently.  His incisions are healing without evidence of infection.  He is medically stable for discharge home today.      Significant Diagnostic Studies: angiography:    Mid RCA lesion is 100% stenosed.  Mid LM to Dist LM lesion is 30% stenosed.  Prox LAD lesion is 100% stenosed.  Ost 2nd Mrg lesion is 100% stenosed.  LV end diastolic pressure is moderately elevated.  There is moderate to severe left ventricular systolic dysfunction.   1.  Severe three-vessel coronary artery disease likely with recent occlusion of the RCA 2.  Total occlusion of the mid RCA, proximal LAD, and second obtuse marginal, all vessels supplied by collateral branches 3.  Moderately severe LV systolic dysfunction with LVEF estimated at 35% with akinesis of the anterolateral wall and basal inferior walls 4.  Diffuse aortoiliac disease without evidence of abdominal aortic aneurysm 5.  Successful placement of an intra-aortic balloon pump via right  femoral artery access  Treatments: surgery:   Urgent coronary artery bypass grafting with the preoperatively placed intraaortic balloon pump, coronary artery bypass grafting x4 with the left internal mammary to the left anterior descending coronary artery, reverse saphenous vein  graft to the diagonal coronary artery, reverse saphenous vein graft to the obtuse marginal coronary  artery and reverse saphenous vein graft to the posterior lateral branch of the right coronary artery with right thigh and calf endoscopic vein harvesting  of the greater saphenous vein.  Discharge Exam: Blood pressure 130/71, pulse 88, temperature 97.7 F (36.5 C), temperature source Oral, resp. rate (!) 26, height 5\' 3"  (1.6 m), weight 71.2 kg, SpO2 97 %.  General appearance: alert, cooperative and no distress Heart: regular rate and rhythm Lungs: clear to auscultation bilaterally Abdomen: soft, non-tender; bowel sounds normal; no masses,  no organomegaly Extremities: edema trace Wound: clean and dry   Discharge disposition: 01-Home or Self Care   Discharge Medications:  The patient has been discharged on:   1.Beta Blocker:  Yes [  X ]                              No   [   ]                              If No, reason:  2.Ace Inhibitor/ARB: Yes [ X  ]                                     No  [    ]                                     If No, reason:  3.Statin:   Yes Valu.Nieves   ]                  No  [   ]                  If No, reason:  4.Ecasa:  Yes  [ X  ]                  No   [   ]                  If No, reason:    Discharge Instructions    Amb Referral to Cardiac Rehabilitation   Complete by:  As directed    Diagnosis:   CABG NSTEMI     CABG X ___:  4     Allergies as of 07/07/2018   No Known Allergies     Medication List    TAKE these medications   amiodarone 200 MG tablet Commonly known as:  PACERONE Take 1 tablet (200 mg total) by mouth 2 (two) times daily. X 7 days, then decrease to 200 mg daily   aspirin 81 MG EC tablet Take 1 tablet (81 mg total) by mouth daily.   atorvastatin 80 MG tablet Commonly known as:  LIPITOR Take 1 tablet (80 mg total) by mouth daily at 6 PM.   furosemide 40 MG tablet Commonly known as:  LASIX Take 1 tablet (40 mg total) by mouth daily. X  3 days, then take on a daily basis as needed for weight gain of 3 lbs    losartan 100 MG tablet Commonly known as:  COZAAR Take 1 tablet (100 mg total) by mouth daily.   metoprolol succinate 50 MG 24 hr tablet Commonly known as:  TOPROL-XL Take 1 tablet (50 mg total) by mouth daily. Take with or immediately following a meal.   potassium chloride SA 20 MEQ tablet Commonly known as:  K-DUR,KLOR-CON Take 1 tablet (20 mEq total) by mouth daily. X 3 days, then only on days you take Lasix   Rivaroxaban 15 & 20 MG Tbpk Take as directed on package: Start with one 15mg  tablet by mouth twice a day with food. On Day 22, switch to one 20mg  tablet once a day with food.   traMADol 50 MG tablet Commonly known as:  ULTRAM Take 1 tablet (50 mg total) by mouth every 6 (six) hours as needed for moderate pain.      Follow-up Information    Grace Isaac, MD Follow up on 07/30/2018.   Specialty:  Cardiothoracic Surgery Why:  Appointment is at 4:00, please get CXR at 3:30 at [facility] located on first floor of our office building Contact information: [place] [address] [phone]        Rylan Bernard, Evelene Croon, PA-C Follow up on 07/20/2018.   Specialties:  Cardiology, Radiology Why:  Appointment is at 10:30 Contact information: [address] [place] [address] [phone]           Signed: Ellwood Handler 07/07/2018, 9:00 AM

## 2018-07-03 NOTE — Progress Notes (Signed)
EVENING ROUNDS NOTE :     [address]       [place] [address]             [phone]                 2 Days Post-Op Procedure(s) (LRB): CORONARY ARTERY BYPASS GRAFTING (CABG) x 4, LIMA TO LAD, SVG TO OM1, SVG TO DIAG, SVG TO PL, USING LEFT INTERNAL MAMMARY ARTERY AND RIGHT GREATER SAPHENOUS VEIN HARVESTED ENDOSCOPICALLY (N/A) TRANSESOPHAGEAL ECHOCARDIOGRAM (TEE) (N/A)  Total Length of Stay:  LOS: 4 days  BP (!) 136/91   Pulse (!) 101   Temp 98.2 F (36.8 C) (Oral)   Resp (!) 21   Ht 5\' 3"  (1.6 m)   Wt 73.3 kg   SpO2 100%   BMI 28.63 kg/m   .Intake/Output      12/19 0701 - 12/20 0700 12/20 0701 - 12/21 0700   P.O. 1020 240   I.V. (mL/kg) 759.1 (10.4) 107.3 (1.5)   Blood     NG/GT     IV Piggyback 180.6 49.1   Total Intake(mL/kg) 1959.7 (26.7) 396.3 (5.4)   Urine (mL/kg/hr) 2230 (1.3) 1050 (1.2)   Emesis/NG output     Blood     Chest Tube 220 80   Total Output 2450 1130   Net -490.3 -733.7          . sodium chloride Stopped (07/02/18 1812)  . sodium chloride 250 mL (07/02/18 0900)  . sodium chloride Stopped (07/02/18 1754)  . amiodarone     Followed by  . [START ON 07/04/2018] amiodarone    . amiodarone     Followed by  . [START ON 07/04/2018] amiodarone    . dexmedetomidine (PRECEDEX) IV infusion Stopped (07/01/18 2153)  . DOPamine 1.25 mcg/kg/min (07/03/18 1807)  . lactated ringers    . lactated ringers    . lactated ringers Stopped (07/03/18 0549)  . milrinone 0.3 mcg/kg/min (07/03/18 1807)  . nitroGLYCERIN    . phenylephrine (NEO-SYNEPHRINE) Adult infusion       Lab Results  Component Value Date   WBC 11.8 (H) 07/03/2018   HGB 10.5 (L) 07/03/2018   HCT 31.0 (L) 07/03/2018   PLT 113 (L) 07/03/2018   GLUCOSE 131 (H) 07/03/2018   CHOL 250 (H) 06/29/2018   TRIG 62 06/29/2018   HDL 69 06/29/2018   LDLCALC 169 (H) 06/29/2018   ALT 27 06/30/2018   AST 158 (H) 06/30/2018   NA 135 07/03/2018   K 3.6 07/03/2018   CL 99  07/03/2018   CREATININE 0.90 07/03/2018   BUN 18 07/03/2018   CO2 26 07/03/2018   INR 1.42 07/01/2018   HGBA1C 5.4 06/29/2018   developed rapid afib , started on iv Tomasita Crumble MD  [address] [phone] Office [phone] 07/03/2018 7:00 PM

## 2018-07-04 ENCOUNTER — Inpatient Hospital Stay (HOSPITAL_COMMUNITY): Payer: Medicare PPO

## 2018-07-04 LAB — BASIC METABOLIC PANEL
Anion gap: 8 (ref 5–15)
BUN: 20 mg/dL (ref 8–23)
CO2: 28 mmol/L (ref 22–32)
Calcium: 7.7 mg/dL — ABNORMAL LOW (ref 8.9–10.3)
Chloride: 100 mmol/L (ref 98–111)
Creatinine, Ser: 1.09 mg/dL (ref 0.61–1.24)
GFR calc Af Amer: >60 mL/min (ref >60)
GFR calc non Af Amer: >60 mL/min (ref >60)
Glucose, Bld: 109 mg/dL — ABNORMAL HIGH (ref 70–99)
Potassium: 3.5 mmol/L (ref 3.5–5.1)
Sodium: 136 mmol/L (ref 135–145)

## 2018-07-04 LAB — GLUCOSE, CAPILLARY
Glucose-Capillary: 96 mg/dL (ref 70–99)
Glucose-Capillary: 90 mg/dL (ref 70–99)
Glucose-Capillary: 93 mg/dL (ref 70–99)
Glucose-Capillary: 107 mg/dL — ABNORMAL HIGH (ref 70–99)
Glucose-Capillary: 102 mg/dL — ABNORMAL HIGH (ref 70–99)
Glucose-Capillary: 97 mg/dL (ref 70–99)

## 2018-07-04 LAB — CBC
HCT: 31.3 % — ABNORMAL LOW (ref 39.0–52.0)
Hemoglobin: 10.3 g/dL — ABNORMAL LOW (ref 13.0–17.0)
MCH: 30.7 pg (ref 26.0–34.0)
MCHC: 32.9 g/dL (ref 30.0–36.0)
MCV: 93.4 fL (ref 80.0–100.0)
Platelets: 128 10*3/uL — ABNORMAL LOW (ref 150–400)
RBC: 3.35 MIL/uL — ABNORMAL LOW (ref 4.22–5.81)
RDW: 13.7 % (ref 11.5–15.5)
WBC: 9.9 10*3/uL (ref 4.0–10.5)
nRBC: 0 % (ref 0.0–0.2)

## 2018-07-04 LAB — COOXEMETRY PANEL
Carboxyhemoglobin: 1.1 % (ref 0.5–1.5)
Carboxyhemoglobin: 1.1 % (ref 0.5–1.5)
Methemoglobin: 1.8 % — ABNORMAL HIGH (ref 0.0–1.5)
Methemoglobin: 1.6 % — ABNORMAL HIGH (ref 0.0–1.5)
O2 Saturation: 57.7 %
O2 Saturation: 57 %
Total hemoglobin: 10.5 g/dL — ABNORMAL LOW (ref 12.0–16.0)
Total hemoglobin: 11.6 g/dL — ABNORMAL LOW (ref 12.0–16.0)

## 2018-07-04 LAB — MAGNESIUM: Magnesium: 2.1 mg/dL (ref 1.7–2.4)

## 2018-07-04 LAB — TSH: TSH: 7.113 u[IU]/mL — ABNORMAL HIGH (ref 0.350–4.500)

## 2018-07-04 MED ORDER — LEVALBUTEROL HCL 0.63 MG/3ML IN NEBU
0.6300 mg | INHALATION_SOLUTION | Freq: Three times a day (TID) | RESPIRATORY_TRACT | Status: DC
  Administered 2018-07-04: 0.63 mg via RESPIRATORY_TRACT
  Filled 2018-07-04 (×2): qty 3

## 2018-07-04 MED ORDER — ASPIRIN EC 81 MG PO TBEC
81.0000 mg | DELAYED_RELEASE_TABLET | Freq: Every day | ORAL | Status: DC
  Administered 2018-07-04: 81 mg via ORAL
  Filled 2018-07-04: qty 1

## 2018-07-04 MED ORDER — RIVAROXABAN (XARELTO) EDUCATION KIT FOR DVT/PE PATIENTS
PACK | Freq: Once | Status: AC
  Administered 2018-07-04: 09:00:00
  Filled 2018-07-04: qty 1

## 2018-07-04 MED ORDER — RIVAROXABAN 10 MG PO TABS
10.0000 mg | ORAL_TABLET | Freq: Two times a day (BID) | ORAL | Status: DC
  Administered 2018-07-04 – 2018-07-07 (×6): 10 mg via ORAL
  Filled 2018-07-04 (×8): qty 1

## 2018-07-04 MED ORDER — POTASSIUM CHLORIDE CRYS ER 20 MEQ PO TBCR
20.0000 meq | EXTENDED_RELEASE_TABLET | Freq: Every day | ORAL | Status: DC
  Administered 2018-07-05 – 2018-07-07 (×3): 20 meq via ORAL
  Filled 2018-07-04 (×4): qty 1

## 2018-07-04 MED ORDER — FUROSEMIDE 40 MG PO TABS
40.0000 mg | ORAL_TABLET | Freq: Every day | ORAL | Status: DC
  Administered 2018-07-04 – 2018-07-07 (×4): 40 mg via ORAL
  Filled 2018-07-04 (×4): qty 1

## 2018-07-04 MED ORDER — ASPIRIN 81 MG PO CHEW: 81.0000 mg | CHEWABLE_TABLET | Freq: Every day | ORAL | Status: DC

## 2018-07-04 MED ORDER — ASPIRIN 81 MG PO CHEW: 324.0000 mg | CHEWABLE_TABLET | Freq: Every day | ORAL | Status: DC

## 2018-07-04 MED ORDER — ASPIRIN EC 81 MG PO TBEC: 81.0000 mg | DELAYED_RELEASE_TABLET | Freq: Every day | ORAL | Status: DC

## 2018-07-04 NOTE — Plan of Care (Signed)
  Problem: Education: Goal: Knowledge of General Education information will improve Description Including pain rating scale, medication(s)/side effects and non-pharmacologic comfort measures Outcome: Completed/Met   Problem: Health Behavior/Discharge Planning: Goal: Ability to manage health-related needs will improve Outcome: Completed/Met   Problem: Education: Goal: Understanding of CV disease, CV risk reduction, and recovery process will improve Outcome: Completed/Met Goal: Individualized Educational Video(s) Outcome: Completed/Met   Problem: Activity: Goal: Ability to return to baseline activity level will improve Outcome: Completed/Met   Problem: Cardiovascular: Goal: Ability to achieve and maintain adequate cardiovascular perfusion will improve Outcome: Completed/Met Goal: Vascular access site(s) Level 0-1 will be maintained Outcome: Completed/Met   Problem: Health Behavior/Discharge Planning: Goal: Ability to safely manage health-related needs after discharge will improve Outcome: Completed/Met   Problem: Education: Goal: Understanding of cardiac disease, CV risk reduction, and recovery process will improve Outcome: Completed/Met Goal: Understanding of medication regimen will improve Outcome: Completed/Met Goal: Individualized Educational Video(s) Outcome: Completed/Met   Problem: Activity: Goal: Ability to tolerate increased activity will improve Outcome: Completed/Met   Problem: Cardiac: Goal: Ability to achieve and maintain adequate cardiopulmonary perfusion will improve Outcome: Completed/Met Goal: Vascular access site(s) Level 0-1 will be maintained Outcome: Completed/Met   Problem: Health Behavior/Discharge Planning: Goal: Ability to safely manage health-related needs after discharge will improve Outcome: Completed/Met   Problem: Education: Goal: Will demonstrate proper wound care and an understanding of methods to prevent future damage Outcome:  Completed/Met Goal: Knowledge of disease or condition will improve Outcome: Completed/Met Goal: Knowledge of the prescribed therapeutic regimen will improve Outcome: Completed/Met Goal: Individualized Educational Video(s) Outcome: Completed/Met   Problem: Activity: Goal: Risk for activity intolerance will decrease Outcome: Completed/Met   Problem: Cardiac: Goal: Will achieve and/or maintain hemodynamic stability Outcome: Completed/Met   Problem: Clinical Measurements: Goal: Postoperative complications will be avoided or minimized Outcome: Completed/Met   Problem: Respiratory: Goal: Respiratory status will improve Outcome: Completed/Met   Problem: Skin Integrity: Goal: Wound healing without signs and symptoms of infection Outcome: Completed/Met Goal: Risk for impaired skin integrity will decrease Outcome: Completed/Met   Problem: Urinary Elimination: Goal: Ability to achieve and maintain adequate renal perfusion and functioning will improve Outcome: Completed/Met

## 2018-07-04 NOTE — Progress Notes (Signed)
Progress Note  Patient Name: Randy [name] Date of Encounter: 07/04/2018  Primary Cardiologist: Dr Harrell Gave  Subjective   No CP or dyspnea  Inpatient Medications    Scheduled Meds: . acetaminophen  1,000 mg Oral Q6H   Or  . acetaminophen (TYLENOL) oral liquid 160 mg/5 mL  1,000 mg Per Tube Q6H  . amiodarone  150 mg Intravenous Once  . amiodarone  400 mg Oral Q12H   Followed by  . [START ON 07/11/2018] amiodarone  400 mg Oral Daily  . aspirin EC  325 mg Oral Daily   Or  . aspirin  324 mg Per Tube Daily  . atorvastatin  80 mg Oral q1800  . bisacodyl  10 mg Oral Daily   Or  . bisacodyl  10 mg Rectal Daily  . Chlorhexidine Gluconate Cloth  6 each Topical Daily  . docusate sodium  200 mg Oral Daily  . enoxaparin (LOVENOX) injection  40 mg Subcutaneous QHS  . furosemide  40 mg Intravenous Once  . Influenza vac split quadrivalent PF  0.5 mL Intramuscular Tomorrow-1000  . insulin aspart  0-24 Units Subcutaneous Q4H  . levalbuterol  0.63 mg Nebulization QID  . mouth rinse  15 mL Mouth Rinse BID  . metoprolol tartrate  25 mg Oral BID   Or  . metoprolol tartrate  25 mg Per Tube BID  . pantoprazole  40 mg Oral Daily  . sodium chloride flush  10-40 mL Intracatheter Q12H  . sodium chloride flush  3 mL Intravenous Q12H   Continuous Infusions: . sodium chloride Stopped (07/02/18 1812)  . sodium chloride 250 mL (07/02/18 0900)  . sodium chloride Stopped (07/02/18 1754)  . amiodarone 30 mg/hr (07/04/18 0116)  . dexmedetomidine (PRECEDEX) IV infusion Stopped (07/01/18 2153)  . DOPamine 1.25 mcg/kg/min (07/03/18 1807)  . lactated ringers    . lactated ringers    . lactated ringers Stopped (07/03/18 0549)  . milrinone 0.3 mcg/kg/min (07/03/18 2126)  . nitroGLYCERIN    . phenylephrine (NEO-SYNEPHRINE) Adult infusion     PRN Meds: sodium chloride, lactated ringers, levalbuterol, metoprolol tartrate, midazolam, morphine injection, ondansetron (ZOFRAN) IV, oxyCODONE, potassium  chloride, sodium chloride flush, sodium chloride flush, traMADol   Vital Signs    Vitals:   07/04/18 0400 07/04/18 0500 07/04/18 0600 07/04/18 0700  BP: 99/67 112/63 127/67 (!) 146/79  Pulse: (!) 59 60 63 69  Resp: 10 11 16 20   Temp: 98.1 F (36.7 C)     TempSrc: Oral     SpO2: 100% 100% 100% 100%  Weight:      Height:        Intake/Output Summary (Last 24 hours) at 07/04/2018 0722 Last data filed at 07/04/2018 0600 Gross per 24 hour  Intake 870.89 ml  Output 2085 ml  Net -1214.11 ml   Filed Weights   06/30/18 0930 07/02/18 0600 07/03/18 0600  Weight: 70.6 kg 76.4 kg 73.3 kg    Telemetry    PAF converting to sinus - Personally Reviewed   Physical Exam   GEN: No acute distress.   Neck: No JVD Cardiac: RRR, no murmurs, rubs, or gallops.  Respiratory: Clear to auscultation bilaterally. GI: Soft, nontender, non-distended  MS: No edema Neuro:  Nonfocal  Psych: Normal affect   Labs    Chemistry Recent Labs  Lab 06/29/18 1035 06/30/18 0318 07/01/18 0434  07/02/18 0414 07/02/18 1626  07/03/18 0425 07/03/18 1322 07/03/18 2022  NA 139 141 136   < > 137  --    < >  138 135 135  K 4.2 4.1 3.7   < > 4.2  --    < > 3.6 3.6 3.8  CL 106 110 104   < > 107  --    < > 103 99 98  CO2 22 17* 23  --  21*  --   --  26  --   --   GLUCOSE 116* 92 112*   < > 111*  --    < > 113* 131* 127*  BUN 16 16 20    < > 15  --    < > 18 18 19   CREATININE 1.18 1.01 0.97   < > 1.02 1.06   < > 1.06 0.90 0.90  CALCIUM 8.8* 8.7* 8.6*  --  7.4*  --   --  7.9*  --   --   PROT 6.8 5.9*  --   --   --   --   --   --   --   --   ALBUMIN 4.1 3.5  --   --   --   --   --   --   --   --   AST 85* 158*  --   --   --   --   --   --   --   --   ALT 20 27  --   --   --   --   --   --   --   --   ALKPHOS 38 32*  --   --   --   --   --   --   --   --   BILITOT 1.1 1.5*  --   --   --   --   --   --   --   --   GFRNONAA >60 >60 >60   < > >60 >60  --  >60  --   --   GFRAA >60 >60 >60   < > >60 >60  --  >60   --   --   ANIONGAP 11 14 9   --  9  --   --  9  --   --    < > = values in this interval not displayed.     Hematology Recent Labs  Lab 07/02/18 0414 07/02/18 1626  07/03/18 0425 07/03/18 1322 07/03/18 2022  WBC 11.7* 13.8*  --  11.8*  --   --   RBC 3.56* 3.66*  --  3.50*  --   --   HGB 10.9* 11.4*   < > 10.9* 10.5* 11.2*  HCT 33.3* 34.0*   < > 32.9* 31.0* 33.0*  MCV 93.5 92.9  --  94.0  --   --   MCH 30.6 31.1  --  31.1  --   --   MCHC 32.7 33.5  --  33.1  --   --   RDW 13.7 13.9  --  13.8  --   --   PLT 122* 128*  --  113*  --   --    < > = values in this interval not displayed.    Cardiac Enzymes Recent Labs  Lab 06/29/18 1344 06/29/18 2010 06/30/18 0318  TROPONINI 30.55* 46.33* 42.02*    Recent Labs  Lab 06/29/18 1040 06/29/18 1348  TROPIPOC 6.71* 24.18*     BNP Recent Labs  Lab 06/29/18 1344 06/30/18 0318  BNP 665.4* 1,369.9*     DDimer  Recent Labs  Lab 06/29/18 1035  DDIMER 0.93*     Radiology    Dg Chest Port 1 View  Result Date: 07/03/2018 CLINICAL DATA:  Status post coronary artery bypass grafting. Chest tube present. EXAM: PORTABLE CHEST 1 VIEW COMPARISON:  July 02, 2018 FINDINGS: The Swan-Ganz catheter is been removed. Cordis tip remains in the superior vena cava. There is a chest tube on the left. There is a mediastinal drain. Temporary pacemaker wires are attached to the right heart. No pneumothorax evident. There is elevation of the left hemidiaphragm with a small left pleural effusion. There is atelectatic change in the left base and right mid lung regions. There is no frank consolidation. Heart is mildly enlarged with pulmonary vascularity normal. No evident adenopathy. No bone lesions. IMPRESSION: Tube and catheter positions as described without pneumothorax. Elevation left hemidiaphragm with base atelectasis and small left pleural effusion. Atelectatic change also present right mid lung. No new opacity evident. Stable cardiac  prominence. Electronically Signed   By: Lowella Grip III M.D.   On: 07/03/2018 07:25   Patient Profile     72 y.o. male without significant PMH (no doctor in 20 years) but 59 years of tobacco use presented with NSTEMI; initially pain free despite very elevated troponin, but had an acute episode of decompensation on the evening of 06/29/18 requiring urgent catheterization. 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  Assessment & Plan    1 coronary artery disease-status post coronary artery bypass graft.  Patient doing well.  Continue aspirin and statin.  2 ischemic cardiomyopathy-would continue metoprolol.  Convert to Toprol prior to discharge.  Add ACE inhibitor or ARB prior to discharge.  3 recent pulmonary embolus-we will need to initiate apixaban or Xarelto when okay with surgery.  This was felt to be provoked by recent travel.  Treat with 3 months of anticoagulation and then discontinue.  4 hypertension-follow blood pressure and adjust regimen as needed.  Add ACE inhibitor or ARB prior to discharge.  5 postoperative atrial fibrillation-patient is back in sinus rhythm today.  Convert amiodarone to oral.  At discharge we will treat with 200 mg daily.  Continue 8 weeks and if he hold sinus rhythm discontinue at that time.  6 hyperlipidemia-continue statin.  Check lipids and liver 4 to 6 weeks following discharge.  For questions or updates, please contact [place] Please consult [email or web address] for contact info under        Signed, Kirk Ruths, MD  07/04/2018, 7:22 AM

## 2018-07-04 NOTE — Progress Notes (Signed)
[facility] DAILY ICU PROGRESS NOTE                   [address]            [place] [address]          [phone]   3 Days Post-Op Procedure(s) (LRB): CORONARY ARTERY BYPASS GRAFTING (CABG) x 4, LIMA TO LAD, SVG TO OM1, SVG TO DIAG, SVG TO PL, USING LEFT INTERNAL MAMMARY ARTERY AND RIGHT GREATER SAPHENOUS VEIN HARVESTED ENDOSCOPICALLY (N/A) TRANSESOPHAGEAL ECHOCARDIOGRAM (TEE) (N/A)  Total Length of Stay:  LOS: 5 days   Subjective: Patient feels better this morning, brief episode of atrial fib yesterday started on IV amnio to convert to p.o. today back in sinus  Objective: Vital signs in last 24 hours: Temp:  [97.5 F (36.4 C)-98.3 F (36.8 C)] 98.1 F (36.7 C) (12/21 0400) Pulse Rate:  [36-108] 81 (12/21 0800) Cardiac Rhythm: Normal sinus rhythm (12/21 0732) Resp:  [10-32] 22 (12/21 0800) BP: (81-149)/(59-121) 149/86 (12/21 0800) SpO2:  [87 %-100 %] 100 % (12/21 0800)  Filed Weights   06/30/18 0930 07/02/18 0600 07/03/18 0600  Weight: 70.6 kg 76.4 kg 73.3 kg    Weight change:    Hemodynamic parameters for last 24 hours:    Intake/Output from previous day: 12/20 0701 - 12/21 0700 In: 870.9 [P.O.:240; I.V.:581.8; IV Piggyback:49.1] Out: 2185 [Urine:2105; Chest Tube:80]  Intake/Output this shift: Total I/O In: 44.8 [I.V.:44.8] Out: -   Current Meds: Scheduled Meds: . acetaminophen  1,000 mg Oral Q6H   Or  . acetaminophen (TYLENOL) oral liquid 160 mg/5 mL  1,000 mg Per Tube Q6H  . amiodarone  150 mg Intravenous Once  . amiodarone  400 mg Oral Q12H   Followed by  . [START ON 07/11/2018] amiodarone  400 mg Oral Daily  . aspirin EC  325 mg Oral Daily   Or  . aspirin  324 mg Per Tube Daily  . atorvastatin  80 mg Oral q1800  . bisacodyl  10 mg Oral Daily   Or  . bisacodyl  10 mg Rectal Daily  . Chlorhexidine Gluconate Cloth  6 each Topical Daily  . docusate sodium  200 mg Oral Daily  . enoxaparin (LOVENOX) injection  40 mg Subcutaneous QHS  .  furosemide  40 mg Intravenous Once  . Influenza vac split quadrivalent PF  0.5 mL Intramuscular Tomorrow-1000  . insulin aspart  0-24 Units Subcutaneous Q4H  . levalbuterol  0.63 mg Nebulization QID  . metoprolol tartrate  25 mg Oral BID   Or  . metoprolol tartrate  25 mg Per Tube BID  . pantoprazole  40 mg Oral Daily  . sodium chloride flush  10-40 mL Intracatheter Q12H  . sodium chloride flush  3 mL Intravenous Q12H   Continuous Infusions: . sodium chloride Stopped (07/02/18 1812)  . sodium chloride 250 mL (07/02/18 0900)  . sodium chloride Stopped (07/02/18 1754)  . amiodarone 30 mg/hr (07/04/18 0800)  . dexmedetomidine (PRECEDEX) IV infusion Stopped (07/01/18 2153)  . DOPamine 1.25 mcg/kg/min (07/03/18 1807)  . lactated ringers    . lactated ringers    . lactated ringers Stopped (07/03/18 0549)  . milrinone 0.3 mcg/kg/min (07/04/18 0800)  . nitroGLYCERIN    . phenylephrine (NEO-SYNEPHRINE) Adult infusion     PRN Meds:.sodium chloride, lactated ringers, levalbuterol, metoprolol tartrate, midazolam, morphine injection, ondansetron (ZOFRAN) IV, oxyCODONE, potassium chloride, sodium chloride flush, sodium chloride flush, traMADol  General appearance: alert, cooperative and no  distress Neurologic: intact Heart: regular rate and rhythm, S1, S2 normal, no murmur, click, rub or gallop Lungs: diminished breath sounds bibasilar Abdomen: soft, non-tender; bowel sounds normal; no masses,  no organomegaly Extremities: extremities normal, atraumatic, no cyanosis or edema and Homans sign is negative, no sign of DVT Wound: Sternum stable dressing intact  Lab Results: CBC: Recent Labs    07/03/18 0425  07/03/18 2022 07/04/18 0624  WBC 11.8*  --   --  9.9  HGB 10.9*   < > 11.2* 10.3*  HCT 32.9*   < > 33.0* 31.3*  PLT 113*  --   --  128*   < > = values in this interval not displayed.   BMET:  Recent Labs    07/02/18 0414  07/03/18 0425 07/03/18 1322 07/03/18 2022  NA 137   < >  138 135 135  K 4.2   < > 3.6 3.6 3.8  CL 107   < > 103 99 98  CO2 21*  --  26  --   --   GLUCOSE 111*   < > 113* 131* 127*  BUN 15   < > 18 18 19   CREATININE 1.02   < > 1.06 0.90 0.90  CALCIUM 7.4*  --  7.9*  --   --    < > = values in this interval not displayed.    CMET: Lab Results  Component Value Date   WBC 9.9 07/04/2018   HGB 10.3 (L) 07/04/2018   HCT 31.3 (L) 07/04/2018   PLT 128 (L) 07/04/2018   GLUCOSE 127 (H) 07/03/2018   CHOL 250 (H) 06/29/2018   TRIG 62 06/29/2018   HDL 69 06/29/2018   LDLCALC 169 (H) 06/29/2018   ALT 27 06/30/2018   AST 158 (H) 06/30/2018   NA 135 07/03/2018   K 3.8 07/03/2018   CL 98 07/03/2018   CREATININE 0.90 07/03/2018   BUN 19 07/03/2018   CO2 26 07/03/2018   INR 1.42 07/01/2018   HGBA1C 5.4 06/29/2018      PT/INR:  Recent Labs    07/01/18 1503  LABPROT 17.2*  INR 1.42   Radiology: Dg Chest Port 1 View  Result Date: 07/04/2018 CLINICAL DATA:  Postop from CABG.  Chest tube removal. EXAM: PORTABLE CHEST 1 VIEW COMPARISON:  07/03/2018 FINDINGS: Left chest tube has been removed since previous study. No pneumothorax visualized. Minimal residual left pleural fluid or thickening noted. Mild atelectasis seen in left lung base, also improved. Right lung is clear. Stable cardiomegaly. Right jugular central venous catheter remains in appropriate position. IMPRESSION: No pneumothorax visualized following left chest tube removal. Decreased left pleural effusion and left basilar atelectasis. Electronically Signed   By: Earle Gell M.D.   On: 07/04/2018 07:37     Assessment/Plan: S/P Procedure(s) (LRB): CORONARY ARTERY BYPASS GRAFTING (CABG) x 4, LIMA TO LAD, SVG TO OM1, SVG TO DIAG, SVG TO PL, USING LEFT INTERNAL MAMMARY ARTERY AND RIGHT GREATER SAPHENOUS VEIN HARVESTED ENDOSCOPICALLY (N/A) TRANSESOPHAGEAL ECHOCARDIOGRAM (TEE) (N/A) Mobilize Diuresis Now off dopamine,  Weaning   Milrinone, convert to p.o. amnio DC pacing wires today  start Xarelto tonight for  question of PE preop DC central   Randy [name] 07/04/2018 8:13 AM

## 2018-07-04 NOTE — Progress Notes (Signed)
EVENING ROUNDS NOTE :     [address]       [place] [address]             [phone]                 3 Days Post-Op Procedure(s) (LRB): CORONARY ARTERY BYPASS GRAFTING (CABG) x 4, LIMA TO LAD, SVG TO OM1, SVG TO DIAG, SVG TO PL, USING LEFT INTERNAL MAMMARY ARTERY AND RIGHT GREATER SAPHENOUS VEIN HARVESTED ENDOSCOPICALLY (N/A) TRANSESOPHAGEAL ECHOCARDIOGRAM (TEE) (N/A)  Total Length of Stay:  LOS: 5 days  BP 140/88   Pulse 88   Temp 98.2 F (36.8 C) (Oral)   Resp 18   Ht 5\' 3"  (1.6 m)   Wt 73.3 kg   SpO2 (!) 81%   BMI 28.63 kg/m   .Intake/Output      12/20 0701 - 12/21 0700 12/21 0701 - 12/22 0700   P.O. 240 600   I.V. (mL/kg) 581.8 (7.9) 179.5 (2.4)   IV Piggyback 49.1    Total Intake(mL/kg) 870.9 (11.9) 779.5 (10.6)   Urine (mL/kg/hr) 2105 (1.2) 400 (0.5)   Chest Tube 80    Total Output 2185 400   Net -1314.1 +379.5          . sodium chloride Stopped (07/02/18 1812)  . sodium chloride 250 mL (07/02/18 0900)  . sodium chloride Stopped (07/02/18 1754)  . dexmedetomidine (PRECEDEX) IV infusion Stopped (07/01/18 2153)  . lactated ringers    . lactated ringers    . lactated ringers Stopped (07/03/18 0549)  . milrinone 0.125 mcg/kg/min (07/04/18 1713)  . phenylephrine (NEO-SYNEPHRINE) Adult infusion       Lab Results  Component Value Date   WBC 9.9 07/04/2018   HGB 10.3 (L) 07/04/2018   HCT 31.3 (L) 07/04/2018   PLT 128 (L) 07/04/2018   GLUCOSE 109 (H) 07/04/2018   CHOL 250 (H) 06/29/2018   TRIG 62 06/29/2018   HDL 69 06/29/2018   LDLCALC 169 (H) 06/29/2018   ALT 27 06/30/2018   AST 158 (H) 06/30/2018   NA 136 07/04/2018   K 3.5 07/04/2018   CL 100 07/04/2018   CREATININE 1.09 07/04/2018   BUN 20 07/04/2018   CO2 28 07/04/2018   TSH 7.113 (H) 07/04/2018   INR 1.42 07/01/2018   HGBA1C 5.4 06/29/2018   Stable day To start xarelto  Renal function stable   Grace Isaac MD  [facility] [phone] Office [phone] 07/04/2018 5:16  PM

## 2018-07-04 NOTE — Discharge Instructions (Signed)
Discharge Instructions:  1. You may shower, please wash incisions daily with soap and water and keep dry.  If you wish to cover wounds with dressing you may do so but please keep clean and change daily.  No tub baths or swimming until incisions have completely healed.  If your incisions become red or develop any drainage please call our office at [phone]  2. No Driving until cleared by Dr. Everrett Coombe office and you are no longer using narcotic pain medications  3. Monitor your weight daily.. Please use the same scale and weigh at same time... If you gain 5-10 lbs in 48 hours with associated lower extremity swelling, please contact our office at [phone]  4. Fever of 101.5 for at least 24 hours with no source, please contact our office at [phone]   Information on my medicine - XARELTO (rivaroxaban)  [place]? Xarelto was prescribed to treat blood clots that may have been found in the veins of your legs (deep vein thrombosis) or in your lungs (pulmonary embolism) and to reduce the risk of them occurring again.  What do you need to know about Xarelto? Take one tablet TWICE daily with food for the FIRST 21 DAYS then on (enter date)  07/25/18  the dose is changed to one  tablet taken [place] with your evening meal.  DO NOT stop taking Xarelto without talking to the health care provider who prescribed the medication.  Refill your prescription for 20 mg tablets before you run out.  After discharge, you should have regular check-up appointments with your healthcare provider that is prescribing your Xarelto.  In the future your dose may need to be changed if your kidney function changes by a significant amount.  What do you do if you miss a dose? If you are taking Xarelto TWICE DAILY and you miss a dose, take it as soon as you remember. You may take two 15 mg tablets (total 30 mg) at the same time then resume your regularly scheduled 15 mg twice daily  the next day.  If you are taking Xarelto ONCE DAILY and you miss a dose, take it as soon as you remember on the same day then continue your regularly scheduled once daily regimen the next day. Do not take two doses of Xarelto at the same time.   Important Safety Information Xarelto is a blood thinner medicine that can cause bleeding. You should call your healthcare provider right away if you experience any of the following: ? Bleeding from an injury or your nose that does not stop. ? Unusual colored urine (red or dark brown) or unusual colored stools (red or black). ? Unusual bruising for unknown reasons. ? A serious fall or if you hit your head (even if there is no bleeding).  Some medicines may interact with Xarelto and might increase your risk of bleeding while on Xarelto. To help avoid this, consult your healthcare provider or pharmacist prior to using any new prescription or non-prescription medications, including herbals, vitamins, non-steroidal anti-inflammatory drugs (NSAIDs) and supplements.  This website has more information on Xarelto: [email or web address]  5. Activity- up as tolerated, please walk at least 3 times per day.  Avoid strenuous activity, no lifting, pushing, or pulling with your arms over 8-10 lbs for a minimum of 6 weeks  6. If any questions or concerns arise, please do not hesitate to contact our office at [phone]

## 2018-07-05 ENCOUNTER — Inpatient Hospital Stay (HOSPITAL_COMMUNITY): Payer: Medicare PPO

## 2018-07-05 LAB — GLUCOSE, CAPILLARY
Glucose-Capillary: 92 mg/dL (ref 70–99)
Glucose-Capillary: 102 mg/dL — ABNORMAL HIGH (ref 70–99)
Glucose-Capillary: 106 mg/dL — ABNORMAL HIGH (ref 70–99)
Glucose-Capillary: 108 mg/dL — ABNORMAL HIGH (ref 70–99)
Glucose-Capillary: 123 mg/dL — ABNORMAL HIGH (ref 70–99)

## 2018-07-05 LAB — CBC
HCT: 32.7 % — ABNORMAL LOW (ref 39.0–52.0)
Hemoglobin: 11.1 g/dL — ABNORMAL LOW (ref 13.0–17.0)
MCH: 30.9 pg (ref 26.0–34.0)
MCHC: 33.9 g/dL (ref 30.0–36.0)
MCV: 91.1 fL (ref 80.0–100.0)
Platelets: 148 10*3/uL — ABNORMAL LOW (ref 150–400)
RBC: 3.59 MIL/uL — ABNORMAL LOW (ref 4.22–5.81)
RDW: 13.6 % (ref 11.5–15.5)
WBC: 9.4 10*3/uL (ref 4.0–10.5)
nRBC: 0 % (ref 0.0–0.2)

## 2018-07-05 LAB — BASIC METABOLIC PANEL
Anion gap: 12 (ref 5–15)
BUN: 19 mg/dL (ref 8–23)
CO2: 25 mmol/L (ref 22–32)
Calcium: 8.1 mg/dL — ABNORMAL LOW (ref 8.9–10.3)
Chloride: 97 mmol/L — ABNORMAL LOW (ref 98–111)
Creatinine, Ser: 1.1 mg/dL (ref 0.61–1.24)
GFR calc Af Amer: >60 mL/min (ref >60)
GFR calc non Af Amer: >60 mL/min (ref >60)
Glucose, Bld: 110 mg/dL — ABNORMAL HIGH (ref 70–99)
Potassium: 3.9 mmol/L (ref 3.5–5.1)
Sodium: 134 mmol/L — ABNORMAL LOW (ref 135–145)

## 2018-07-05 MED ORDER — SODIUM CHLORIDE 0.9% FLUSH: 3.0000 mL | INTRAVENOUS | Status: DC | PRN

## 2018-07-05 MED ORDER — SODIUM CHLORIDE 0.9 % IV SOLN: 250.0000 mL | INTRAVENOUS | Status: DC | PRN

## 2018-07-05 MED ORDER — DOCUSATE SODIUM 100 MG PO CAPS
200.0000 mg | ORAL_CAPSULE | Freq: Every day | ORAL | Status: DC
  Administered 2018-07-05 – 2018-07-06 (×2): 200 mg via ORAL
  Filled 2018-07-05 (×3): qty 2

## 2018-07-05 MED ORDER — LOSARTAN POTASSIUM 50 MG PO TABS
50.0000 mg | ORAL_TABLET | Freq: Every day | ORAL | Status: DC
  Administered 2018-07-05: 50 mg via ORAL
  Filled 2018-07-05: qty 1

## 2018-07-05 MED ORDER — ONDANSETRON HCL 4 MG/2ML IJ SOLN: 4.0000 mg | Freq: Four times a day (QID) | INTRAMUSCULAR | Status: DC | PRN

## 2018-07-05 MED ORDER — INSULIN ASPART 100 UNIT/ML ~~LOC~~ SOLN
0.0000 [IU] | Freq: Three times a day (TID) | SUBCUTANEOUS | Status: DC
  Administered 2018-07-06 (×2): 2 [IU] via SUBCUTANEOUS

## 2018-07-05 MED ORDER — ONDANSETRON HCL 4 MG PO TABS: 4.0000 mg | ORAL_TABLET | Freq: Four times a day (QID) | ORAL | Status: DC | PRN

## 2018-07-05 MED ORDER — BISACODYL 5 MG PO TBEC: 10.0000 mg | DELAYED_RELEASE_TABLET | Freq: Every day | ORAL | Status: DC | PRN

## 2018-07-05 MED ORDER — TRAMADOL HCL 50 MG PO TABS: 50.0000 mg | ORAL_TABLET | Freq: Four times a day (QID) | ORAL | Status: DC | PRN

## 2018-07-05 MED ORDER — ALUM & MAG HYDROXIDE-SIMETH 200-200-20 MG/5ML PO SUSP: 15.0000 mL | ORAL | Status: DC | PRN

## 2018-07-05 MED ORDER — METOPROLOL SUCCINATE ER 50 MG PO TB24
50.0000 mg | ORAL_TABLET | Freq: Every day | ORAL | Status: DC
  Administered 2018-07-05 – 2018-07-07 (×3): 50 mg via ORAL
  Filled 2018-07-05 (×3): qty 1

## 2018-07-05 MED ORDER — MOVING RIGHT ALONG BOOK
Freq: Once | Status: AC
  Administered 2018-07-05: 1
  Filled 2018-07-05: qty 1

## 2018-07-05 MED ORDER — ASPIRIN EC 81 MG PO TBEC
81.0000 mg | DELAYED_RELEASE_TABLET | Freq: Every day | ORAL | Status: DC
  Administered 2018-07-05 – 2018-07-07 (×3): 81 mg via ORAL
  Filled 2018-07-05 (×3): qty 1

## 2018-07-05 MED ORDER — BISACODYL 10 MG RE SUPP: 10.0000 mg | Freq: Every day | RECTAL | Status: DC | PRN

## 2018-07-05 MED ORDER — SODIUM CHLORIDE 0.9% FLUSH
3.0000 mL | Freq: Two times a day (BID) | INTRAVENOUS | Status: DC
  Administered 2018-07-05 – 2018-07-06 (×3): 3 mL via INTRAVENOUS

## 2018-07-05 NOTE — Op Note (Signed)
NAMEARDIT, Randy [name] Randy [name]:[id number] ACCOUNT [id number] DATE OF BIRTH:January 19, 1946 FACILITY: [facility] LOCATION: [facility] PHYSICIAN:Jameil Whitmoyer Randy Bun, MD  OPERATIVE REPORT  DATE OF PROCEDURE:  07/01/2018  PREOPERATIVE DIAGNOSIS:  Recent non-ST elevation myocardial infarction and placement of intraaortic balloon pump.  POSTOPERATIVE DIAGNOSIS:  Recent non-ST elevation myocardial infarction and placement of intraaortic balloon pump.  SURGICAL PROCEDURE:  Urgent coronary artery bypass grafting with the preoperatively placed intraaortic balloon pump, coronary artery bypass grafting x4 with the left internal mammary to the left anterior descending coronary artery, reverse saphenous vein  graft to the diagonal coronary artery, reverse saphenous vein graft to the obtuse marginal coronary artery and reverse saphenous vein graft to the posterior lateral branch of the right coronary artery with right thigh and calf endoscopic vein harvesting  of the greater saphenous vein.  SURGEON:  [name], MD  FIRST ASSISTANT:  Enid Cutter, PA.  BRIEF HISTORY:  The patient is a 72 year old male with no known previous cardiac history who had been having increasing anginal symptoms over the previous month.  He had been traveling back and forth from [place] and on 2 days prior to surgery, had left  in his car to drive to [place], but began feeling very poorly with increasing shortness of breath.  He ultimately was seen in an outside emergency room and then transferred to [facility].  When he was first seen, he was breathing comfortably, chest  pain had resolved; however, the evening of his admission, he became very uncomfortable with evidence of respiratory failure requiring BiPAP.  He underwent urgent cardiac catheterization and placement of intraaortic balloon pump.  Cardiac surgery  consultation was considered.  The patient was seen with the balloon pump and diuresis.  He improved.  Cardiac  catheterization showed evidence of severe 3-vessel coronary artery disease with high-grade LAD, diagonal, right coronary obstruction with a  large posterolateral branch of the right system.  The echocardiogram was obtained and showed depressed LV function in the 30-40% range.  Coronary artery bypass grafting was recommended to the patient.  He was further diuresed, stabilized medically and  then proceeded to coronary artery bypass grafting.  Risks and options have been discussed with he and his family in detail and he was agreeable with proceeding.  It should also be noted on the time of his admission, a CT scan of the chest was done to  initially rule out pulmonary embolus, which showed small bilateral pulmonary emboli.  The patient had no previous history of pulmonary emboli.  DESCRIPTION OF PROCEDURE:  With Swan-Ganz and arterial line monitors in place, the patient underwent general endotracheal anesthesia without incident. TEE placed.  Skin of the chest and legs was prepped with Betadine, draped in the usual sterile manner.  Appropriate  timeout was performed.  We proceeded with endoscopic vein harvesting of the right greater saphenous vein from the thigh and upper calf.  The vein was of good quality and caliber.  Median sternotomy was performed.  The left internal mammary artery was  dissected down.  The distal artery was divided and had good free flow.  Pericardium was opened.  The patient was systemically heparinized.  The ascending aorta was cannulated.  The right atrium was cannulated.  An aortic root vent cardioplegia needle was  introduced into the ascending aorta.  The patient was placed on cardiopulmonary bypass 2.4 liters per minute per meter square.  Sites anastomosis were selected and dissected at the epicardium.  The patient's body temperature was cooled to 32  degrees.   Aortic crossclamp was applied and 500 mL of cold blood potassium cardioplegia was administered with diastolic arrest of  the heart.  Myocardial septal temperature was monitored throughout the crossclamp.  We turned our attention first to the right  coronary system.  The posterior lateral branch of the right coronary artery was a very large branch that supplied the major portion of the inferior surface of the heart.  The posterior descending coronary artery was somewhat diffusely diseased and  smaller.  We decided to open the posterior lateral branch.  This vessel easily admitted a 1.5 mm probe.  Using a running 7-0 Prolene, distal anastomosis was performed.  The heart was then elevated and the obtuse marginal coronary artery was opened and  admitted a 1.5 mm probe.  The very distal circumflex branch was identified, but was less than 1 mm in size and too small to bypass.  The obtuse marginal vessel was opened using a running 7-0 Prolene.  A distal anastomosis was performed with a running 7-0  Prolene.  Attention was then turned to the diagonal coronary artery, which was a fairly dominant vessel.  The anterior wall actually appeared larger than the LAD.  The vessel was opened.  A segment of reverse saphenous vein graft was anastomosed to the  diagonal coronary artery with a running 7-0 Prolene.  The left anterior descending coronary artery was then opened in its midportion and the vessel was relatively small.  It did admit a 1 mm probe distally.  It was approximately 1.2 mm in size.  Using a  running 8-0 Prolene, the left internal mammary artery was anastomosed to the left anterior descending coronary artery.  With crossclamp still in place, 3 punch aortotomies were performed and each of the 3 vein grafts were anastomosed to the ascending  aorta.  The heart was allowed to passively fill and deair.  The bulldog on the mammary artery was removed with a rise in myocardial septal temperature.  The heart was allowed to passively fill and deair.  The proximal anastomoses were completed.  Aortic  crossclamp was removed with total  crossclamp time of 101 minutes.  The patient was started on low-dose milrinone and dopamine infusion.  Body temperature was rewarmed to 37 degrees.  Atrial and ventricular pacing wires were applied.  Sites of anastomosis  were inspected and were free of bleeding.  With the patient's body temperature rewarmed, he was then ventilated and weaned from cardiopulmonary bypass with the intraaortic balloon pump functioning.  He remained hemodynamically stable.  He was  decannulated in the usual fashion.  Protamine sulfate was administered with operative field hemostatic.  A left pleural tube and Blake mediastinal drain were left in place.  Graft markers were applied.  The pericardium was loosely reapproximated.   Sternum was closed with #6 stainless steel wire.  Fascia closed with interrupted 0 Vicryl, running 3-0 Vicryl in subcutaneous tissue, 3-0 subcuticular stitch, and the skin edges.  Dry dressings were applied.  Sponge and needle count was reported as  correct at completion of procedure.  The patient tolerated the procedure without obvious complication.  Total pump time was 139 minutes.  The patient did not require any blood bank blood products during the operative procedure.  Sponge and needle count  was reported as correct.  RF wanding device gave clear code.  The patient was transferred to the surgical intensive care unit with intraaortic balloon pump in place on low dose milrinone and dopamine.  TN/NUANCE  D:07/04/2018 T:07/05/2018 JOB:[id number]

## 2018-07-05 NOTE — Progress Notes (Signed)
Patient ID: Randy [name], male   DOB: 02/12/1946, 72 y.o.   MRN: [id number] TCTS DAILY ICU PROGRESS NOTE                   [address]            [place] [address]          [phone]   4 Days Post-Op Procedure(s) (LRB): CORONARY ARTERY BYPASS GRAFTING (CABG) x 4, LIMA TO LAD, SVG TO OM1, SVG TO DIAG, SVG TO PL, USING LEFT INTERNAL MAMMARY ARTERY AND RIGHT GREATER SAPHENOUS VEIN HARVESTED ENDOSCOPICALLY (N/A) TRANSESOPHAGEAL ECHOCARDIOGRAM (TEE) (N/A)  Total Length of Stay:  LOS: 6 days   Subjective: Patient up in chair, feels better this morning, respiratory status continues to improve  Objective: Vital signs in last 24 hours: Temp:  [98 F (36.7 C)-98.5 F (36.9 C)] 98 F (36.7 C) (12/22 0700) Pulse Rate:  [66-89] 74 (12/22 0700) Cardiac Rhythm: Normal sinus rhythm (12/22 0400) Resp:  [18-28] 23 (12/22 0700) BP: (121-170)/(79-110) 164/90 (12/22 0700) SpO2:  [95 %-100 %] 96 % (12/22 0700)  Filed Weights   06/30/18 0930 07/02/18 0600 07/03/18 0600  Weight: 70.6 kg 76.4 kg 73.3 kg    Weight change:    Hemodynamic parameters for last 24 hours:    Intake/Output from previous day: 12/21 0701 - 12/22 0700 In: 1313.7 [P.O.:1080; I.V.:233.7] Out: 1000 [Urine:1000]  Intake/Output this shift: No intake/output data recorded.  Current Meds: Scheduled Meds: . acetaminophen  1,000 mg Oral Q6H   Or  . acetaminophen (TYLENOL) oral liquid 160 mg/5 mL  1,000 mg Per Tube Q6H  . amiodarone  150 mg Intravenous Once  . amiodarone  400 mg Oral Q12H   Followed by  . [START ON 07/11/2018] amiodarone  400 mg Oral Daily  . aspirin  81 mg Per Tube Daily   Or  . aspirin EC  81 mg Oral Daily  . atorvastatin  80 mg Oral q1800  . bisacodyl  10 mg Oral Daily   Or  . bisacodyl  10 mg Rectal Daily  . docusate sodium  200 mg Oral Daily  . furosemide  40 mg Intravenous Once  . furosemide  40 mg Oral Daily  . Influenza vac split quadrivalent PF  0.5 mL Intramuscular  Tomorrow-1000  . insulin aspart  0-24 Units Subcutaneous Q4H  . levalbuterol  0.63 mg Nebulization TID  . losartan  50 mg Oral Daily  . metoprolol succinate  50 mg Oral Daily  . pantoprazole  40 mg Oral Daily  . potassium chloride  20 mEq Oral Daily  . Rivaroxaban  10 mg Oral BID WC  . sodium chloride flush  3 mL Intravenous Q12H   Continuous Infusions: . sodium chloride Stopped (07/02/18 1812)  . sodium chloride 250 mL (07/02/18 0900)  . sodium chloride Stopped (07/02/18 1754)  . dexmedetomidine (PRECEDEX) IV infusion Stopped (07/01/18 2153)  . lactated ringers    . lactated ringers    . lactated ringers Stopped (07/03/18 0549)  . milrinone 0.125 mcg/kg/min (07/04/18 1900)  . phenylephrine (NEO-SYNEPHRINE) Adult infusion     PRN Meds:.sodium chloride, lactated ringers, levalbuterol, metoprolol tartrate, midazolam, morphine injection, ondansetron (ZOFRAN) IV, oxyCODONE, traMADol  General appearance: alert, cooperative and no distress Neurologic: intact Heart: regular rate and rhythm, S1, S2 normal, no murmur, click, rub or gallop Lungs: clear to auscultation bilaterally Abdomen: soft, non-tender; bowel sounds normal; no masses,  no organomegaly Extremities: extremities normal, atraumatic, no cyanosis  or edema and Homans sign is negative, no sign of DVT Wound: Sternum stable  Lab Results: CBC: Recent Labs    07/04/18 0624 07/05/18 0313  WBC 9.9 9.4  HGB 10.3* 11.1*  HCT 31.3* 32.7*  PLT 128* 148*   BMET:  Recent Labs    07/04/18 0624 07/05/18 0313  NA 136 134*  K 3.5 3.9  CL 100 97*  CO2 28 25  GLUCOSE 109* 110*  BUN 20 19  CREATININE 1.09 1.10  CALCIUM 7.7* 8.1*    CMET: Lab Results  Component Value Date   WBC 9.4 07/05/2018   HGB 11.1 (L) 07/05/2018   HCT 32.7 (L) 07/05/2018   PLT 148 (L) 07/05/2018   GLUCOSE 110 (H) 07/05/2018   CHOL 250 (H) 06/29/2018   TRIG 62 06/29/2018   HDL 69 06/29/2018   LDLCALC 169 (H) 06/29/2018   ALT 27 06/30/2018   AST  158 (H) 06/30/2018   NA 134 (L) 07/05/2018   K 3.9 07/05/2018   CL 97 (L) 07/05/2018   CREATININE 1.10 07/05/2018   BUN 19 07/05/2018   CO2 25 07/05/2018   TSH 7.113 (H) 07/04/2018   INR 1.42 07/01/2018   HGBA1C 5.4 06/29/2018      PT/INR: No results for input(s): LABPROT, INR in the last 72 hours. Radiology: Dg Chest 2 View  Result Date: 07/05/2018 CLINICAL DATA:  Postop CABG EXAM: CHEST - 2 VIEW COMPARISON:  07/04/2018 FINDINGS: Cardiac enlargement. Negative for edema. Small left effusion unchanged. Mild bibasilar atelectasis unchanged. Negative for pneumonia. Central venous catheter removed.  No pneumothorax. IMPRESSION: Small left effusion unchanged. Mild bibasilar atelectasis unchanged. Negative for edema. Electronically Signed   By: Franchot Gallo M.D.   On: 07/05/2018 06:36     Assessment/Plan: S/P Procedure(s) (LRB): CORONARY ARTERY BYPASS GRAFTING (CABG) x 4, LIMA TO LAD, SVG TO OM1, SVG TO DIAG, SVG TO PL, USING LEFT INTERNAL MAMMARY ARTERY AND RIGHT GREATER SAPHENOUS VEIN HARVESTED ENDOSCOPICALLY (N/A) TRANSESOPHAGEAL ECHOCARDIOGRAM (TEE) (N/A) Mobilize Diuresis Plan for transfer to step-down: see transfer orders TSH mildly elevated-patient started on amiodarone will need to be monitored Started on Xarelto last evening for pulmonary emboli on admission Discussed with cardiology will continue Xarelto and aspirin Wean off milrinone   Grace Isaac 07/05/2018 8:09 AM

## 2018-07-05 NOTE — Progress Notes (Signed)
Progress Note  Randy [name] Name: Randy [name] Date of Encounter: 07/05/2018  Primary Cardiologist: Dr Harrell Gave  Subjective   Mild CP with cough; no dyspnea  Inpatient Medications    Scheduled Meds: . acetaminophen  1,000 mg Oral Q6H   Or  . acetaminophen (TYLENOL) oral liquid 160 mg/5 mL  1,000 mg Per Tube Q6H  . amiodarone  150 mg Intravenous Once  . amiodarone  400 mg Oral Q12H   Followed by  . [START ON 07/11/2018] amiodarone  400 mg Oral Daily  . aspirin  81 mg Per Tube Daily   Or  . aspirin EC  81 mg Oral Daily  . atorvastatin  80 mg Oral q1800  . bisacodyl  10 mg Oral Daily   Or  . bisacodyl  10 mg Rectal Daily  . docusate sodium  200 mg Oral Daily  . furosemide  40 mg Intravenous Once  . furosemide  40 mg Oral Daily  . Influenza vac split quadrivalent PF  0.5 mL Intramuscular Tomorrow-1000  . insulin aspart  0-24 Units Subcutaneous Q4H  . levalbuterol  0.63 mg Nebulization TID  . metoprolol tartrate  25 mg Oral BID   Or  . metoprolol tartrate  25 mg Per Tube BID  . pantoprazole  40 mg Oral Daily  . potassium chloride  20 mEq Oral Daily  . Rivaroxaban  10 mg Oral BID WC  . sodium chloride flush  3 mL Intravenous Q12H   Continuous Infusions: . sodium chloride Stopped (07/02/18 1812)  . sodium chloride 250 mL (07/02/18 0900)  . sodium chloride Stopped (07/02/18 1754)  . dexmedetomidine (PRECEDEX) IV infusion Stopped (07/01/18 2153)  . lactated ringers    . lactated ringers    . lactated ringers Stopped (07/03/18 0549)  . milrinone 0.125 mcg/kg/min (07/04/18 1900)  . phenylephrine (NEO-SYNEPHRINE) Adult infusion     PRN Meds: sodium chloride, lactated ringers, levalbuterol, metoprolol tartrate, midazolam, morphine injection, ondansetron (ZOFRAN) IV, oxyCODONE, traMADol   Vital Signs    Vitals:   07/05/18 0400 07/05/18 0500 07/05/18 0600 07/05/18 0700  BP: (!) 161/110 (!) 170/103 (!) 141/91 (!) 164/90  Pulse: 89 71 89 74  Resp: 20 (!) 22 (!) 23 (!) 23   Temp:      TempSrc:      SpO2: 99% 98% 97% 96%  Weight:      Height:        Intake/Output Summary (Last 24 hours) at 07/05/2018 0729 Last data filed at 07/05/2018 0600 Gross per 24 hour  Intake 1313.65 ml  Output 1000 ml  Net 313.65 ml   Filed Weights   06/30/18 0930 07/02/18 0600 07/03/18 0600  Weight: 70.6 kg 76.4 kg 73.3 kg    Telemetry    Sinus with PVCs - Personally Reviewed   Physical Exam   GEN: No acute distress.  WD/WN Neck: No JVD, supple Cardiac: RRR Respiratory: Diminished BS bases GI: Soft, nontender, non-distended, no masses  MS: 1+ edema Neuro:  Grossly intact   Labs    Chemistry Recent Labs  Lab 06/29/18 1035 06/30/18 0318  07/03/18 0425  07/03/18 2022 07/04/18 0624 07/05/18 0313  NA 139 141   < > 138   < > 135 136 134*  K 4.2 4.1   < > 3.6   < > 3.8 3.5 3.9  CL 106 110   < > 103   < > 98 100 97*  CO2 22 17*   < > 26  --   --  28 25  GLUCOSE 116* 92   < > 113*   < > 127* 109* 110*  BUN 16 16   < > 18   < > 19 20 19   CREATININE 1.18 1.01   < > 1.06   < > 0.90 1.09 1.10  CALCIUM 8.8* 8.7*   < > 7.9*  --   --  7.7* 8.1*  PROT 6.8 5.9*  --   --   --   --   --   --   ALBUMIN 4.1 3.5  --   --   --   --   --   --   AST 85* 158*  --   --   --   --   --   --   ALT 20 27  --   --   --   --   --   --   ALKPHOS 38 32*  --   --   --   --   --   --   BILITOT 1.1 1.5*  --   --   --   --   --   --   GFRNONAA >60 >60   < > >60  --   --  >60 >60  GFRAA >60 >60   < > >60  --   --  >60 >60  ANIONGAP 11 14   < > 9  --   --  8 12   < > = values in this interval not displayed.     Hematology Recent Labs  Lab 07/03/18 0425  07/03/18 2022 07/04/18 0624 07/05/18 0313  WBC 11.8*  --   --  9.9 9.4  RBC 3.50*  --   --  3.35* 3.59*  HGB 10.9*   < > 11.2* 10.3* 11.1*  HCT 32.9*   < > 33.0* 31.3* 32.7*  MCV 94.0  --   --  93.4 91.1  MCH 31.1  --   --  30.7 30.9  MCHC 33.1  --   --  32.9 33.9  RDW 13.8  --   --  13.7 13.6  PLT 113*  --   --  128* 148*    < > = values in this interval not displayed.    Cardiac Enzymes Recent Labs  Lab 06/29/18 1344 06/29/18 2010 06/30/18 0318  TROPONINI 30.55* 46.33* 42.02*    Recent Labs  Lab 06/29/18 1040 06/29/18 1348  TROPIPOC 6.71* 24.18*     BNP Recent Labs  Lab 06/29/18 1344 06/30/18 0318  BNP 665.4* 1,369.9*     DDimer  Recent Labs  Lab 06/29/18 1035  DDIMER 0.93*     Radiology    Dg Chest 2 View  Result Date: 07/05/2018 CLINICAL DATA:  Postop CABG EXAM: CHEST - 2 VIEW COMPARISON:  07/04/2018 FINDINGS: Cardiac enlargement. Negative for edema. Small left effusion unchanged. Mild bibasilar atelectasis unchanged. Negative for pneumonia. Central venous catheter removed.  No pneumothorax. IMPRESSION: Small left effusion unchanged. Mild bibasilar atelectasis unchanged. Negative for edema. Electronically Signed   By: Franchot Gallo M.D.   On: 07/05/2018 06:36   Dg Chest Port 1 View  Result Date: 07/04/2018 CLINICAL DATA:  Postop from CABG.  Chest tube removal. EXAM: PORTABLE CHEST 1 VIEW COMPARISON:  07/03/2018 FINDINGS: Left chest tube has been removed since previous study. No pneumothorax visualized. Minimal residual left pleural fluid or thickening noted. Mild atelectasis seen in left lung base, also improved. Right lung is clear. Stable cardiomegaly. Right  jugular central venous catheter remains in appropriate position. IMPRESSION: No pneumothorax visualized following left chest tube removal. Decreased left pleural effusion and left basilar atelectasis. Electronically Signed   By: Earle Gell M.D.   On: 07/04/2018 07:37   Randy [name] Profile     72 y.o. male without significant PMH (no doctor in 20 years) but 70 years of tobacco use presented with NSTEMI; initially pain free despite very elevated troponin, but had an acute episode of decompensation on the evening of 06/29/18 requiring urgent catheterization. Found to have severe multivessel disease, IABP placed, underwent CABG  07/01/18.  Preoperative echocardiogram showed ejection fraction 40 to 45%, moderate mitral regurgitation and mild tricuspid regurgitation.  Assessment & Plan    1 coronary artery disease, s/p CABG-treat with aspirin 81 mg daily and statin.  2 ischemic cardiomyopathy-change Lopressor to Toprol 50 mg daily.  Add losartan 50 mg daily.  Titrate medications as tolerated by pulse and blood pressure.  3 recent pulmonary embolus-felt to be precipitated by recent travel.  Xarelto has been initiated.  Continue for 3 months and then discontinue.  4 hypertension-blood pressure elevated.  Add losartan 50 mg daily.  Increase as needed.  5 postoperative atrial fibrillation-remains in sinus rhythm today.  Would treat with amiodarone for approximately 8 weeks.  If he hold sinus rhythm can discontinue at that time.  6 hyperlipidemia-continue statin.  Check lipids and liver 4 to 6 weeks following discharge.  7 postoperative volume excess-continue Lasix.  For questions or updates, please contact [facility] Please consult [email or web address] for contact info under        Signed, Kirk Ruths, MD  07/05/2018, 7:29 AM

## 2018-07-06 ENCOUNTER — Inpatient Hospital Stay (HOSPITAL_COMMUNITY): Payer: Medicare PPO

## 2018-07-06 LAB — GLUCOSE, CAPILLARY
Glucose-Capillary: 102 mg/dL — ABNORMAL HIGH (ref 70–99)
Glucose-Capillary: 105 mg/dL — ABNORMAL HIGH (ref 70–99)
Glucose-Capillary: 129 mg/dL — ABNORMAL HIGH (ref 70–99)
Glucose-Capillary: 125 mg/dL — ABNORMAL HIGH (ref 70–99)

## 2018-07-06 LAB — CBC
HCT: 35.6 % — ABNORMAL LOW (ref 39.0–52.0)
Hemoglobin: 11.5 g/dL — ABNORMAL LOW (ref 13.0–17.0)
MCH: 29.5 pg (ref 26.0–34.0)
MCHC: 32.3 g/dL (ref 30.0–36.0)
MCV: 91.3 fL (ref 80.0–100.0)
Platelets: 196 10*3/uL (ref 150–400)
RBC: 3.9 MIL/uL — ABNORMAL LOW (ref 4.22–5.81)
RDW: 13.7 % (ref 11.5–15.5)
WBC: 8.1 10*3/uL (ref 4.0–10.5)
nRBC: 0 % (ref 0.0–0.2)

## 2018-07-06 LAB — BASIC METABOLIC PANEL
Anion gap: 10 (ref 5–15)
BUN: 17 mg/dL (ref 8–23)
CO2: 25 mmol/L (ref 22–32)
Calcium: 8.5 mg/dL — ABNORMAL LOW (ref 8.9–10.3)
Chloride: 102 mmol/L (ref 98–111)
Creatinine, Ser: 0.96 mg/dL (ref 0.61–1.24)
GFR calc Af Amer: >60 mL/min (ref >60)
GFR calc non Af Amer: >60 mL/min (ref >60)
Glucose, Bld: 114 mg/dL — ABNORMAL HIGH (ref 70–99)
Potassium: 3.9 mmol/L (ref 3.5–5.1)
Sodium: 137 mmol/L (ref 135–145)

## 2018-07-06 MED ORDER — LOSARTAN POTASSIUM 50 MG PO TABS
100.0000 mg | ORAL_TABLET | Freq: Every day | ORAL | Status: DC
  Administered 2018-07-06 – 2018-07-07 (×2): 100 mg via ORAL
  Filled 2018-07-06 (×2): qty 2

## 2018-07-06 NOTE — Progress Notes (Signed)
Patient ambulated in hallway with nursing staff approximately 777feet. Patient tolerated well, pt with some coughing during walk. Back in room will monitor patient. Axel Meas, Bettina Gavia RN

## 2018-07-06 NOTE — Progress Notes (Addendum)
CARDIAC REHAB PHASE I   PRE:  Rate/Rhythm: 80 SR with PVCs    BP: sitting 144/92    SaO2: 97 RA  MODE:  Ambulation: 690 ft   POST:  Rate/Rhythm: 96 SR    BP: sitting 174/100     SaO2: 94 RA  Pt moving very well although very SOB with talking or moving. SaO2 appropriate. Pt sts he has been very SOB with min activity for a long time (PTA). BP elevated.  To recliner, will ed with family later. [place], ACSM 07/06/2018 10:05 AM   Ed completed with pt, brother, and sister in law. Many questions, pt with good understanding. He is eager to quit smoking. Gave HF booklet and encouraged low sodium and daily wts. Will refer to [facility]. CM to talk with them regarding d/c as family is very anxious and will be out of town until Thursday. Brother plans to talk with private duty company. [phone] Yves Dill CES, ACSM 11:34 AM 07/06/2018

## 2018-07-06 NOTE — Progress Notes (Addendum)
   [facility] will sign off.    Medication Recommendations:   CAD: Continue Aspirin 81 mg daily and Lipitor 80 mg daily. Ischemic Cardiomyopathy: Continue Toprol-XL 50 mg daily and Losartan 100 mg daily. Continue to titrate as blood pressure and heart rate allow. Continue PO Lasix for post-operative volume excess.  Hypertension: Continue Losartan as above. Recent Pulmonary Embolism: Felt to be precipitated by recent travel. Continue Xarelto. Patient is currently on 10mg  twice daily. However, treatment for pulmonary embolism is 15 mg twice daily for 21 days and then 20mg  daily. Confirmed with Pharmacy. Patient will need treatment for 3 months. Post-Operative Atrial Fibrillation: Patient is currently in sinus rhythm on telemetry. Continue treatment with Amiodarone for 8 weeks. If patient remains in sinus rhythm, OK to discontinue Amiodarone after 8 weeks. Continue Xarelto as above.  Other recommendations (labs, testing, etc):   None.  Follow up as an outpatient:   Patient has outpatient follow-up scheduled for 07/20/2018 at 10:30am with Rosaria Ferries, PA-C.  Attending Note:   The patient was seen and examined.  Agree with assessment and plan as noted above.  Changes made to the above note as needed.  Patient seen and independently examined with Sande Rives, PA .   We discussed all aspects of the encounter. I agree with the assessment and plan as stated above.    Thayer Headings, Brooke Bonito., MD, [facility] 07/06/2018, 2:54 PM [address],  [place] Pager [phone]

## 2018-07-06 NOTE — Care Management Important Message (Signed)
Important Message  Patient Details  Name: Randy [name] MRN: [id number] Date of Birth: June 21, 1946   Medicare Important Message Given:  Yes    Kathelyn Gombos P Edelmira Gallogly 07/06/2018, 3:07 PM

## 2018-07-06 NOTE — Care Management Note (Signed)
Case Management Note Marvetta Gibbons RN, BSN Transitions of Care Unit 4E- RN Case Manager [phone]  Patient Details  Name: Randy [name] MRN: [id number] Date of Birth: 10/12/1945  Subjective/Objective:     Pt admitted s/p CABGx4            Action/Plan: PTA pt lived at home independent- is here visiting family- pt from [place]. Brother lives here, and son. CM spoke with pt, brother and SIL regarding transition needs- family making arrangements for transition plan, pt will not need RW, family has one available at home if needed. Pt would like to use [facility] pharmacy for discharge meds, will have scripts sent there to fill.   Expected Discharge Date:                  Expected Discharge Plan:  Home/Self Care  In-House Referral:  NA  Discharge planning Services  CM Consult  Post Acute Care Choice:  NA Choice offered to:  NA  DME Arranged:    DME Agency:     HH Arranged:    HH Agency:     Status of Service:  In process, will continue to follow  If discussed at Long Length of Stay Meetings, dates discussed:    Discharge Disposition: home/self care   Additional Comments:  Dawayne Patricia, RN 07/06/2018, 12:03 PM

## 2018-07-06 NOTE — Progress Notes (Signed)
Pt ambulated x 470 feet with rolling walker pt tolerated well

## 2018-07-06 NOTE — Progress Notes (Addendum)
      [address]       [place] [address]             [phone]      5 Days Post-Op Procedure(s) (LRB): CORONARY ARTERY BYPASS GRAFTING (CABG) x 4, LIMA TO LAD, SVG TO OM1, SVG TO DIAG, SVG TO PL, USING LEFT INTERNAL MAMMARY ARTERY AND RIGHT GREATER SAPHENOUS VEIN HARVESTED ENDOSCOPICALLY (N/A) TRANSESOPHAGEAL ECHOCARDIOGRAM (TEE) (N/A)   Subjective:  No new complaints.  He is feeling pretty good.  + ambulation  + BM  Objective: Vital signs in last 24 hours: Temp:  [97.6 F (36.4 C)-99 F (37.2 C)] 97.8 F (36.6 C) (12/23 0340) Pulse Rate:  [69-87] 77 (12/23 0340) Cardiac Rhythm: Normal sinus rhythm (12/23 0340) Resp:  [20-28] 22 (12/23 0340) BP: (141-159)/(75-136) 156/92 (12/23 0340) SpO2:  [96 %-100 %] 100 % (12/23 0340) Weight:  [71.2 kg-72.8 kg] 71.2 kg (12/23 0340)  Intake/Output from previous day: 12/22 0701 - 12/23 0700 In: 1018 [P.O.:960; I.V.:58] Out: 1495 [Urine:1495]  General appearance: alert, cooperative and no distress Heart: regular rate and rhythm Lungs: clear to auscultation bilaterally Abdomen: soft, non-tender; bowel sounds normal; no masses,  no organomegaly Extremities: extremities normal, atraumatic, no cyanosis or edema Wound: clean and dry  Lab Results: Recent Labs    07/05/18 0313 07/06/18 0402  WBC 9.4 8.1  HGB 11.1* 11.5*  HCT 32.7* 35.6*  PLT 148* 196   BMET:  Recent Labs    07/05/18 0313 07/06/18 0402  NA 134* 137  K 3.9 3.9  CL 97* 102  CO2 25 25  GLUCOSE 110* 114*  BUN 19 17  CREATININE 1.10 0.96  CALCIUM 8.1* 8.5*    PT/INR: No results for input(s): LABPROT, INR in the last 72 hours. ABG    Component Value Date/Time   PHART 7.343 (L) 07/02/2018 0447   HCO3 20.9 07/02/2018 0447   TCO2 28 07/03/2018 2022   ACIDBASEDEF 4.0 (H) 07/02/2018 0447   O2SAT 57.0 07/04/2018 1000   CBG (last 3)  Recent Labs    07/05/18 1624 07/05/18 2111 07/06/18 0610  GLUCAP 108* 123* 102*     Assessment/Plan: S/P Procedure(s) (LRB): CORONARY ARTERY BYPASS GRAFTING (CABG) x 4, LIMA TO LAD, SVG TO OM1, SVG TO DIAG, SVG TO PL, USING LEFT INTERNAL MAMMARY ARTERY AND RIGHT GREATER SAPHENOUS VEIN HARVESTED ENDOSCOPICALLY (N/A) TRANSESOPHAGEAL ECHOCARDIOGRAM (TEE) (N/A)  1. CV- PAF, currently NSR with PVCs, + HTN- will continue Toprol XL, increase Cozaar 2. Pulm- no acute issues, CXR with small pleural effusions, continue IS 3. Renal- creatinine WNL, continue Lasix, potassium 4. Bilateral PE, small- on Xarelto 5. Dispo- patient stable, will monitor BP today, if patient remains stable will d/c home in AM   LOS: 7 days    Ellwood Handler 07/06/2018  Continues to improve  On anticoagulation  Plan home in am I have seen and examined Jobe Marker and agree with the above assessment  and plan.  Grace Isaac MD Beeper [phone] Office [phone] 07/06/2018 5:52 PM

## 2018-07-07 LAB — GLUCOSE, CAPILLARY: Glucose-Capillary: 100 mg/dL — ABNORMAL HIGH (ref 70–99)

## 2018-07-07 MED ORDER — METOPROLOL SUCCINATE ER 50 MG PO TB24: 50.0000 mg | ORAL_TABLET | Freq: Every day | ORAL | 0 refills | Status: DC

## 2018-07-07 MED ORDER — ASPIRIN 81 MG PO TBEC: 81.0000 mg | DELAYED_RELEASE_TABLET | Freq: Every day | ORAL | Status: AC

## 2018-07-07 MED ORDER — ATORVASTATIN CALCIUM 80 MG PO TABS: 80.0000 mg | ORAL_TABLET | Freq: Every day | ORAL | 0 refills | Status: DC

## 2018-07-07 MED ORDER — TRAMADOL HCL 50 MG PO TABS: 50.0000 mg | ORAL_TABLET | Freq: Four times a day (QID) | ORAL | 0 refills | Status: DC | PRN

## 2018-07-07 MED ORDER — LOSARTAN POTASSIUM 100 MG PO TABS: 100.0000 mg | ORAL_TABLET | Freq: Every day | ORAL | 0 refills | Status: DC

## 2018-07-07 MED ORDER — AMIODARONE HCL 200 MG PO TABS: 200.0000 mg | ORAL_TABLET | Freq: Two times a day (BID) | ORAL | 0 refills | Status: DC

## 2018-07-07 MED ORDER — RIVAROXABAN 10 MG PO TABS: 10.0000 mg | ORAL_TABLET | Freq: Two times a day (BID) | ORAL | 0 refills | Status: DC

## 2018-07-07 MED ORDER — RIVAROXABAN (XARELTO) VTE STARTER PACK (15 & 20 MG): ORAL_TABLET | ORAL | 0 refills | Status: DC

## 2018-07-07 MED ORDER — FUROSEMIDE 40 MG PO TABS: 40.0000 mg | ORAL_TABLET | Freq: Every day | ORAL | 0 refills | Status: DC

## 2018-07-07 MED ORDER — POTASSIUM CHLORIDE CRYS ER 20 MEQ PO TBCR: 20.0000 meq | EXTENDED_RELEASE_TABLET | Freq: Every day | ORAL | 0 refills | Status: DC

## 2018-07-07 MED FILL — ATORVASTATIN CALCIUM 80 MG: 80 | 30 days supply | Qty: 30 | Fill #0

## 2018-07-07 MED FILL — FUROSEMIDE 40 MG TABLET: 40 | 30 days supply | Qty: 30 | Fill #0

## 2018-07-07 MED FILL — LOSARTAN POTASSIUM 100 MG T: 100 | 30 days supply | Qty: 30 | Fill #0

## 2018-07-07 MED FILL — METOPROLOL SUCCINATE ER 50: 50 | 30 days supply | Qty: 30 | Fill #0

## 2018-07-07 MED FILL — AMIODARONE HCL 200 MG TAB: 200 | 30 days supply | Qty: 60 | Fill #0

## 2018-07-07 MED FILL — POTASSIUM CL ER 20 MEQ TABL: 20 | 30 days supply | Qty: 30 | Fill #0

## 2018-07-07 MED FILL — traMADol HCL 50 MG TABS: 50 | 7 days supply | Qty: 28 | Fill #0

## 2018-07-07 MED FILL — XARELTO STARTER PACK: 15 & 20 | 30 days supply | Qty: 51 | Fill #0

## 2018-07-07 MED FILL — Sodium Bicarbonate IV Soln 8.4%: INTRAVENOUS | Qty: 50 | Status: AC

## 2018-07-07 MED FILL — Sodium Chloride IV Soln 0.9%: INTRAVENOUS | Qty: 2000 | Status: AC

## 2018-07-07 MED FILL — Electrolyte-R (PH 7.4) Solution: INTRAVENOUS | Qty: 3000 | Status: AC

## 2018-07-07 MED FILL — Mannitol IV Soln 20%: INTRAVENOUS | Qty: 500 | Status: AC

## 2018-07-07 MED FILL — Lidocaine HCl(Cardiac) IV PF Soln Pref Syr 100 MG/5ML (2%): INTRAVENOUS | Qty: 5 | Status: AC

## 2018-07-07 MED FILL — Heparin Sodium (Porcine) Inj 1000 Unit/ML: INTRAMUSCULAR | Qty: 20 | Status: AC

## 2018-07-07 NOTE — Progress Notes (Signed)
07/07/2018 0845 Chest Tube sutures removed.  Pt tolerated well. [place]

## 2018-07-07 NOTE — Care Management Note (Signed)
Case Management Note Marvetta Gibbons RN, BSN Transitions of Care Unit 4E- RN Case Manager [phone]  Patient Details  Name: Randy [name] MRN: [id number] Date of Birth: October 29, 1945  Subjective/Objective:     Pt admitted s/p CABGx4            Action/Plan: PTA pt lived at home independent- is here visiting family- pt from [place]. Brother lives here, and son. CM spoke with pt, brother and SIL regarding transition needs- family making arrangements for transition plan, pt will not need RW, family has one available at home if needed. Pt would like to use [facility] pharmacy for discharge meds, will have scripts sent there to fill.   Expected Discharge Date:  07/07/18               Expected Discharge Plan:  Home/Self Care  In-House Referral:  NA  Discharge planning Services  CM Consult  Post Acute Care Choice:  NA Choice offered to:  NA  DME Arranged:    DME Agency:     HH Arranged:    HH Agency:     Status of Service:  Completed, signed off  If discussed at [employer] of Stay Meetings, dates discussed:    Discharge Disposition: home/self care   Additional Comments:  07/07/18- 0945- Marvetta Gibbons RN, CM- pt medically stable for discharge- [facility] pharmacy has filled medications and delivered to bedside for transition home. Pt to transition home with family here in town.   Dawayne Patricia, RN 07/07/2018, 9:45 AM

## 2018-07-07 NOTE — Progress Notes (Addendum)
      [address]       [place] [address]             [phone]      6 Days Post-Op Procedure(s) (LRB): CORONARY ARTERY BYPASS GRAFTING (CABG) x 4, LIMA TO LAD, SVG TO OM1, SVG TO DIAG, SVG TO PL, USING LEFT INTERNAL MAMMARY ARTERY AND RIGHT GREATER SAPHENOUS VEIN HARVESTED ENDOSCOPICALLY (N/A) TRANSESOPHAGEAL ECHOCARDIOGRAM (TEE) (N/A)   Subjective:  No new complaints.  He is ready to go home today.  Objective: Vital signs in last 24 hours: Temp:  [97.4 F (36.3 C)-98.7 F (37.1 C)] 97.7 F (36.5 C) (12/24 0405) Pulse Rate:  [74-84] 79 (12/24 0405) Cardiac Rhythm: Normal sinus rhythm (12/24 0700) Resp:  [20-26] 26 (12/24 0405) BP: (118-158)/(76-96) 118/81 (12/24 0405) SpO2:  [96 %-100 %] 97 % (12/24 0405) Weight:  [71.2 kg] 71.2 kg (12/24 0405)  Intake/Output from previous day: 12/23 0701 - 12/24 0700 In: 480 [P.O.:480] Out: -   General appearance: alert, cooperative and no distress Heart: regular rate and rhythm Lungs: clear to auscultation bilaterally Abdomen: soft, non-tender; bowel sounds normal; no masses,  no organomegaly Extremities: edema trace Wound: clean and dry  Lab Results: Recent Labs    07/05/18 0313 07/06/18 0402  WBC 9.4 8.1  HGB 11.1* 11.5*  HCT 32.7* 35.6*  PLT 148* 196   BMET:  Recent Labs    07/05/18 0313 07/06/18 0402  NA 134* 137  K 3.9 3.9  CL 97* 102  CO2 25 25  GLUCOSE 110* 114*  BUN 19 17  CREATININE 1.10 0.96  CALCIUM 8.1* 8.5*    PT/INR: No results for input(s): LABPROT, INR in the last 72 hours. ABG    Component Value Date/Time   PHART 7.343 (L) 07/02/2018 0447   HCO3 20.9 07/02/2018 0447   TCO2 28 07/03/2018 2022   ACIDBASEDEF 4.0 (H) 07/02/2018 0447   O2SAT 57.0 07/04/2018 1000   CBG (last 3)  Recent Labs    07/06/18 1703 07/06/18 2134 07/07/18 0605  GLUCAP 129* 125* 100*    Assessment/Plan: S/P Procedure(s) (LRB): CORONARY ARTERY BYPASS GRAFTING (CABG) x 4, LIMA TO LAD, SVG TO  OM1, SVG TO DIAG, SVG TO PL, USING LEFT INTERNAL MAMMARY ARTERY AND RIGHT GREATER SAPHENOUS VEIN HARVESTED ENDOSCOPICALLY (N/A) TRANSESOPHAGEAL ECHOCARDIOGRAM (TEE) (N/A)  1. CV- NSR, BP improved- continue Toprol, Cozaar, amiodarone 200 mg BID 2. Pulm- no acute issues, continue IS 3. Renal- creatinine has been stable, continue lasix then transition to prn regimen per Cardiology 4. Small Bilateral PE, continue Xarelto 5. Dispo- patient stable, will d/c home today   LOS: 8 days    Ellwood Handler 07/07/2018

## 2018-07-11 ENCOUNTER — Other Ambulatory Visit: Payer: Self-pay | Admitting: Physician Assistant

## 2018-07-11 NOTE — Progress Notes (Signed)
Randy [name] contacted after hours line with complaints of worsening leg pain/swelling.  He states that his leg has been oozing since he was in the hospital and this has somewhat improved.  He is now experiencing increased swelling in his RLE and increased pain in his foot and ankle.  He denies bruising around his ankle, but does states that he has numbness and burning along his foot.  He cant' say that the leg is red and he denies fever.  His weight is 155.   A/P:  1. Numbness/Burning- likely nerve injury due to [facility] 2. Increased swelling of RLE, could be DVT as he states mainly in his calf and ankle region.  He is on Xarelto for PE already. His weight is actually down an additional pound since discharge.   3. Patient will continue Lasix, Xarelto.  He is instructed to continue ambulating as tolerated, elevate leg at rest.  He will contact office on Monday morning for his leg to be checked in the office.   Ellwood Handler, PA-C

## 2018-07-14 ENCOUNTER — Other Ambulatory Visit: Payer: Self-pay

## 2018-07-14 ENCOUNTER — Ambulatory Visit (INDEPENDENT_AMBULATORY_CARE_PROVIDER_SITE_OTHER): Payer: Self-pay | Admitting: Physician Assistant

## 2018-07-14 ENCOUNTER — Inpatient Hospital Stay (HOSPITAL_COMMUNITY)
Admission: AD | Admit: 2018-07-14 | Discharge: 2018-07-18 | DRG: 603 | Disposition: A | Discharge status: Home / Self Care | Payer: Medicare PPO | Source: Ambulatory Visit | Attending: Cardiothoracic Surgery | Admitting: Cardiothoracic Surgery

## 2018-07-14 ENCOUNTER — Other Ambulatory Visit (HOSPITAL_COMMUNITY): Payer: Self-pay | Admitting: Physician Assistant

## 2018-07-14 ENCOUNTER — Encounter (HOSPITAL_COMMUNITY): Payer: Self-pay | Admitting: *Deleted

## 2018-07-14 ENCOUNTER — Ambulatory Visit: Payer: Self-pay

## 2018-07-14 VITALS — BP 110/70 | HR 83 | Temp 97.8°F | Resp 18 | Ht 64.0 in | Wt 150.8 lb

## 2018-07-14 DIAGNOSIS — I10 Essential (primary) hypertension: Secondary | ICD-10-CM | POA: Diagnosis present

## 2018-07-14 DIAGNOSIS — Z951 Presence of aortocoronary bypass graft: Secondary | ICD-10-CM | POA: Diagnosis not present

## 2018-07-14 DIAGNOSIS — Z23 Encounter for immunization: Secondary | ICD-10-CM | POA: Diagnosis not present

## 2018-07-14 DIAGNOSIS — Z79891 Long term (current) use of opiate analgesic: Secondary | ICD-10-CM | POA: Diagnosis not present

## 2018-07-14 DIAGNOSIS — Z8249 Family history of ischemic heart disease and other diseases of the circulatory system: Secondary | ICD-10-CM | POA: Diagnosis not present

## 2018-07-14 DIAGNOSIS — Z7901 Long term (current) use of anticoagulants: Secondary | ICD-10-CM | POA: Diagnosis not present

## 2018-07-14 DIAGNOSIS — Z79899 Other long term (current) drug therapy: Secondary | ICD-10-CM | POA: Diagnosis not present

## 2018-07-14 DIAGNOSIS — E785 Hyperlipidemia, unspecified: Secondary | ICD-10-CM | POA: Diagnosis present

## 2018-07-14 DIAGNOSIS — Z86711 Personal history of pulmonary embolism: Secondary | ICD-10-CM

## 2018-07-14 DIAGNOSIS — D62 Acute posthemorrhagic anemia: Secondary | ICD-10-CM | POA: Diagnosis present

## 2018-07-14 DIAGNOSIS — Z87891 Personal history of nicotine dependence: Secondary | ICD-10-CM

## 2018-07-14 DIAGNOSIS — Z7982 Long term (current) use of aspirin: Secondary | ICD-10-CM

## 2018-07-14 DIAGNOSIS — L03115 Cellulitis of right lower limb: Secondary | ICD-10-CM

## 2018-07-14 DIAGNOSIS — I252 Old myocardial infarction: Secondary | ICD-10-CM

## 2018-07-14 LAB — CBC
HCT: 35.5 % — ABNORMAL LOW (ref 39.0–52.0)
Hemoglobin: 11.3 g/dL — ABNORMAL LOW (ref 13.0–17.0)
MCH: 29.4 pg (ref 26.0–34.0)
MCHC: 31.8 g/dL (ref 30.0–36.0)
MCV: 92.2 fL (ref 80.0–100.0)
Platelets: 531 10*3/uL — ABNORMAL HIGH (ref 150–400)
RBC: 3.85 MIL/uL — ABNORMAL LOW (ref 4.22–5.81)
RDW: 13.9 % (ref 11.5–15.5)
WBC: 12.3 10*3/uL — ABNORMAL HIGH (ref 4.0–10.5)
nRBC: 0 % (ref 0.0–0.2)

## 2018-07-14 LAB — BASIC METABOLIC PANEL
Anion gap: 12 (ref 5–15)
BUN: 28 mg/dL — ABNORMAL HIGH (ref 8–23)
CHLORIDE: 100 mmol/L (ref 98–111)
CO2: 25 mmol/L (ref 22–32)
Calcium: 9.1 mg/dL (ref 8.9–10.3)
Creatinine, Ser: 1.36 mg/dL — ABNORMAL HIGH (ref 0.61–1.24)
GFR calc Af Amer: >60 mL/min (ref >60)
GFR calc non Af Amer: 52 mL/min — ABNORMAL LOW (ref >60)
Glucose, Bld: 103 mg/dL — ABNORMAL HIGH (ref 70–99)
Potassium: 5 mmol/L (ref 3.5–5.1)
Sodium: 137 mmol/L (ref 135–145)

## 2018-07-14 MED ORDER — METOPROLOL SUCCINATE ER 50 MG PO TB24
50.0000 mg | ORAL_TABLET | Freq: Every day | ORAL | Status: DC
  Administered 2018-07-15 – 2018-07-18 (×4): 50 mg via ORAL
  Filled 2018-07-14 (×4): qty 1

## 2018-07-14 MED ORDER — INFLUENZA VAC SPLIT QUAD 0.5 ML IM SUSY
0.5000 mL | PREFILLED_SYRINGE | INTRAMUSCULAR | Status: AC
  Administered 2018-07-16: 0.5 mL via INTRAMUSCULAR
  Filled 2018-07-14: qty 0.5

## 2018-07-14 MED ORDER — PIPERACILLIN-TAZOBACTAM 3.375 G IVPB
3.3750 g | Freq: Three times a day (TID) | INTRAVENOUS | Status: DC
  Administered 2018-07-14 – 2018-07-18 (×12): 3.375 g via INTRAVENOUS
  Filled 2018-07-14 (×13): qty 50

## 2018-07-14 MED ORDER — LOSARTAN POTASSIUM 50 MG PO TABS
100.0000 mg | ORAL_TABLET | Freq: Every day | ORAL | Status: DC
  Administered 2018-07-15 – 2018-07-18 (×4): 100 mg via ORAL
  Filled 2018-07-14 (×4): qty 2

## 2018-07-14 MED ORDER — VANCOMYCIN HCL 10 G IV SOLR
1500.0000 mg | Freq: Once | INTRAVENOUS | Status: AC
  Administered 2018-07-14: 1500 mg via INTRAVENOUS
  Filled 2018-07-14: qty 1500

## 2018-07-14 MED ORDER — ACETAMINOPHEN 325 MG PO TABS: 650.0000 mg | ORAL_TABLET | Freq: Four times a day (QID) | ORAL | Status: DC | PRN

## 2018-07-14 MED ORDER — ASPIRIN EC 81 MG PO TBEC
81.0000 mg | DELAYED_RELEASE_TABLET | Freq: Every day | ORAL | Status: DC
  Administered 2018-07-15 – 2018-07-18 (×4): 81 mg via ORAL
  Filled 2018-07-14 (×4): qty 1

## 2018-07-14 MED ORDER — PNEUMOCOCCAL VAC POLYVALENT 25 MCG/0.5ML IJ INJ
0.5000 mL | INJECTION | INTRAMUSCULAR | Status: AC
  Administered 2018-07-16: 0.5 mL via INTRAMUSCULAR
  Filled 2018-07-14: qty 0.5

## 2018-07-14 MED ORDER — AMIODARONE HCL 200 MG PO TABS
200.0000 mg | ORAL_TABLET | Freq: Every day | ORAL | Status: DC
  Administered 2018-07-15 – 2018-07-18 (×4): 200 mg via ORAL
  Filled 2018-07-14 (×4): qty 1

## 2018-07-14 MED ORDER — ATORVASTATIN CALCIUM 80 MG PO TABS
80.0000 mg | ORAL_TABLET | Freq: Every day | ORAL | Status: DC
  Administered 2018-07-15 – 2018-07-17 (×3): 80 mg via ORAL
  Filled 2018-07-14 (×3): qty 1

## 2018-07-14 MED ORDER — RIVAROXABAN 15 MG PO TABS
15.0000 mg | ORAL_TABLET | Freq: Two times a day (BID) | ORAL | Status: DC
  Administered 2018-07-14 – 2018-07-18 (×8): 15 mg via ORAL
  Filled 2018-07-14 (×8): qty 1

## 2018-07-14 MED ORDER — VANCOMYCIN HCL 10 G IV SOLR
1250.0000 mg | INTRAVENOUS | Status: DC
  Administered 2018-07-15: 1250 mg via INTRAVENOUS
  Filled 2018-07-14: qty 1250

## 2018-07-14 MED ORDER — TRAMADOL HCL 50 MG PO TABS: 50.0000 mg | ORAL_TABLET | Freq: Four times a day (QID) | ORAL | Status: DC | PRN

## 2018-07-14 NOTE — Progress Notes (Signed)
Pharmacy Antibiotic Note  Brodric Schauer is a 72 y.o. male admitted on 07/14/2018 with complaints of increased swelling, redness, burning and discomfort in his RLE.  Pharmacy has been consulted for vancomycin and zosyn dosing.  Patient is currently afebrile, baseline labs pending.   Vancomycin 1250 mg IV Q 24 hrs. Goal AUC 400-550. Expected AUC: 486 SCr used: 0.98  Plan: Vancomycin 1250 IV every 24 hours.  Goal trough 10-15 mcg/mL. Zosyn 3.375g IV q8h (4 hour infusion).   Temp (24hrs), Avg:97.8 F (36.6 C), Min:97.8 F (36.6 C), Max:97.8 F (36.6 C)  No results for input(s): WBC, CREATININE, LATICACIDVEN, VANCOTROUGH, VANCOPEAK, VANCORANDOM, GENTTROUGH, GENTPEAK, GENTRANDOM, TOBRATROUGH, TOBRAPEAK, TOBRARND, AMIKACINPEAK, AMIKACINTROU, AMIKACIN in the last 168 hours.  Estimated Creatinine Clearance: 59.1 mL/min (by C-G formula based on SCr of 0.96 mg/dL).    No Known Allergies   Thank you for allowing pharmacy to be a part of this patient's care.  Erin Hearing PharmD., BCPS Clinical Pharmacist 07/14/2018 6:16 PM

## 2018-07-14 NOTE — Progress Notes (Signed)
HPI: Patient is S/P CABG x 4 on 07/01/2018.  He was discharged home from the hospital on 07/07/2018.  He initially did okay.  However, he contacted the office after hours answering service on Saturday 07/11/2018 with complaints of increased swelling, redness, burning and discomfort in his RLE.  He stated this had progressed significantly since hospital discharge.  He states the leg originally was draining bloody fluid in the hospital but this had now become clear.  He denies any fever or purulence noted from his [facility] site.  Current Outpatient Medications  Medication Sig Dispense Refill  . amiodarone (PACERONE) 200 MG tablet Take 1 tablet (200 mg total) by mouth 2 (two) times daily. X 7 days, then decrease to 200 mg daily 60 tablet 0  . aspirin EC 81 MG EC tablet Take 1 tablet (81 mg total) by mouth daily.    Marland Kitchen atorvastatin (LIPITOR) 80 MG tablet Take 1 tablet (80 mg total) by mouth daily at 6 PM. 30 tablet 0  . furosemide (LASIX) 40 MG tablet Take 1 tablet (40 mg total) by mouth daily. X 3 days, then take on a daily basis as needed for weight gain of 3 lbs 30 tablet 0  . losartan (COZAAR) 100 MG tablet Take 1 tablet (100 mg total) by mouth daily. 30 tablet 0  . metoprolol succinate (TOPROL-XL) 50 MG 24 hr tablet Take 1 tablet (50 mg total) by mouth daily. Take with or immediately following a meal. 30 tablet 0  . potassium chloride SA (K-DUR,KLOR-CON) 20 MEQ tablet Take 1 tablet (20 mEq total) by mouth daily. X 3 days, then only on days you take Lasix 30 tablet 0  . Rivaroxaban 15 & 20 MG TBPK Take as directed on package: Start with one 15mg  tablet by mouth twice a day with food. On Day 22, switch to one 20mg  tablet once a day with food. 51 each 0  . traMADol (ULTRAM) 50 MG tablet Take 1 tablet (50 mg total) by mouth every 6 (six) hours as needed for moderate pain. 28 tablet 0   No current facility-administered medications for this visit.     Physical Exam:  BP 110/70 (BP Location: Left Arm,  Patient Position: Sitting, Cuff Size: Normal)   Pulse 83   Resp 18   Ht 5\' 4"  (1.626 m)   Wt 150 lb 12.8 oz (68.4 kg)   SpO2 96% Comment: RA  BMI 25.88 kg/m   Gen: no apparent distress Heart: RRR Lungs: CTA bilaterally Ext: RLE very erythematous with extension into foot, eschar present on EVH site, no purulence noted, marked edema Sternotomy: C/D/I  A/P"  1. RLE cellulitis- unfortunately I do not think this can be treated with oral ABX.  Dr. Roxan Hockey examined patient and agreed.  We will admit to the hospital for a few days of IV ABX   Ellwood Handler, PA-C [facility] [phone]

## 2018-07-14 NOTE — Progress Notes (Signed)
Patient to unit as direct admit. R leg red distal to knee, swollen and warm to touch. CHG bath given, telemetry applied and CCMD notified. Brother at bedside, pt alert and oriented, able to ambulate in room. Oriented to room unit and call light.

## 2018-07-14 NOTE — H&P (Signed)
[address]       [place],[address] [address]             [phone]        [place] Medical Record #[id number] Date of Birth: 03/01/1946  Chief Complaint:  Right Leg pain, swelling, redness  History of Present Illness:      Mr. Didonato is well known to [facility]. He is S/P CABG x 4 on 07/01/2018.  He was discharged home from the hospital on 07/07/2018.  He initially did okay.  However, he contacted the office after hours answering service on Saturday 07/11/2018 with complaints of increased swelling, redness, burning and discomfort in his RLE.  He stated this had progressed significantly since hospital discharge.  He states the leg originally was draining bloody fluid in the hospital but this had now become clear.  He denies any fever or purulence noted from his [facility] site.  He was evaluated in the office and it was felt admission was warranted.   Past Medical History:  Diagnosis Date  . Alcohol abuse    Last drink was 10 years ago (2009).  Marland Kitchen Hypertension   . Tobacco abuse     Past Surgical History:  Procedure Laterality Date  . CORONARY ARTERY BYPASS GRAFT N/A 07/01/2018   Procedure: CORONARY ARTERY BYPASS GRAFTING (CABG) x 4, LIMA TO LAD, SVG TO OM1, SVG TO DIAG, SVG TO PL, USING LEFT INTERNAL MAMMARY ARTERY AND RIGHT GREATER SAPHENOUS VEIN HARVESTED ENDOSCOPICALLY;  Surgeon: Grace Isaac, MD;  Location: [place];  Service: Open Heart Surgery;  Laterality: N/A;  . IABP INSERTION Right 06/29/2018   Procedure: IABP Insertion;  Surgeon: Sherren Mocha, MD;  Location: [place] [facility];  Service: Cardiovascular;  Laterality: Right;  . LEFT HEART CATH AND CORONARY ANGIOGRAPHY N/A 06/29/2018   Procedure: LEFT HEART CATH AND CORONARY ANGIOGRAPHY;  Surgeon: Sherren Mocha, MD;  Location: [place] [facility];  Service: Cardiovascular;  Laterality: N/A;  . TEE WITHOUT CARDIOVERSION N/A 07/01/2018   Procedure: TRANSESOPHAGEAL ECHOCARDIOGRAM (TEE);  Surgeon: Grace Isaac, MD;  Location: [place];  Service: Open Heart Surgery;  Laterality: N/A;    Social History   Tobacco Use  Smoking Status Former Smoker  . Packs/day: 0.50  . Years: 50.00  . Pack years: 25.00  . Types: Cigarettes  . Last attempt to quit: 06/29/2018  . Years since quitting: 0.0  Smokeless Tobacco Never Used    Social History   Substance and Sexual Activity  Alcohol Use Not Currently     No Known Allergies  No current facility-administered medications for this encounter.    Current Outpatient Medications  Medication Sig Dispense Refill  . amiodarone (PACERONE) 200 MG tablet Take 1 tablet (200 mg total) by mouth 2 (two) times daily. X 7 days, then decrease to 200 mg daily 60 tablet 0  . aspirin EC 81 MG EC tablet Take 1 tablet (81 mg total) by mouth daily.    Marland Kitchen atorvastatin (LIPITOR) 80 MG tablet Take 1 tablet (80 mg total) by mouth daily at 6 PM. 30 tablet 0  . furosemide (LASIX) 40 MG tablet Take 1 tablet (40 mg total) by mouth daily. X 3 days, then take on a daily basis as needed for weight gain of 3 lbs 30 tablet 0  . losartan (COZAAR) 100 MG tablet Take 1 tablet (100 mg total) by mouth daily. 30 tablet 0  . metoprolol succinate (TOPROL-XL) 50 MG 24  hr tablet Take 1 tablet (50 mg total) by mouth daily. Take with or immediately following a meal. 30 tablet 0  . potassium chloride SA (K-DUR,KLOR-CON) 20 MEQ tablet Take 1 tablet (20 mEq total) by mouth daily. X 3 days, then only on days you take Lasix 30 tablet 0  . Rivaroxaban 15 & 20 MG TBPK Take as directed on package: Start with one 15mg  tablet by mouth twice a day with food. On Day 22, switch to one 20mg  tablet once a day with food. 51 each 0  . traMADol (ULTRAM) 50 MG tablet Take 1 tablet (50 mg total) by mouth every 6 (six) hours as needed for moderate pain. 28 tablet 0    No medications prior to admission.    Family History  Problem Relation Age of Onset  . CAD Father        died from a "massive heart attack"  in his mid 52's  . Stroke Neg Hx   . Sudden death Neg Hx      Review of Systems:   ROS   Constitutional: negative for chills, fevers and malaise Respiratory: negative for cough and dyspnea on exertion Cardiovascular: negative for chest pressure/discomfort and irregular heart beat Gastrointestinal: negative for diarrhea, nausea and vomiting Extremities: Right LE red, pain, swelling Neurological: negative    Physical Exam:  Gen: no apparent distress Heart: RRR Lungs: CTA bilaterally Ext: RLE erythematous, + edema, tender to palpation, LLE WNL Sternotomy: healing without evidence of infection  Assessment / Plan:      1. Admit to inpatient 2. RLE Cellulitis- Vanc/Zosyn per pharmacy 3. Small bilateral PE on previous admission, continue Xarelto at home dose of 15 mg BID 4. CV- S/p CABG x3, continue Toprol XL, Cozaar 5. Check BMET, CBC- can restart Lasix if indicated  I  spent 60 minutes counseling the patient face to face.   Leonidus Rowand PA-C 07/14/2018 1:12 PM

## 2018-07-15 LAB — BASIC METABOLIC PANEL
Anion gap: 10 (ref 5–15)
BUN: 25 mg/dL — ABNORMAL HIGH (ref 8–23)
CALCIUM: 8.9 mg/dL (ref 8.9–10.3)
CO2: 25 mmol/L (ref 22–32)
Chloride: 101 mmol/L (ref 98–111)
Creatinine, Ser: 1.22 mg/dL (ref 0.61–1.24)
GFR calc Af Amer: >60 mL/min (ref >60)
GFR calc non Af Amer: 59 mL/min — ABNORMAL LOW (ref >60)
Glucose, Bld: 104 mg/dL — ABNORMAL HIGH (ref 70–99)
Potassium: 4.5 mmol/L (ref 3.5–5.1)
Sodium: 136 mmol/L (ref 135–145)

## 2018-07-15 LAB — CBC
HCT: 34.8 % — ABNORMAL LOW (ref 39.0–52.0)
Hemoglobin: 11 g/dL — ABNORMAL LOW (ref 13.0–17.0)
MCH: 29.3 pg (ref 26.0–34.0)
MCHC: 31.6 g/dL (ref 30.0–36.0)
MCV: 92.6 fL (ref 80.0–100.0)
Platelets: 553 10*3/uL — ABNORMAL HIGH (ref 150–400)
RBC: 3.76 MIL/uL — ABNORMAL LOW (ref 4.22–5.81)
RDW: 13.9 % (ref 11.5–15.5)
WBC: 12.8 10*3/uL — ABNORMAL HIGH (ref 4.0–10.5)
nRBC: 0 % (ref 0.0–0.2)

## 2018-07-15 NOTE — Progress Notes (Addendum)
      [address]       ,[place] [address]             [phone]         Subjective: Feels okay this morning. Asking for more pillows so he can raise his legs  Objective: Vital signs in last 24 hours: Temp:  [97.7 F (36.5 C)-98.6 F (37 C)] 98.6 F (37 C) (01/01 0410) Pulse Rate:  [68-83] 69 (01/01 0003) Cardiac Rhythm: Normal sinus rhythm (01/01 0700) Resp:  [14-24] 14 (01/01 0410) BP: (110-123)/(70-83) 122/76 (01/01 0410) SpO2:  [96 %-100 %] 98 % (01/01 0410) Weight:  [68.4 kg-68.7 kg] 68.7 kg (12/31 1816)     Intake/Output from previous day: 12/31 0701 - 01/01 0700 In: 170 [P.O.:120; IV Piggyback:50] Out: -  Intake/Output this shift: No intake/output data recorded.  General appearance: alert, cooperative and no distress Heart: regular rate and rhythm, S1, S2 normal, no murmur, click, rub or gallop Lungs: clear to auscultation bilaterally Abdomen: soft, non-tender; bowel sounds normal; no masses,  no organomegaly Extremities: right lower leg edema and erythema Wound: sternal incision c/d/i. Right EVH site is red/edematous   Lab Results: Recent Labs    07/14/18 2242 07/15/18 0332  WBC 12.3* 12.8*  HGB 11.3* 11.0*  HCT 35.5* 34.8*  PLT 531* 553*   BMET:  Recent Labs    07/14/18 2242 07/15/18 0332  NA 137 136  K 5.0 4.5  CL 100 101  CO2 25 25  GLUCOSE 103* 104*  BUN 28* 25*  CREATININE 1.36* 1.22  CALCIUM 9.1 8.9    PT/INR: No results for input(s): LABPROT, INR in the last 72 hours. ABG    Component Value Date/Time   PHART 7.343 (L) 07/02/2018 0447   HCO3 20.9 07/02/2018 0447   TCO2 28 07/03/2018 2022   ACIDBASEDEF 4.0 (H) 07/02/2018 0447   O2SAT 57.0 07/04/2018 1000   CBG (last 3)  No results for input(s): GLUCAP in the last 72 hours.  Assessment/Plan:  1. RLE cellulitis-consult placed to pharmacy for IV abx treatment. Started on Vancomycin and Zosyn. WBC 12.8, afebrile.  2. Remains in NSR with stable BP. Continue  Amio, Xarelto for small bilateral PE, ASA, and statin 3. Tolerating room air with good oxygen saturation 4. Creatinine 1.22, electrolytes okay 5. H and H stable, 11.0/34.8  Plan: continue IV antibiotics per pharmacy. Please paint sternal incision with betadine daily. Keep leg incisions clean. Encouraged patient to keep legs elevated when possible to assist with his lower extremity edema.    LOS: 1 day    Elgie Collard 07/15/2018    Chart reviewed, patient examined, agree with above. He has minimal cellulitis of RLE this am. There is some edema in the leg and foot but he says it is much better. Hopefully a couple day of IV antibiotics and he can be transitioned to oral antibiotic as outpatient. The vein harvest incision has dry eschar. No sign of abscess. Chest incision intact with slight serous drainage at one spot in mid to lower portion. Clean with betadine and keep covered.

## 2018-07-15 NOTE — H&P (Signed)
  Signed         HPI: Patient is S/P CABG x 4 on 07/01/2018.  He was discharged home from the hospital on 07/07/2018.  He initially did okay.  However, he contacted the office after hours answering service on Saturday 07/11/2018 with complaints of increased swelling, redness, burning and discomfort in his RLE.  He stated this had progressed significantly since hospital discharge.  He states the leg originally was draining bloody fluid in the hospital but this had now become clear.  He denies any fever or purulence noted from his [facility] site.        Current Outpatient Medications  Medication Sig Dispense Refill  . amiodarone (PACERONE) 200 MG tablet Take 1 tablet (200 mg total) by mouth 2 (two) times daily. X 7 days, then decrease to 200 mg daily 60 tablet 0  . aspirin EC 81 MG EC tablet Take 1 tablet (81 mg total) by mouth daily.    Marland Kitchen atorvastatin (LIPITOR) 80 MG tablet Take 1 tablet (80 mg total) by mouth daily at 6 PM. 30 tablet 0  . furosemide (LASIX) 40 MG tablet Take 1 tablet (40 mg total) by mouth daily. X 3 days, then take on a daily basis as needed for weight gain of 3 lbs 30 tablet 0  . losartan (COZAAR) 100 MG tablet Take 1 tablet (100 mg total) by mouth daily. 30 tablet 0  . metoprolol succinate (TOPROL-XL) 50 MG 24 hr tablet Take 1 tablet (50 mg total) by mouth daily. Take with or immediately following a meal. 30 tablet 0  . potassium chloride SA (K-DUR,KLOR-CON) 20 MEQ tablet Take 1 tablet (20 mEq total) by mouth daily. X 3 days, then only on days you take Lasix 30 tablet 0  . Rivaroxaban 15 & 20 MG TBPK Take as directed on package: Start with one 15mg  tablet by mouth twice a day with food. On Day 22, switch to one 20mg  tablet once a day with food. 51 each 0  . traMADol (ULTRAM) 50 MG tablet Take 1 tablet (50 mg total) by mouth every 6 (six) hours as needed for moderate pain. 28 tablet 0   No current facility-administered medications for this visit.     Physical Exam:  BP  110/70 (BP Location: Left Arm, Patient Position: Sitting, Cuff Size: Normal)   Pulse 83   Resp 18   Ht 5\' 4"  (1.626 m)   Wt 150 lb 12.8 oz (68.4 kg)   SpO2 96% Comment: RA  BMI 25.88 kg/m   Gen: no apparent distress Heart: RRR Lungs: CTA bilaterally Ext: RLE very erythematous with extension into foot, eschar present on EVH site, no purulence noted, marked edema Sternotomy: C/D/I  A/P"  1. RLE cellulitis- unfortunately I do not think this can be treated with oral ABX.  Dr. Roxan Hockey examined patient and agreed.  We will admit to the hospital for a few days of IV ABX   Ellwood Handler, PA-C [facility] [phone]   Chart reviewed, patient examined, agree with above. He has had significant swelling in the RLE and developed celllulits around the vein harvest incision extending down the leg to the dorsum of the foot. This requires admission for Iv antibiotics.

## 2018-07-16 MED ORDER — VANCOMYCIN HCL IN DEXTROSE 1-5 GM/200ML-% IV SOLN
1000.0000 mg | INTRAVENOUS | Status: DC
  Administered 2018-07-16 – 2018-07-17 (×2): 1000 mg via INTRAVENOUS
  Filled 2018-07-16 (×3): qty 200

## 2018-07-16 NOTE — Progress Notes (Addendum)
      [address]       [place] [address]             [phone]            Subjective: Patient states RLE slightly less swollen than before. No specific complaints this am.  Objective: Vital signs in last 24 hours: Temp:  [97.8 F (36.6 C)-99 F (37.2 C)] 97.8 F (36.6 C) (01/02 0407) Pulse Rate:  [65-71] 71 (01/02 0407) Cardiac Rhythm: Normal sinus rhythm (01/01 1901) Resp:  [18-25] 22 (01/02 0407) BP: (120-140)/(77-79) 140/79 (01/02 0407) SpO2:  [95 %-99 %] 95 % (01/02 0407) Weight:  [68.9 kg] 68.9 kg (01/02 0407)   Current Weight  07/16/18 68.9 kg       Intake/Output from previous day: 01/01 0701 - 01/02 0700 In: 402.7 [IV Piggyback:402.7] Out: -    Physical Exam:  Cardiovascular: RRR Pulmonary: Clear to auscultation bilaterally Abdomen: Soft, non tender, bowel sounds present. Extremities: Right lower extremity edema.  Wounds: Sternal wound is clean and dry.  No erythema, drainage, or signs of infection. RLE wound slight erythema that goes down the lower leg  Lab Results: CBC: Recent Labs    07/14/18 2242 07/15/18 0332  WBC 12.3* 12.8*  HGB 11.3* 11.0*  HCT 35.5* 34.8*  PLT 531* 553*   BMET:  Recent Labs    07/14/18 2242 07/15/18 0332  NA 137 136  K 5.0 4.5  CL 100 101  CO2 25 25  GLUCOSE 103* 104*  BUN 28* 25*  CREATININE 1.36* 1.22  CALCIUM 9.1 8.9    PT/INR:  Lab Results  Component Value Date   INR 1.42 07/01/2018   INR 1.21 06/30/2018   INR 1.01 06/29/2018   ABG:  INR: Will add last result for INR, ABG once components are confirmed Will add last 4 CBG results once components are confirmed  Assessment/Plan:  1. 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 SR in the 60's. On Amiodarone 200 mg daily, Toprol XL 50 mg daily, Losartan 100 mg daily, Rivaroxaban XL 15 mg bid. 2.  Pulmonary - On room air. Encourage incentive spirometer. 3.  Acute blood loss anemia - H and H yesterday stable at 11 and 34.8 4. ID-on  Vancomycin and Zosyn for cellulitis RLE. WBC yesterday 12,800. Will recheck in am 5. Likely discharge on oral antibiotic in 1-2 days  Donielle M ZimmermanPA-C 07/16/2018,7:18 AM [phone]   Chart reviewed, patient examined, agree with above. He has minimal residual erythema. I would treat him with 5 days of IV antibiotics and send home probably on Doxycycline for a week and follow up with EBG.

## 2018-07-16 NOTE — Discharge Instructions (Addendum)
Discharge Instructions:  1. You may shower, please wash incisions daily with soap and water and keep dry.  If you wish to cover wounds with dressing you may do so but please keep clean and change daily.  No tub baths or swimming until incisions have completely healed.  If your incisions become red or develop any drainage please call our office at [phone]  2. No Driving until cleared by Dr. Everrett Coombe  office and you are no longer using narcotic pain medications  3. Monitor your weight daily.. Please use the same scale and weigh at same time... If you gain 5-10 lbs in 48 hours with associated lower extremity swelling, please contact our office at [phone]  4. Fever of 101.5 for at least 24 hours with no source, please contact our office at [phone]  5. Activity- up as tolerated, please walk at least 3 times per day.  Avoid strenuous activity, no lifting, pushing, or pulling with your arms over 8-10 lbs for a minimum of 6 weeks  6. If any questions or concerns arise, please do not hesitate to contact our office at [phone]  Information on my medicine - XARELTO (rivaroxaban)  This medication education was reviewed with me or my healthcare representative as part of my discharge preparation. WHY WAS XARELTO PRESCRIBED FOR YOU? Xarelto was prescribed to treat blood clots that may have been found in the veins of your legs (deep vein thrombosis) or in your lungs (pulmonary embolism) and to reduce the risk of them occurring again.  What do you need to know about Xarelto? The starting dose is one 15 mg tablet taken TWICE daily with food for the FIRST 21 DAYS then on 07/25/18 the dose is changed to one 20 mg tablet taken ONCE A DAY with your evening meal.  DO NOT stop taking Xarelto without talking to the health care provider who prescribed the medication.  Refill your prescription for 20 mg tablets before you run out.  After discharge, you should have regular check-up appointments  with your healthcare provider that is prescribing your Xarelto.  In the future your dose may need to be changed if your kidney function changes by a significant amount.  What do you do if you miss a dose? If you are taking Xarelto TWICE DAILY and you miss a dose, take it as soon as you remember. You may take two 15 mg tablets (total 30 mg) at the same time then resume your regularly scheduled 15 mg twice daily the next day.  If you are taking Xarelto ONCE DAILY and you miss a dose, take it as soon as you remember on the same day then continue your regularly scheduled once daily regimen the next day. Do not take two doses of Xarelto at the same time.   Important Safety Information Xarelto is a blood thinner medicine that can cause bleeding. You should call your healthcare provider right away if you experience any of the following: ? Bleeding from an injury or your nose that does not stop. ? Unusual colored urine (red or dark brown) or unusual colored stools (red or black). ? Unusual bruising for unknown reasons. ? A serious fall or if you hit your head (even if there is no bleeding).  Some medicines may interact with Xarelto and might increase your risk of bleeding while on Xarelto. To help avoid this, consult your healthcare provider or pharmacist prior to using any new prescription or non-prescription medications, including herbals, vitamins, non-steroidal anti-inflammatory drugs (NSAIDs) and supplements.  This website has more information on Xarelto: [email or web address]  Discharge Instructions:  1. You may shower, please wash incisions daily with soap and water and keep dry.  If you wish to cover wounds with dressing you may do so but please keep clean and change daily.  No tub baths or swimming until incisions have completely healed.  If your incisions become red or develop any drainage please call our office at [phone]  2. No Driving until cleared by Dr. Everrett Coombe office and you  are no longer using narcotic pain medications  3. Monitor your weight daily.. Please use the same scale and weigh at same time... If you gain 5-10 lbs in 48 hours with associated lower extremity swelling, please contact our office at [phone]  4. Fever of 101.5 for at least 24 hours with no source, please contact our office at [phone]  5. Activity- up as tolerated, please walk at least 3 times per day.  Avoid strenuous activity, no lifting, pushing, or pulling with your arms over 8-10 lbs for a minimum of 6 weeks  6. If any questions or concerns arise, please do not hesitate to contact our office at [phone]  7. Keep right lower leg elevated when not ambulating

## 2018-07-16 NOTE — Discharge Summary (Signed)
Physician Discharge Summary       [address]       [place] [address]             [phone]    Patient ID: Randy [name] MRN: [id number] DOB/AGE: 07/18/1945 73 y.o.  Admit date: 07/14/2018 Discharge date: 07/18/2018  Admission Diagnoses: Cellulitis of right leg  Discharge Diagnoses:  1. History of NSTEMI 2. History of CABG x 4 3. History of tobacco abuse 4. History of hyperlipidemia 5. History of hypertension 6. History of PE  History of Presenting Illness: Randy [name] is well known to [facility]. He is S/P CABG x 4 on 07/01/2018. He was discharged home from the hospital on 07/07/2018. He initially did okay. However, he contacted the office after hours answering service on Saturday 07/11/2018 with complaints of increased swelling, redness, burning and discomfort in his RLE. He stated this had progressed significantly since hospital discharge. He states the leg originally was draining bloody fluid in the hospital but this had now become clear. He denies any fever or purulence noted from his [facility] site.  He was evaluated in the office and it was felt admission was warranted.  Brief Hospital Course:  He has been on Vancomycin and Zosyn for cellulitis of the RLE. Culture showed Staph Aureus. WBC yesterday 13,000. Swelling and erythema of the RLE have improved since admission. Per surgeon, he will be discharged on oral Doxycycline 100 mg bid. He remains in sinus rhythm. He has been tolerating a diet. Sternal wound is clean and dry. He is felt surgically stable for discharge today.  Latest Vital Signs: Blood pressure 134/82, pulse 62, temperature 98.5 F (36.9 C), temperature source Oral, resp. rate (!) 26, height 5\' 4"  (1.626 m), weight 66.9 kg, SpO2 100 %.  Physical Exam: Cardiovascular: RRR Pulmonary: Clear to auscultation bilaterally Abdomen: Soft, non tender, bowel sounds present. Extremities: Mild right lower extremity edema but continues to decrease Wounds:  Sternal wound is clean and dry.  No erythema, drainage, or signs of infection. RLE wound with eschar but no erythema or drainage.   Discharge Condition: Stable and discharged to home.  Recent laboratory studies:  Lab Results  Component Value Date   WBC 13.0 (H) 07/17/2018   HGB 10.3 (L) 07/17/2018   HCT 32.8 (L) 07/17/2018   MCV 92.1 07/17/2018   PLT 515 (H) 07/17/2018   Lab Results  Component Value Date   NA 136 07/18/2018   K 4.2 07/18/2018   CL 106 07/18/2018   CO2 21 (L) 07/18/2018   CREATININE 1.15 07/18/2018   GLUCOSE 100 (H) 07/18/2018    Diagnostic Studies: Dg Chest 2 View  Result Date: 07/06/2018 CLINICAL DATA:  Chest pain.  Recent coronary artery bypass grafting EXAM: CHEST - 2 VIEW COMPARISON:  July 05, 2018 FINDINGS: There is a small left pleural effusion. There is left base atelectasis. There is also mild right perihilar atelectasis. There is no frank edema or consolidation. Heart is mildly enlarged with pulmonary vascularity normal. No adenopathy. No pneumothorax. There is stable anterior wedging of a midthoracic vertebral body. IMPRESSION: Small left pleural effusion with left lower lobe atelectatic change. Mild right perihilar region atelectasis. No consolidation. Stable cardiac prominence. No pneumothorax evident. Electronically Signed   By: Lowella Grip III M.D.   On: 07/06/2018 07:08    Ct Angio Chest Pe W Or Wo Contrast  Result Date: 06/29/2018 CLINICAL DATA:  Chest pain and shortness of breath since 7 a.m. this morning. EXAM: CT ANGIOGRAPHY  CHEST WITH CONTRAST TECHNIQUE: Multidetector CT imaging of the chest was performed using the standard protocol during bolus administration of intravenous contrast. Multiplanar CT image reconstructions and MIPs were obtained to evaluate the vascular anatomy. CONTRAST:  <See Chart> ISOVUE-370 IOPAMIDOL (ISOVUE-370) INJECTION 76% COMPARISON:  None. FINDINGS: Cardiovascular: The heart is mildly enlarged but stable. No  pericardial effusion. The aorta is normal in caliber. Moderate atherosclerotic calcifications. Scattered coronary artery calcifications. The pulmonary arterial tree is fairly well opacified. There are small bilateral filling defects consistent with pulmonary embolism. No right heart strain. Mediastinum/Nodes: Scattered mediastinal and hilar lymph nodes. The esophagus is grossly normal. Lungs/Pleura: Moderate breathing motion artifact. No worrisome pulmonary lesions. There are small bilateral pleural effusions with vascular congestion and mild pulmonary edema. No infiltrates. No pulmonary lesions. Upper Abdomen: No significant upper abdominal findings. Musculoskeletal: No chest wall mass, supraclavicular or axillary adenopathy. No significant bony findings. There are mild compression deformities involving T7 and Y60 of uncertain age. Review of the MIP images confirms the above findings. IMPRESSION: 1. Small bilateral pulmonary emboli. 2. Mild cardiac enlargement, vascular congestion and mild pulmonary edema along with small pleural effusions. 3. Atherosclerotic calcifications involving the thoracic aorta but no aneurysm. 4. No mediastinal or hilar mass or adenopathy. Aortic Atherosclerosis (ICD10-I70.0). Electronically Signed   By: Marijo Sanes M.D.   On: 06/29/2018 15:20   Vas US Doppler Pre Cabg  Result Date: 06/30/2018 PREOPERATIVE VASCULAR EVALUATION  Indications:      Pre-CABG evaluation. Risk Factors:     Hypertension. Other Factors:    History of EtoH and tobacco use. Limitations:      Patient is on balloon-pump Comparison Study: No prior study available. Performing Technologist: Landry Mellow, Hong[place] RDMS RVT  Examination Guidelines: A complete evaluation includes B-mode imaging, spectral Doppler, color Doppler, and power Doppler as needed of all accessible portions of each vessel. Bilateral testing is considered an integral part of a complete examination. Limited examinations for reoccurring indications may  be performed as noted.  Right Carotid Findings: +----------+--------+--------+--------+-------------------------------+--------+           PSV cm/sEDV cm/sStenosisDescribe                       Comments +----------+--------+--------+--------+-------------------------------+--------+ CCA Prox                                                         patent   +----------+--------+--------+--------+-------------------------------+--------+ ICA Prox                          focal, hyperechoic and calcificpatent   +----------+--------+--------+--------+-------------------------------+--------+ ICA Mid                                                          patent   +----------+--------+--------+--------+-------------------------------+--------+ ICA Distal                                                       patent   +----------+--------+--------+--------+-------------------------------+--------+  ECA                                                              patent   +----------+--------+--------+--------+-------------------------------+--------+ Portions of this table do not appear on this page. +----------+--------+-------+-------------------+------------+           PSV cm/sEDV cmsDescribe           Arm Pressure +----------+--------+-------+-------------------+------------+ Subclavian               focal calcification             +----------+--------+-------+-------------------+------------+ +---------+--------+--------+------+ VertebralPSV cm/sEDV cm/spatent +---------+--------+--------+------+ Left Carotid Findings: +----------+--------+--------+--------+------------------------+--------+           PSV cm/sEDV cm/sStenosisDescribe                Comments +----------+--------+--------+--------+------------------------+--------+ CCA Prox                                                  patent    +----------+--------+--------+--------+------------------------+--------+ ICA Prox                          diffuse and heterogenouspatent   +----------+--------+--------+--------+------------------------+--------+ ICA Mid                                                   patent   +----------+--------+--------+--------+------------------------+--------+ ICA Distal                                                patent   +----------+--------+--------+--------+------------------------+--------+ ECA                                                       patent   +----------+--------+--------+--------+------------------------+--------+ +---------+--------+--------+------+ VertebralPSV cm/sEDV cm/spatent +---------+--------+--------+------+  ABI Findings: ABI unable to obtain due to patient has bilateral PE per CTA on 06/29/2018, patient is on balloon pump during the exam, waveforms are not accurate.  Summary: Right Carotid: ICA is patent. Left Carotid: ICA is patent. Vertebrals: Bilateral vertebral arteries demonstrate antegrade flow. Right Upper Extremity: Doppler waveforms remain within normal limits with right radial compression. Doppler waveforms remain within normal limits with right ulnar compression. Left Upper Extremity: Doppler waveforms remain within normal limits with left radial compression. Doppler waveforms decrease 50% with left ulnar compression.  Electronically signed by Deitra Mayo MD on 06/30/2018 at 4:35:45 PM.    Final      Discharge Medications: Allergies as of 07/18/2018   No Known Allergies     Medication List    TAKE these medications   amiodarone 200 MG tablet Commonly known as:  PACERONE Take 1 tablet (200 mg total) by mouth daily. X 7 days, then decrease to 200 mg daily What  changed:  when to take this   aspirin 81 MG EC tablet Take 1 tablet (81 mg total) by mouth daily.   atorvastatin 80 MG tablet Commonly known as:  LIPITOR Take 1  tablet (80 mg total) by mouth daily at 6 PM.   doxycycline 50 MG capsule Commonly known as:  VIBRAMYCIN Take 100 mg by mouth two times daily   furosemide 40 MG tablet Commonly known as:  LASIX Take 1 tablet (40 mg total) by mouth daily. Take on a daily basis as needed for weight gain of 3 lbs What changed:  additional instructions   losartan 100 MG tablet Commonly known as:  COZAAR Take 1 tablet (100 mg total) by mouth daily.   metoprolol succinate 50 MG 24 hr tablet Commonly known as:  TOPROL-XL Take 1 tablet (50 mg total) by mouth daily. Take with or immediately following a meal.   potassium chloride SA 20 MEQ tablet Commonly known as:  K-DUR,KLOR-CON Take 1 tablet (20 mEq total) by mouth daily. Only take on days you take Lasix What changed:  additional instructions   Rivaroxaban 15 & 20 MG Tbpk Take 15-20 mg by mouth See admin instructions. Take as directed on package: Start with one 15mg  tablet by mouth twice a day with food. On Day 22, switch to one 20mg  tablet once a day with food.   traMADol 50 MG tablet Commonly known as:  ULTRAM Take 50 mg by mouth 2 (two) times daily. MORNING and EVENING       Follow Up Appointments: Follow-up Information    Grace Isaac, MD. Go on 07/30/2018.   Specialty:  Cardiothoracic Surgery Why:  PA/LAT CXR to be taken (at [facility] which is in the same building as Dr. Everrett Coombe office) on 07/30/2018 at 3:30 pm;Appointment time is at 4:00 pm Contact information: [place] [address] [phone]        Barrett, Evelene Croon, PA-C. Go on 07/20/2018.   Specialties:  Cardiology, Radiology Why:  Appointment time is at 10:30 am Contact information: [place] [address] [phone]           Signed: Sharalyn Ink [facility] 07/18/2018, 9:38 AM

## 2018-07-16 NOTE — Progress Notes (Signed)
Pharmacy Antibiotic Note  Randy [name] is a 73 y.o. male admitted on 07/14/2018 with complaints of increased swelling, redness, burning and discomfort in his RLE.  Pharmacy has been consulted for vancomycin and zosyn dosing - today is Day #3 of antibiotics.   SCr up to 1.22 yesterday, UOP not charted. WBC elevated to 12.8, afebrile.  Plan: Zosyn 3.375g IV q8h Change Vancomycin to 1000mg  q24h. Goal AUC 400-550. Expected AUC: 468 SCr used: 1.22 Noted plan to d/c home in 1-2 days on oral abx so likely no levels needed   Temp (24hrs), Avg:98.5 F (36.9 C), Min:97.8 F (36.6 C), Max:99.1 F (37.3 C)  Recent Labs  Lab 07/14/18 2242 07/15/18 0332  WBC 12.3* 12.8*  CREATININE 1.36* 1.22    Estimated Creatinine Clearance: 46.5 mL/min (by C-G formula based on SCr of 1.22 mg/dL).    No Known Allergies   Thank you for allowing pharmacy to be a part of this patient's care.  Sherlon Handing, PharmD, BCPS Clinical pharmacist  **Pharmacist phone directory can now be found on [email or web address] ([email or web address]).  Listed under [place]. 07/16/2018 10:39 AM

## 2018-07-17 LAB — CBC
HCT: 32.8 % — ABNORMAL LOW (ref 39.0–52.0)
Hemoglobin: 10.3 g/dL — ABNORMAL LOW (ref 13.0–17.0)
MCH: 28.9 pg (ref 26.0–34.0)
MCHC: 31.4 g/dL (ref 30.0–36.0)
MCV: 92.1 fL (ref 80.0–100.0)
Platelets: 515 10*3/uL — ABNORMAL HIGH (ref 150–400)
RBC: 3.56 MIL/uL — ABNORMAL LOW (ref 4.22–5.81)
RDW: 14 % (ref 11.5–15.5)
WBC: 13 10*3/uL — ABNORMAL HIGH (ref 4.0–10.5)
nRBC: 0 % (ref 0.0–0.2)

## 2018-07-17 NOTE — Care Management Important Message (Signed)
Important Message  Patient Details  Name: Randy [name] MRN: [id number] Date of Birth: September 11, 1945   Medicare Important Message Given:  Yes    Barb Merino [place] 07/17/2018, 4:51 PM

## 2018-07-17 NOTE — Progress Notes (Addendum)
      [address]       [place] [address]             [phone]            Subjective: Patient has no specific complaints this am.  Objective: Vital signs in last 24 hours: Temp:  [97.9 F (36.6 C)-99.1 F (37.3 C)] 97.9 F (36.6 C) (01/03 0416) Pulse Rate:  [65-78] 69 (01/03 0420) Cardiac Rhythm: Normal sinus rhythm (01/02 1910) Resp:  [19-27] 27 (01/03 0420) BP: (123-129)/(75-87) 128/80 (01/03 0416) SpO2:  [94 %-98 %] 94 % (01/03 0420) Weight:  [66.9 kg] 66.9 kg (01/03 0420)   Current Weight  07/17/18 66.9 kg      Intake/Output from previous day: 01/02 0701 - 01/03 0700 In: 338.1 [IV Piggyback:338.1] Out: -    Physical Exam:  Cardiovascular: RRR Pulmonary: Clear to auscultation bilaterally Abdomen: Soft, non tender, bowel sounds present. Extremities: Right lower extremity edema.  Wounds: Sternal wound is clean and dry.  No erythema, drainage, or signs of infection. RLE wound slight erythema around skin edges of wound but no erythema distally.  Lab Results: CBC: Recent Labs    07/15/18 0332 07/17/18 0316  WBC 12.8* 13.0*  HGB 11.0* 10.3*  HCT 34.8* 32.8*  PLT 553* 515*   BMET:  Recent Labs    07/14/18 2242 07/15/18 0332  NA 137 136  K 5.0 4.5  CL 100 101  CO2 25 25  GLUCOSE 103* 104*  BUN 28* 25*  CREATININE 1.36* 1.22  CALCIUM 9.1 8.9    PT/INR:  Lab Results  Component Value Date   INR 1.42 07/01/2018   INR 1.21 06/30/2018   INR 1.01 06/29/2018   ABG:  INR: Will add last result for INR, ABG once components are confirmed Will add last 4 CBG results once components are confirmed  Assessment/Plan:  1. 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 SR in the 60's. On Amiodarone 200 mg daily, Toprol XL 50 mg daily, Losartan 100 mg daily, Rivaroxaban XL 15 mg bid. 2.  Pulmonary - On room air. Encourage incentive spirometer. 3.  Acute blood loss anemia - H and H yesterday stable at 10.3 and 32.8 4. ID-on Vancomycin and  Zosyn for cellulitis RLE. WBC yesterday 13,000 5. Per surgeon, will need 5 days of IV antibiotics followed by Doxycycline  Donielle M ZimmermanPA-C 07/17/2018,7:11 AM [phone]   Chart reviewed, patient examined, agree with above. His right leg looks much better. I think he could go home on oral Doxycycline tomorrow if no change.

## 2018-07-18 LAB — BASIC METABOLIC PANEL
Anion gap: 9 (ref 5–15)
BUN: 18 mg/dL (ref 8–23)
CALCIUM: 8.6 mg/dL — AB (ref 8.9–10.3)
CO2: 21 mmol/L — ABNORMAL LOW (ref 22–32)
Chloride: 106 mmol/L (ref 98–111)
Creatinine, Ser: 1.15 mg/dL (ref 0.61–1.24)
GFR calc non Af Amer: >60 mL/min (ref >60)
Glucose, Bld: 100 mg/dL — ABNORMAL HIGH (ref 70–99)
Potassium: 4.2 mmol/L (ref 3.5–5.1)
Sodium: 136 mmol/L (ref 135–145)

## 2018-07-18 MED ORDER — FUROSEMIDE 40 MG PO TABS: 40.0000 mg | ORAL_TABLET | Freq: Every day | ORAL | 0 refills | Status: DC

## 2018-07-18 MED ORDER — POTASSIUM CHLORIDE CRYS ER 20 MEQ PO TBCR: 20.0000 meq | EXTENDED_RELEASE_TABLET | Freq: Every day | ORAL | 0 refills | Status: DC

## 2018-07-18 MED ORDER — DOXYCYCLINE HYCLATE 50 MG PO CAPS: ORAL_CAPSULE | ORAL | 0 refills | Status: DC

## 2018-07-18 MED ORDER — AMIODARONE HCL 200 MG PO TABS: 200.0000 mg | ORAL_TABLET | Freq: Every day | ORAL | 0 refills | Status: DC

## 2018-07-18 NOTE — Progress Notes (Addendum)
      [address]       [place] [address]             [phone]            Subjective: Patient has no specific complaints again this am.  Objective: Vital signs in last 24 hours: Temp:  [97.9 F (36.6 C)-98.5 F (36.9 C)] 98.5 F (36.9 C) (01/04 0008) Pulse Rate:  [62] 62 (01/03 2006) Cardiac Rhythm: Normal sinus rhythm (01/04 0700) Resp:  [16-26] 26 (01/04 0008) BP: (113-134)/(74-82) 134/82 (01/04 0008) SpO2:  [98 %-100 %] 100 % (01/04 0008)   Current Weight  07/17/18 66.9 kg      Intake/Output from previous day: 01/03 0701 - 01/04 0700 In: 260 [P.O.:260] Out: -    Physical Exam:  Cardiovascular: RRR Pulmonary: Clear to auscultation bilaterally Abdomen: Soft, non tender, bowel sounds present. Extremities: Mild right lower extremity edema but continues to decrease Wounds: Sternal wound is clean and dry.  No erythema, drainage, or signs of infection. RLE wound with eschar but no erythema or drainage.  Lab Results: CBC: Recent Labs    07/17/18 0316  WBC 13.0*  HGB 10.3*  HCT 32.8*  PLT 515*   BMET:  Recent Labs    07/18/18 0315  NA 136  K 4.2  CL 106  CO2 21*  GLUCOSE 100*  BUN 18  CREATININE 1.15  CALCIUM 8.6*    PT/INR:  Lab Results  Component Value Date   INR 1.42 07/01/2018   INR 1.21 06/30/2018   INR 1.01 06/29/2018   ABG:  INR: Will add last result for INR, ABG once components are confirmed Will add last 4 CBG results once components are confirmed  Assessment/Plan:  1. 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 Maintaining SR in the 60's. On Amiodarone 200 mg daily, Toprol XL 50 mg daily, Losartan 100 mg daily, Rivaroxaban XL 15 mg bid. 2.  Pulmonary - On room air. Encourage incentive spirometer. 3.  Acute blood loss anemia - H and H yesterday stable at 10.3 and 32.8 4. ID-on Vancomycin and Zosyn for cellulitis RLE. WBC yesterday 13,000 5. Hope to discharge later today on Doxycycline  Randy M  [name] 07/18/2018,7:16 AM [phone]  Cellulitis of leg incision has responded to IV antibiotics and patient can be safely discharged home and transition to oral antibiotic-doxycycline, with outpatient follow-up.  patient examined and medical record reviewed,agree with above note. Randy [name] 07/18/2018

## 2018-07-20 ENCOUNTER — Encounter: Payer: Self-pay | Admitting: Physician Assistant

## 2018-07-20 ENCOUNTER — Ambulatory Visit: Payer: Medicare PPO | Admitting: Physician Assistant

## 2018-07-20 VITALS — BP 146/87 | HR 63 | Ht 64.0 in | Wt 153.0 lb

## 2018-07-20 DIAGNOSIS — I4891 Unspecified atrial fibrillation: Secondary | ICD-10-CM | POA: Insufficient documentation

## 2018-07-20 DIAGNOSIS — L03115 Cellulitis of right lower limb: Secondary | ICD-10-CM

## 2018-07-20 DIAGNOSIS — I214 Non-ST elevation (NSTEMI) myocardial infarction: Secondary | ICD-10-CM

## 2018-07-20 DIAGNOSIS — I480 Paroxysmal atrial fibrillation: Secondary | ICD-10-CM

## 2018-07-20 DIAGNOSIS — Z951 Presence of aortocoronary bypass graft: Secondary | ICD-10-CM | POA: Diagnosis not present

## 2018-07-20 DIAGNOSIS — E785 Hyperlipidemia, unspecified: Secondary | ICD-10-CM

## 2018-07-20 DIAGNOSIS — I9789 Other postprocedural complications and disorders of the circulatory system, not elsewhere classified: Secondary | ICD-10-CM

## 2018-07-20 DIAGNOSIS — I10 Essential (primary) hypertension: Secondary | ICD-10-CM | POA: Insufficient documentation

## 2018-07-20 MED ORDER — RIVAROXABAN (XARELTO) VTE STARTER PACK (15 & 20 MG): ORAL_TABLET | ORAL | 0 refills | Status: DC

## 2018-07-20 NOTE — Patient Instructions (Signed)
Medication Instructions:  Your physician has recommended you make the following change in your medication: RESTART Xarelto 15mg  twice daily for 2 weeks. Then START Xarelto 20mg  daily.  If you need a refill on your cardiac medications before your next appointment, please call your pharmacy.   Lab work: Your physician recommends that you return for a FASTING lipid profile and cmet in 4-6 weeks  If you have labs (blood work) drawn today and your tests are completely normal, you will receive your results only by: Marland Kitchen MyChart Message (if you have MyChart) OR . A paper copy in the mail If you have any lab test that is abnormal or we need to change your treatment, we will call you to review the results.  Testing/Procedures: None ordered  Follow-Up: At [facility], you and your health needs are our priority.  As part of our continuing mission to provide you with exceptional heart care, we have created designated Provider Care Teams.  These Care Teams include your primary Cardiologist (physician) and Advanced Practice Providers (APPs -  Physician Assistants and Nurse Practitioners) who all work together to provide you with the care you need, when you need it. You will need a follow up appointment in 3 months with Dr.Christopher.    Any Other Special Instructions Will Be Listed Below (If Applicable). Continue to increase activity gradually. Follow the surgeons instructions on activity limitations.

## 2018-07-20 NOTE — Progress Notes (Signed)
Cardiology Office Note   Date:  07/20/2018   ID:  Randy [name], DOB 1946-02-10, MRN [id number]  PCP:  System, Pcp Not In Cardiologist:  Buford Dresser, MD 07/03/2018 in-hospital Rosaria Ferries, PA-C   Chief Complaint  Patient presents with  . Follow-up    Pt states no Sx.    History of Present Illness: Randy [name] is a 73 y.o. male with a history of HTN (not treated), tob use, remote ETOH abuse  Admitted 12/16-12/24/2019 with a non-STEMI, small bilat PE, IABP needed, s/p CABG w/ LIMA-LAD, SVG-Diag, SVG-OM, SVG-PLVB.  Extubated and weaned off milrinone, dopamine and balloon pump on POD #1.  A. fib treated with amiodarone, sinus rhythm at discharge, started on Xarelto Admitted 07/14/2018-07/18/2018 for RLE cellulitis, S. Aureus on cx, d/c on doxycycline  Randy [name] presents for cardiology follow up.  He has done well since d/c 01/04. RLE swelling from the cellulitis is better and the erythema is improving. No fevers since d/c.   He has been walking at his mother's retirement home. Is working on increasing that.  That has been a good option for him because it is flat and climate controlled.  No PND or orthopnea. He was having both pta. He is sleeping better.   No chest pain not from the incision.  Has not really needed the tramadol, but still has some.  No tobacco since d/c.  His family is helping to care for him and none of them smoke.  No palpitations since d/c, did not really have any in the hospital. Does not remember any pta, was struggling more with SOB.   He has not taken Xarelto since his second discharge from the hospital, it was listed on his medication list, but was not marked as something he should take.   Past Medical History:  Diagnosis Date  . Alcohol abuse    Last drink was 10 years ago (2009).  Marland Kitchen Hypertension   . NSTEMI (non-ST elevated myocardial infarction) ([place]) 06/29/2018   s/p CABG w/ LIMA-LAD, SVG-diagonal, SVG-OM, SVG-PLV  . PAF (paroxysmal  atrial fibrillation) ([place]) 06/2018   Postop CABG  . Pulmonary embolus ([place]) 06/29/2018  . Tobacco abuse     Past Surgical History:  Procedure Laterality Date  . CORONARY ARTERY BYPASS GRAFT N/A 07/01/2018   Procedure: CORONARY ARTERY BYPASS GRAFTING (CABG) x 4, LIMA TO LAD, SVG TO OM1, SVG TO DIAG, SVG TO PL, USING LEFT INTERNAL MAMMARY ARTERY AND RIGHT GREATER SAPHENOUS VEIN HARVESTED ENDOSCOPICALLY;  Surgeon: Grace Isaac, MD;  Location: [place];  Service: Open Heart Surgery;  Laterality: N/A;  . IABP INSERTION Right 06/29/2018   Procedure: IABP Insertion;  Surgeon: Sherren Mocha, MD;  Location: [place] [facility];  Service: Cardiovascular;  Laterality: Right;  . LEFT HEART CATH AND CORONARY ANGIOGRAPHY N/A 06/29/2018   Procedure: LEFT HEART CATH AND CORONARY ANGIOGRAPHY;  Surgeon: Sherren Mocha, MD;  Location: [place] [facility];  Service: Cardiovascular;  Laterality: N/A;  . TEE WITHOUT CARDIOVERSION N/A 07/01/2018   Procedure: TRANSESOPHAGEAL ECHOCARDIOGRAM (TEE);  Surgeon: Grace Isaac, MD;  Location: [place];  Service: Open Heart Surgery;  Laterality: N/A;    Current Outpatient Medications  Medication Sig Dispense Refill  . amiodarone (PACERONE) 200 MG tablet Take 1 tablet (200 mg total) by mouth daily. 60 tablet 0  . aspirin EC 81 MG EC tablet Take 1 tablet (81 mg total) by mouth daily.    Marland Kitchen atorvastatin (LIPITOR) 80 MG tablet Take 1 tablet (80  mg total) by mouth daily at 6 PM. 30 tablet 0  . doxycycline (VIBRAMYCIN) 50 MG capsule Take 100 mg by mouth two times daily 24 capsule 0  . losartan (COZAAR) 100 MG tablet Take 1 tablet (100 mg total) by mouth daily. 30 tablet 0  . metoprolol succinate (TOPROL-XL) 50 MG 24 hr tablet Take 1 tablet (50 mg total) by mouth daily. Take with or immediately following a meal. 30 tablet 0  . Rivaroxaban 15 & 20 MG TBPK Take as directed on package: Start with one 15mg  tablet by mouth twice a day with food. On Day 22, switch to one 20mg   tablet once a day with food. 51 each 0   No current facility-administered medications for this visit.     Allergies:   Patient has no known allergies.    Social History:  The patient  reports that he quit smoking about 3 weeks ago. His smoking use included cigarettes. He has a 25.00 pack-year smoking history. He has never used smokeless tobacco. He reports previous alcohol use. He reports that he does not use drugs.   Family History:  The patient's family history includes CAD in his father.  He indicated that his father is deceased. He indicated that the status of his neg hx is unknown.   ROS:  Please see the history of present illness. All other systems are reviewed and negative.    PHYSICAL EXAM: VS:  BP (!) 146/87   Pulse 63   Ht 5\' 4"  (1.626 m)   Wt 153 lb (69.4 kg)   BMI 26.26 kg/m  , BMI Body mass index is 26.26 kg/m. GEN: Well nourished, well developed, male in no acute distress HEENT: normal for age  Neck: no JVD, no carotid bruit, no masses Cardiac: RRR; soft murmur, no rubs, or gallops Respiratory:  clear to auscultation bilaterally, normal work of breathing GI: soft, nontender, nondistended, + BS MS: no deformity or atrophy; trace-1+ right lower extremity edema, none on the left; distal pulses are 2+ in all 4 extremities  Skin: warm and dry, no rash; all incisions are healing well, no signs of infection on chest or lower extremity incisions Neuro:  Strength and sensation are intact Psych: euthymic mood, full affect   EKG:  EKG is ordered today. The ekg ordered today demonstrates sinus rhythm, heart rate 63, lateral T wave inversions are consistent with recent MI  ECHO: TEE, 07/01/2018  Left atrium: Cavity is mildly to moderately dilated. No spontaneous echo contrast.  Mitral valve: Moderate regurgitation.  Right ventricle: Normal cavity size, wall thickness and ejection fraction.  Tricuspid valve: Trace regurgitation.   ECHO: 06/30/2018 - Left ventricle:  The cavity size was mildly dilated. There was   moderate concentric hypertrophy. Systolic function was mildly to   moderately reduced. The estimated ejection fraction was in the   range of 40% to 45%. Doppler parameters are consistent with   abnormal left ventricular relaxation (grade 1 diastolic   dysfunction). Doppler parameters are consistent with elevated   ventricular end-diastolic filling pressure. - Aortic valve: Trileaflet; mildly thickened, mildly calcified   leaflets. Transvalvular velocity was within the normal range.   There was no stenosis. - Mitral valve: Calcified annulus. Mildly thickened leaflets .   There was moderate regurgitation directed posteriorly. - Left atrium: The atrium was normal in size. - Right ventricle: Systolic function was normal. - Tricuspid valve: There was mild regurgitation. - Pulmonary arteries: Systolic pressure was within the normal   range. -  Inferior vena cava: The vessel was normal in size. - Pericardium, extracardiac: There was no pericardial effusion.  Impressions:  - LVEF 40-45% with akinesis in the basal and mid inferior,   inferolateral walls and in the apical lateral wall.   CATH: 06/29/2018  Mid RCA lesion is 100% stenosed.  Mid LM to Dist LM lesion is 30% stenosed.  Prox LAD lesion is 100% stenosed.  Ost 2nd Mrg lesion is 100% stenosed.  LV end diastolic pressure is moderately elevated.  There is moderate to severe left ventricular systolic dysfunction.   1.  Severe three-vessel coronary artery disease likely with recent occlusion of the RCA 2.  Total occlusion of the mid RCA, proximal LAD, and second obtuse marginal, all vessels supplied by collateral branches 3.  Moderately severe LV systolic dysfunction with LVEF estimated at 35% with akinesis of the anterolateral wall and basal inferior walls 4.  Diffuse aortoiliac disease without evidence of abdominal aortic aneurysm 5.  Successful placement of an intra-aortic  balloon pump via right femoral artery access  Recommendations: Once the patient is stabilized, I think he would likely benefit from surgical revascularization for treatment of his critical multivessel coronary artery disease.  A TCTS consult will be placed.  Recent Labs: 06/30/2018: ALT 27; B Natriuretic Peptide 1,369.9 07/04/2018: Magnesium 2.1; TSH 7.113 07/17/2018: Hemoglobin 10.3; Platelets 515 07/18/2018: BUN 18; Creatinine, Ser 1.15; Potassium 4.2; Sodium 136  CBC    Component Value Date/Time   WBC 13.0 (H) 07/17/2018 0316   RBC 3.56 (L) 07/17/2018 0316   HGB 10.3 (L) 07/17/2018 0316   HCT 32.8 (L) 07/17/2018 0316   PLT 515 (H) 07/17/2018 0316   MCV 92.1 07/17/2018 0316   MCH 28.9 07/17/2018 0316   MCHC 31.4 07/17/2018 0316   RDW 14.0 07/17/2018 0316   LYMPHSABS 1.2 06/29/2018 1035   MONOABS 1.1 (H) 06/29/2018 1035   EOSABS 0.0 06/29/2018 1035   BASOSABS 0.0 06/29/2018 1035   CMP Latest Ref Rng & Units 07/18/2018 07/15/2018 07/14/2018  Glucose 70 - 99 mg/dL 100(H) 104(H) 103(H)  BUN 8 - 23 mg/dL 18 25(H) 28(H)  Creatinine 0.61 - 1.24 mg/dL 1.15 1.22 1.36(H)  Sodium 135 - 145 mmol/L 136 136 137  Potassium 3.5 - 5.1 mmol/L 4.2 4.5 5.0  Chloride 98 - 111 mmol/L 106 101 100  CO2 22 - 32 mmol/L 21(L) 25 25  Calcium 8.9 - 10.3 mg/dL 8.6(L) 8.9 9.1  Total Protein 6.5 - 8.1 g/dL - - -  Total Bilirubin 0.3 - 1.2 mg/dL - - -  Alkaline Phos 38 - 126 U/L - - -  AST 15 - 41 U/L - - -  ALT 0 - 44 U/L - - -     Lipid Panel Lab Results  Component Value Date   CHOL 250 (H) 06/29/2018   HDL 69 06/29/2018   LDLCALC 169 (H) 06/29/2018   TRIG 62 06/29/2018   CHOLHDL 3.6 06/29/2018      Wt Readings from Last 3 Encounters:  07/20/18 153 lb (69.4 kg)  07/17/18 147 lb 7.8 oz (66.9 kg)  07/14/18 150 lb 12.8 oz (68.4 kg)     Other studies Reviewed: Additional studies/ records that were reviewed today include: Hospital records and testing.  ASSESSMENT AND PLAN:  1.  Non-STEMI s/p  CABG: He is on baby aspirin, not high dose because of the Xarelto Continue beta-blocker and high-dose statin. Continue increasing activity per cardiac rehab guidelines with surgical restrictions Follow-up with the surgical team as scheduled.  2.  PAF: Continue amiodarone at 200 mg a day for now. -Continue beta-blocker - Decide at follow-up with Dr. Harrell Gave if amiodarone should be a long-term medication  3.  Anticoagulation: CHA2DS2-VASc = 5 (age x 1, MI, PE x 2, HTN) -Due to a software issue, Xarelto was on the medication list but it was not marked as a medication for him to take so he did not. - Restart Xarelto at 15 mg twice daily for PE treatment and continue this for at least 2 weeks, then go to 20 mg daily -Decide at follow-up with Dr. Harrell Gave if Xarelto should be a long term medication   4.  Lower extremity cellulitis: This is greatly improved and he has taken the doxycycline as directed.  5.  Hyperlipidemia: Lipid profile as above, LDL was 169 -Continue high-dose statin, recheck lipids with complete metabolic profile in 6 weeks  Current medicines are reviewed at length with the patient today.  The patient has concerns regarding medicines.  The following changes have been made: Restart the Xarelto  Labs/ tests ordered today include:   Orders Placed This Encounter  Procedures  . Lipid panel  . Comprehensive Metabolic Panel (CMET)  . EKG 12-Lead     Disposition:   FU with Buford Dresser, MD  Signed, Rosaria Ferries, PA-C  07/20/2018 3:28 PM    [place] Phone: [phone]; Fax: [phone]

## 2018-07-29 ENCOUNTER — Other Ambulatory Visit: Payer: Self-pay | Admitting: Cardiothoracic Surgery

## 2018-07-29 DIAGNOSIS — Z951 Presence of aortocoronary bypass graft: Secondary | ICD-10-CM

## 2018-07-30 ENCOUNTER — Other Ambulatory Visit: Payer: Self-pay | Admitting: Cardiothoracic Surgery

## 2018-07-30 ENCOUNTER — Encounter: Payer: Self-pay | Admitting: Cardiothoracic Surgery

## 2018-07-30 ENCOUNTER — Ambulatory Visit
Admission: RE | Admit: 2018-07-30 | Discharge: 2018-07-30 | Disposition: A | Discharge status: Home / Self Care | Payer: Medicare PPO | Source: Ambulatory Visit | Attending: Cardiothoracic Surgery | Admitting: Cardiothoracic Surgery

## 2018-07-30 ENCOUNTER — Ambulatory Visit (INDEPENDENT_AMBULATORY_CARE_PROVIDER_SITE_OTHER): Payer: Self-pay | Admitting: Cardiothoracic Surgery

## 2018-07-30 VITALS — BP 146/88 | HR 76 | Resp 18 | Ht 64.0 in | Wt 143.7 lb

## 2018-07-30 DIAGNOSIS — Z951 Presence of aortocoronary bypass graft: Secondary | ICD-10-CM

## 2018-07-30 NOTE — Progress Notes (Signed)
[address]       [place] [address]             [phone]      Randy [name] [place] Medical Record #[id number] Date of Birth: 03-17-1946  Referring: Sherren Mocha, MD Primary Care: System, Pcp Not In Primary Cardiologist: Buford Dresser, MD   Chief Complaint:   POST OP FOLLOW UP DATE OF PROCEDURE:  07/01/2018 PREOPERATIVE DIAGNOSIS:  Recent non-ST elevation myocardial infarction and placement of intraaortic balloon pump. POSTOPERATIVE DIAGNOSIS:  Recent non-ST elevation myocardial infarction and placement of intraaortic balloon pump. SURGICAL PROCEDURE:  Urgent coronary artery bypass grafting with the preoperatively placed intraaortic balloon pump, coronary artery bypass grafting x4 with the left internal mammary to the left anterior descending coronary artery, reverse saphenous vein  graft to the diagonal coronary artery, reverse saphenous vein graft to the obtuse marginal coronary artery and reverse saphenous vein graft to the posterior lateral branch of the right coronary artery with right thigh and calf endoscopic vein harvesting  of the greater saphenous vein.  History of Present Illness:      Patient returns to the office today in follow-up after urgent coronary artery bypass grafting with intra-aortic balloon pump placed on 07/01/2018 when he presented with acute myocardial infarction. He was subsequently discharged home and returned with cellulitis in his leg treated with IV antibiotics with prompt resolution now comes to the office for follow-up visit.   Past Medical History:  Diagnosis Date  . Alcohol abuse    Last drink was 10 years ago (2009).  Marland Kitchen Hypertension   . NSTEMI (non-ST elevated myocardial infarction) ([place]) 06/29/2018   s/p CABG w/ LIMA-LAD, SVG-diagonal, SVG-OM, SVG-PLV  . PAF (paroxysmal atrial fibrillation) ([place]) 06/2018   Postop CABG  . Pulmonary embolus ([place]) 06/29/2018  . Tobacco abuse      Social History    Tobacco Use  Smoking Status Former Smoker  . Packs/day: 0.50  . Years: 50.00  . Pack years: 25.00  . Types: Cigarettes  . Last attempt to quit: 06/29/2018  . Years since quitting: 0.0  Smokeless Tobacco Never Used    Social History   Substance and Sexual Activity  Alcohol Use Not Currently     No Known Allergies  Current Outpatient Medications  Medication Sig Dispense Refill  . amiodarone (PACERONE) 200 MG tablet Take 1 tablet (200 mg total) by mouth daily. 60 tablet 0  . aspirin EC 81 MG EC tablet Take 1 tablet (81 mg total) by mouth daily.    Marland Kitchen atorvastatin (LIPITOR) 80 MG tablet Take 1 tablet (80 mg total) by mouth daily at 6 PM. 30 tablet 0  . doxycycline (VIBRAMYCIN) 50 MG capsule Take 100 mg by mouth two times daily 24 capsule 0  . losartan (COZAAR) 100 MG tablet Take 1 tablet (100 mg total) by mouth daily. 30 tablet 0  . metoprolol succinate (TOPROL-XL) 50 MG 24 hr tablet Take 1 tablet (50 mg total) by mouth daily. Take with or immediately following a meal. 30 tablet 0  . Rivaroxaban 15 & 20 MG TBPK Take as directed on package: Start with one 15mg  tablet by mouth twice a day with food. On Day 22, switch to one 20mg  tablet once a day with food. 51 each 0   No current facility-administered medications for this visit.        Physical Exam: BP (!) 146/88 (BP Location: Left Arm, Patient Position: Sitting, Cuff Size: Normal)  Pulse 76   Resp 18   Ht 5\' 4"  (1.626 m)   Wt 143 lb 11.2 oz (65.2 kg)   SpO2 99% Comment: RA  BMI 24.67 kg/m   General appearance: alert and cooperative Neurologic: intact Heart: regular rate and rhythm, S1, S2 normal, no murmur, click, rub or gallop Lungs: clear to auscultation bilaterally Abdomen: soft, non-tender; bowel sounds normal; no masses,  no organomegaly Extremities: extremities normal, atraumatic, no cyanosis or edema and Patient sternum is stable and well-healed, right vein harvest sites also healing well without evidence of  infection male, there is some small amount of eschar right knee incision    Diagnostic Studies & Laboratory data:     Recent Radiology Findings:   Dg Chest 2 View  Result Date: 07/30/2018 CLINICAL DATA:  73 year old male status post CABG in December. Shortness of breath. EXAM: CHEST - 2 VIEW COMPARISON:  07/06/2018 and earlier. FINDINGS: Small bilateral pleural effusions have resolved. Stable cardiac size at the upper limits of normal to mildly increased. Stable sequelae of CABG. Other mediastinal contours are within normal limits. Visualized tracheal air column is within normal limits. No pneumothorax, pulmonary edema or confluent pulmonary opacity. No acute osseous abnormality identified. Negative visible bowel gas pattern. IMPRESSION: Resolved small pleural effusions. No acute cardiopulmonary abnormality. Electronically Signed   By: Genevie Ann M.D.   On: 07/30/2018 15:51    I have independently reviewed the above radiology studies  and reviewed the findings with the patient.    Recent Lab Findings: Lab Results  Component Value Date   WBC 13.0 (H) 07/17/2018   HGB 10.3 (L) 07/17/2018   HCT 32.8 (L) 07/17/2018   PLT 515 (H) 07/17/2018   GLUCOSE 100 (H) 07/18/2018   CHOL 250 (H) 06/29/2018   TRIG 62 06/29/2018   HDL 69 06/29/2018   LDLCALC 169 (H) 06/29/2018   ALT 27 06/30/2018   AST 158 (H) 06/30/2018   NA 136 07/18/2018   K 4.2 07/18/2018   CL 106 07/18/2018   CREATININE 1.15 07/18/2018   BUN 18 07/18/2018   CO2 21 (L) 07/18/2018   TSH 7.113 (H) 07/04/2018   INR 1.42 07/01/2018   HGBA1C 5.4 06/29/2018      Assessment / Plan:      #1 status post urgent coronary artery bypass grafting with acute myocardial infarction #2 small pulmonary emboli at the time of admission in cardiogenic shock #3 mild elevation of TSH currently on amiodarone for postop atrial fib  Overall the patient has made good progress postoperatively, he had a short readmission for IV antibiotics for the  right vein harvest site this is now healing well without difficulty He plans to move back to [place], his son is assisting him in finding a primary care doctor. I have not made him a return appointment surgical office but would be glad to see him in his or cardiology request      Grace Isaac MD      [address] [place] [address] Office [phone]   Beeper [phone]  07/30/2018 4:59 PM

## 2018-07-30 NOTE — Progress Notes (Signed)
cxr 

## 2018-08-06 ENCOUNTER — Other Ambulatory Visit: Payer: Self-pay | Admitting: Cardiology

## 2018-08-06 MED ORDER — METOPROLOL SUCCINATE ER 50 MG PO TB24: 50.0000 mg | ORAL_TABLET | Freq: Every day | ORAL | 1 refills | Status: AC

## 2018-08-06 MED ORDER — RIVAROXABAN 20 MG PO TABS: 20.0000 mg | ORAL_TABLET | Freq: Every day | ORAL | 3 refills | Status: DC

## 2018-08-06 MED ORDER — LOSARTAN POTASSIUM 100 MG PO TABS: 100.0000 mg | ORAL_TABLET | Freq: Every day | ORAL | 1 refills | Status: AC

## 2018-08-06 MED ORDER — ATORVASTATIN CALCIUM 80 MG PO TABS: 80.0000 mg | ORAL_TABLET | Freq: Every day | ORAL | 1 refills | Status: AC

## 2018-08-06 NOTE — Telephone Encounter (Signed)
 *  STAT* If patient is at the pharmacy, call can be transferred to refill team.   1. Which medications need to be refilled? (please list name of each medication and dose if known)  atorvastatin (LIPITOR) 80 MG tablet losartan (COZAAR) 100 MG tablet metoprolol succinate (TOPROL-XL) 50 MG 24 hr tablet Rivaroxaban 15 & 20 MG TBPK  2. Which pharmacy/location (including street and city if local pharmacy) is medication to be sent to? [address]  3. Do they need a 30 day or 90 day supply? [place]

## 2018-08-06 NOTE — Addendum Note (Signed)
Addended by: Rockne Menghini on: 08/06/2018 05:05 PM   Modules accepted: Orders

## 2018-08-06 NOTE — Telephone Encounter (Signed)
Spoke with patient, he is aware that now on 20 mg qd with food.

## 2018-08-18 LAB — LIPID PANEL
CHOLESTEROL TOTAL: 123 mg/dL (ref 100–199)
Chol/HDL Ratio: 1.8 ratio (ref 0.0–5.0)
HDL: 68 mg/dL (ref >39)
LDL Calculated: 43 mg/dL (ref 0–99)
Triglycerides: 62 mg/dL (ref 0–149)
VLDL Cholesterol Cal: 12 mg/dL (ref 5–40)

## 2018-08-18 LAB — COMPREHENSIVE METABOLIC PANEL
ALT: 29 IU/L (ref 0–44)
AST: 31 IU/L (ref 0–40)
Albumin/Globulin Ratio: 2.3 — ABNORMAL HIGH (ref 1.2–2.2)
Albumin: 4.3 g/dL (ref 3.7–4.7)
Alkaline Phosphatase: 61 IU/L (ref 39–117)
BUN/Creatinine Ratio: 19 (ref 10–24)
BUN: 23 mg/dL (ref 8–27)
Bilirubin Total: 0.7 mg/dL (ref 0.0–1.2)
CO2: 21 mmol/L (ref 20–29)
Calcium: 9.1 mg/dL (ref 8.6–10.2)
Chloride: 102 mmol/L (ref 96–106)
Creatinine, Ser: 1.18 mg/dL (ref 0.76–1.27)
GFR calc Af Amer: 71 mL/min/{1.73_m2} (ref >59)
GFR calc non Af Amer: 62 mL/min/{1.73_m2} (ref >59)
GLUCOSE: 99 mg/dL (ref 65–99)
Globulin, Total: 1.9 g/dL (ref 1.5–4.5)
Potassium: 4.8 mmol/L (ref 3.5–5.2)
Sodium: 137 mmol/L (ref 134–144)
Total Protein: 6.2 g/dL (ref 6.0–8.5)

## 2018-08-21 ENCOUNTER — Other Ambulatory Visit: Payer: Self-pay | Admitting: Cardiology

## 2018-08-21 MED ORDER — AMIODARONE HCL 200 MG PO TABS: 200.0000 mg | ORAL_TABLET | Freq: Every day | ORAL | 3 refills | Status: DC

## 2018-08-21 NOTE — Telephone Encounter (Signed)
Rx(s) sent to pharmacy electronically.  

## 2018-08-21 NOTE — Telephone Encounter (Signed)
° ° °*  STAT* If patient is at the pharmacy, call can be transferred to refill team.   1. Which medications need to be refilled? (please list name of each medication and dose if known) amiodarone (PACERONE) 200 MG tablet  2. Which pharmacy/location (including street and city if local pharmacy) is medication to be sent to? [place], [place]  3. Do they need a 30 day or 90 day supply? [place]

## 2018-08-28 ENCOUNTER — Telehealth: Payer: Self-pay | Admitting: Cardiology

## 2018-08-28 NOTE — Telephone Encounter (Signed)
Jasmine from [facility] called and wanted Dr. Harrell Gave to sign off on a referral

## 2018-08-28 NOTE — Telephone Encounter (Signed)
Informed Jasmine from [facility] that Dr. Harrell Gave has been rounding in the hospital this week. She will return on Monday and informed that Nurse will have her sign papers upon return. Verbalized understanding.

## 2018-08-31 NOTE — Telephone Encounter (Signed)
Form faxed [facility].

## 2018-09-02 ENCOUNTER — Other Ambulatory Visit: Payer: Self-pay | Admitting: Cardiology

## 2018-09-02 MED ORDER — AMIODARONE HCL 200 MG PO TABS: 200.0000 mg | ORAL_TABLET | Freq: Every day | ORAL | 3 refills | Status: DC

## 2018-09-02 MED ORDER — RIVAROXABAN 20 MG PO TABS: 20.0000 mg | ORAL_TABLET | Freq: Every day | ORAL | 3 refills | Status: DC

## 2018-09-02 NOTE — Telephone Encounter (Signed)
Rx(s) sent to pharmacy electronically.  Patient inquired about cost difference between a 30-day and 90-day supply of Xarelto. He is changing ins companies on March 1st. I advised patient to contact his pharmacy for that information. He requested a refill and will discuss w/ the pharmacy staff upon pickup. Advised patient to return call to the office if he had difficulty affording medication and we could try to apply for assistance through the company.  Patient verbalized understanding and agreed with plan.

## 2018-09-02 NOTE — Telephone Encounter (Signed)
Pt calling requesting a refill on amiodarone and has an question concerning his Xarelto. Pt would like a call back at [phone]. Pt would like his Rx sent to [facility] on [address] Please address

## 2018-10-14 ENCOUNTER — Telehealth: Payer: Self-pay

## 2018-10-14 NOTE — Telephone Encounter (Signed)
TELEPHONE CALL NOTE  Randy [name] has been deemed a candidate for a follow-up tele-health visit to limit community exposure during the Covid-19 pandemic. I spoke with the patient via phone to ensure availability of phone/video source, confirm preferred email & phone number, and discuss instructions and expectations.  I reminded Randy [name] to be prepared with any vital sign and/or heart rhythm information that could potentially be obtained via home monitoring, at the time of his visit. I reminded Randy [name] to expect a phone call at the time of his visit if his visit.  Did the patient verbally acknowledge consent to treatment? Yes  Meryl Crutch, RN 10/14/2018 3:13 PM   DOWNLOADING THE [place]  - If Apple, go to [employer] and type in WebEx in the search bar. [place] [employer], the Pai/green circle. The app is free but as with any other app downloads, their phone may require them to verify saved payment information or Apple password. The patient does NOT have to create an account.  - If Android, ask patient to go to [employer] and type in WebEx in the search bar. [place] [employer], the [place]/green circle. The app is free but as with any other app downloads, their phone may require them to verify saved payment information or Android password. The patient does NOT have to create an account.   CONSENT FOR TELE-HEALTH VISIT - PLEASE REVIEW  I hereby voluntarily request, consent and authorize [facility] and its employed or contracted physicians, physician assistants, nurse practitioners or other licensed health care professionals (the Practitioner), to provide me with telemedicine health care services (the "Services") as deemed necessary by the treating Practitioner. I acknowledge and consent to receive the Services by the Practitioner via telemedicine. I understand that the telemedicine visit will involve communicating with the Practitioner  through live audiovisual communication technology and the disclosure of certain medical information by electronic transmission. I acknowledge that I have been given the opportunity to request an in-person assessment or other available alternative prior to the telemedicine visit and am voluntarily participating in the telemedicine visit.  I understand that I have the right to withhold or withdraw my consent to the use of telemedicine in the course of my care at any time, without affecting my right to future care or treatment, and that the Practitioner or I may terminate the telemedicine visit at any time. I understand that I have the right to inspect all information obtained and/or recorded in the course of the telemedicine visit and may receive copies of available information for a reasonable fee.  I understand that some of the potential risks of receiving the Services via telemedicine include:  Marland Kitchen Delay or interruption in medical evaluation due to technological equipment failure or disruption; . Information transmitted may not be sufficient (e.g. poor resolution of images) to allow for appropriate medical decision making by the Practitioner; and/or  . In rare instances, security protocols could fail, causing a breach of personal health information.  Furthermore, I acknowledge that it is my responsibility to provide information about my medical history, conditions and care that is complete and accurate to the best of my ability. I acknowledge that Practitioner's advice, recommendations, and/or decision may be based on factors not within their control, such as incomplete or inaccurate data provided by me or distortions of diagnostic images or specimens that may result from electronic transmissions. I understand that the practice of medicine is not an exact science and  that Practitioner makes no warranties or guarantees regarding treatment outcomes. I acknowledge that I will receive a copy of this consent  concurrently upon execution via email to the email address I last provided but may also request a printed copy by calling the office of [place].    I understand that my insurance will be billed for this visit.   I have read or had this consent read to me. . I understand the contents of this consent, which adequately explains the benefits and risks of the Services being provided via telemedicine.  . I have been provided ample opportunity to ask questions regarding this consent and the Services and have had my questions answered to my satisfaction. . I give my informed consent for the services to be provided through the use of telemedicine in my medical care  By participating in this telemedicine visit I agree to the above.

## 2018-10-14 NOTE — Telephone Encounter (Signed)
Opened in error

## 2018-10-15 ENCOUNTER — Other Ambulatory Visit: Payer: Self-pay

## 2018-10-16 ENCOUNTER — Telehealth (INDEPENDENT_AMBULATORY_CARE_PROVIDER_SITE_OTHER): Payer: Medicare Other | Admitting: Cardiology

## 2018-10-16 ENCOUNTER — Encounter: Payer: Self-pay | Admitting: Cardiology

## 2018-10-16 VITALS — Ht 64.0 in | Wt 146.6 lb

## 2018-10-16 DIAGNOSIS — E785 Hyperlipidemia, unspecified: Secondary | ICD-10-CM

## 2018-10-16 DIAGNOSIS — Z951 Presence of aortocoronary bypass graft: Secondary | ICD-10-CM

## 2018-10-16 DIAGNOSIS — I2510 Atherosclerotic heart disease of native coronary artery without angina pectoris: Secondary | ICD-10-CM

## 2018-10-16 DIAGNOSIS — F17210 Nicotine dependence, cigarettes, uncomplicated: Secondary | ICD-10-CM

## 2018-10-16 DIAGNOSIS — I4891 Unspecified atrial fibrillation: Secondary | ICD-10-CM

## 2018-10-16 DIAGNOSIS — I9789 Other postprocedural complications and disorders of the circulatory system, not elsewhere classified: Secondary | ICD-10-CM

## 2018-10-16 DIAGNOSIS — I2583 Coronary atherosclerosis due to lipid rich plaque: Secondary | ICD-10-CM | POA: Diagnosis not present

## 2018-10-16 DIAGNOSIS — Z716 Tobacco abuse counseling: Secondary | ICD-10-CM

## 2018-10-16 NOTE — Patient Instructions (Signed)
Medication Instructions:  Stop: Xarelto 20 mg          Amiodarone 200 mg  If you need a refill on your cardiac medications before your next appointment, please call your pharmacy.   Lab work: None  Testing/Procedures: None  Follow-Up: Your physician recommends that you schedule a follow-up appointment in 6 months with Dr. Harrell Gave.

## 2018-10-16 NOTE — Progress Notes (Signed)
Virtual Visit via Telephone Note    Evaluation Performed:  Follow-up visit  This visit type was conducted due to national recommendations for restrictions regarding the COVID-19 Pandemic (e.g. social distancing).  This format is felt to be most appropriate for this patient at this time.  All issues noted in this document were discussed and addressed.  No physical exam was performed (except for noted visual exam findings with Video Visits).  Please refer to the patient's chart (MyChart message for video visits and phone note for telephone visits) for the patient's consent to telehealth for [facility].  Date:  10/16/2018   ID:  Randy [name], DOB 1945-11-29, MRN [id number]  Patient Location:  [address] [place] [address]   Provider location:   Remote access with base in [place], [place] - [facility]   PCP:  System, Pcp Not In  Cardiologist:  Buford Dresser, MD   Chief Complaint:  Post CABG follow up  History of Present Illness:    Randy [name] is a 73 y.o. male who presents via audio/video conferencing for a telehealth visit today.   The patient does not have symptoms concerning for COVID-19 infection (fever, chills, cough, or new shortness of breath).   PMH: NSTEMI 06/2018 s/p 4V CABG for severe multivessel CAD, complicated by post op atrial fibrillation. Also found to have small bilateral pulmonary embolisms, thought to be 2/2 recent travel, treated with anticoagulation. Was readmitted with cellulitis at right leg vein harvest site.   Today: has been doing well overall. Doing cardiac rehab through [facility], has benefited greatly from this. No chest pain or shortness of breath. Mild sternal site numbness improving. Leg site improving as well. Did revert back to smoking on occasion, working on it. No issues with medications.  Discussed his post op afib, risk of recurrence, reasons for anticoagulation, reviewed below.  Denies chest pain,  shortness of breath at rest or with normal exertion. No PND, orthopnea, LE edema or unexpected weight gain. No syncope or palpitations.   Prior CV studies:   The following studies were reviewed today: Echo, cath, op note, ECGs, labs  Past Medical History:  Diagnosis Date  . Alcohol abuse    Last drink was 10 years ago (2009).  Marland Kitchen Hypertension   . NSTEMI (non-ST elevated myocardial infarction) ([place]) 06/29/2018   s/p CABG w/ LIMA-LAD, SVG-diagonal, SVG-OM, SVG-PLV  . PAF (paroxysmal atrial fibrillation) ([place]) 06/2018   Postop CABG  . Pulmonary embolus ([place]) 06/29/2018  . Tobacco abuse    Past Surgical History:  Procedure Laterality Date  . CORONARY ARTERY BYPASS GRAFT N/A 07/01/2018   Procedure: CORONARY ARTERY BYPASS GRAFTING (CABG) x 4, LIMA TO LAD, SVG TO OM1, SVG TO DIAG, SVG TO PL, USING LEFT INTERNAL MAMMARY ARTERY AND RIGHT GREATER SAPHENOUS VEIN HARVESTED ENDOSCOPICALLY;  Surgeon: Grace Isaac, MD;  Location: [place];  Service: Open Heart Surgery;  Laterality: N/A;  . IABP INSERTION Right 06/29/2018   Procedure: IABP Insertion;  Surgeon: Sherren Mocha, MD;  Location: [place] CV [facility];  Service: Cardiovascular;  Laterality: Right;  . LEFT HEART CATH AND CORONARY ANGIOGRAPHY N/A 06/29/2018   Procedure: LEFT HEART CATH AND CORONARY ANGIOGRAPHY;  Surgeon: Sherren Mocha, MD;  Location: [place] CV [facility];  Service: Cardiovascular;  Laterality: N/A;  . TEE WITHOUT CARDIOVERSION N/A 07/01/2018   Procedure: TRANSESOPHAGEAL ECHOCARDIOGRAM (TEE);  Surgeon: Grace Isaac, MD;  Location: [place];  Service: Open Heart Surgery;  Laterality: N/A;  No outpatient medications have been marked as taking for the 10/16/18 encounter (Appointment) with Buford Dresser, MD.     Allergies:   Patient has no known allergies.   Social History   Tobacco Use  . Smoking status: Former Smoker    Packs/day: 0.50    Years: 50.00    Pack years: 25.00    Types: Cigarettes    Last  attempt to quit: 06/29/2018    Years since quitting: 0.2  . Smokeless tobacco: Never Used  Substance Use Topics  . Alcohol use: Not Currently  . Drug use: Never     Family Hx: The patient's family history includes CAD in his father. There is no history of Stroke or Sudden death.  ROS:   Please see the history of present illness.    All other systems reviewed and are negative.   Labs/Other Tests and Data Reviewed:    Recent Labs: 06/30/2018: B Natriuretic Peptide 1,369.9 07/04/2018: Magnesium 2.1; TSH 7.113 07/17/2018: Hemoglobin 10.3; Platelets 515 08/18/2018: ALT 29; BUN 23; Creatinine, Ser 1.18; Potassium 4.8; Sodium 137   Recent Lipid Panel Lab Results  Component Value Date/Time   CHOL 123 08/18/2018 09:47 AM   TRIG 62 08/18/2018 09:47 AM   HDL 68 08/18/2018 09:47 AM   CHOLHDL 1.8 08/18/2018 09:47 AM   CHOLHDL 3.6 06/29/2018 10:35 AM   LDLCALC 43 08/18/2018 09:47 AM    Wt Readings from Last 3 Encounters:  07/30/18 143 lb 11.2 oz (65.2 kg)  07/20/18 153 lb (69.4 kg)  07/17/18 147 lb 7.8 oz (66.9 kg)     Objective:    Vital Signs:  There were no vitals taken for this visit. No home BP cuff, but reports it is monitored at cardiac rehab.  ASSESSMENT & PLAN:    Severe multivessel CAD, with NSTEMI on presentation 06/2018 requiring IABP, now s/p 4V CABG by Dr. Servando Snare on 07/01/18 (LIMA-LAG, SVG-OM1, SVG-Diag, SVG-PL) -Risk factors: long history of tobacco use. No diabetes (A1c normal at 5.4). Elevated LDL of 169 -on aspirin 81 mg,  atorvastatin 80 mg -on metoprolol succinate 50 mg daily  Postoperative atrial fibrillation: no recurrence that he knows of, has been checked at cardiac rehab -discussed options, will trial stopping amiodarone today -chadsvasc=5, largely due to VTE/PE. Discussed risk of stroke with silent afib, he understands. Will finish the rivaroxaban he has for PE, then stop. If afib detected again, needs to be on [facility] long term.  Ischemic cardiomyopathy,  EF 40-45% pre-CABG -on metoprolol succinate, losartan 100 mg daily -reports good BP control, not available to me -if BP room, would consider adding spironolactone -can discuss recheck of echo based on symptoms at follow up  Hyperlipidemia: LDL 169, Tchol 250, HDL 69 on presentation. Goal LDL <70 -on atorvastatin 80 mg -recheck 08/18/18 with LDL 43  Tobacco abuse: The patient was counseled on tobacco cessation today for 4 minutes.  Counseling included reviewing the risks of smoking tobacco products, how it impacts the patient's current medical diagnoses and different strategies for quitting.  Pharmacotherapy to aid in tobacco cessation was not prescribed today.  Small bilateral PE -suspect this was provoked with travel, completing 3 mos continuous AC and then stop  COVID-19 Education: The signs and symptoms of COVID-19 were discussed with the patient and how to seek care for testing (follow up with PCP or arrange E-visit).  The importance of social distancing was discussed today.  Patient Risk:   After full review of this patient's clinical status, I feel that  they are at least moderate risk at this time.  Time:   Today, I have spent 24 minutes with the patient with telehealth technology discussing the above issues.    Patient Instructions  Medication Instructions:  Stop: Xarelto 20 mg          Amiodarone 200 mg  If you need a refill on your cardiac medications before your next appointment, please call your pharmacy.   Lab work: None  Testing/Procedures: None  Follow-Up: Your physician recommends that you schedule a follow-up appointment in 6 months with Dr. Harrell Gave.       Medication Adjustments/Labs and Tests Ordered: Current medicines are reviewed at length with the patient today.  Concerns regarding medicines are outlined above.  Tests Ordered: No orders of the defined types were placed in this encounter.  Medication Changes: No orders of the defined types  were placed in this encounter.   Disposition:  Follow up in 6 month(s)  Signed, Buford Dresser, MD  10/16/2018 11:08 AM    [facility]

## 2018-10-19 ENCOUNTER — Ambulatory Visit: Payer: Medicare PPO | Admitting: Cardiology

## 2018-12-30 ENCOUNTER — Emergency Department (HOSPITAL_COMMUNITY): Payer: Medicare Other

## 2018-12-30 ENCOUNTER — Inpatient Hospital Stay (HOSPITAL_COMMUNITY): Payer: Medicare Other | Admitting: Certified Registered Nurse Anesthetist

## 2018-12-30 ENCOUNTER — Inpatient Hospital Stay (HOSPITAL_COMMUNITY): Payer: Medicare Other

## 2018-12-30 ENCOUNTER — Other Ambulatory Visit: Payer: Self-pay

## 2018-12-30 ENCOUNTER — Encounter (HOSPITAL_COMMUNITY): Payer: Self-pay | Admitting: Emergency Medicine

## 2018-12-30 ENCOUNTER — Encounter (HOSPITAL_COMMUNITY): Admission: EM | Disposition: E | Payer: Self-pay | Source: Home / Self Care | Attending: Neurology

## 2018-12-30 ENCOUNTER — Inpatient Hospital Stay (HOSPITAL_COMMUNITY)
Admission: EM | Admit: 2018-12-30 | Discharge: 2019-01-13 | DRG: 023 | Disposition: E | Payer: Medicare Other | Attending: Neurology | Admitting: Neurology

## 2018-12-30 DIAGNOSIS — G40109 Localization-related (focal) (partial) symptomatic epilepsy and epileptic syndromes with simple partial seizures, not intractable, without status epilepticus: Secondary | ICD-10-CM | POA: Diagnosis not present

## 2018-12-30 DIAGNOSIS — Z87891 Personal history of nicotine dependence: Secondary | ICD-10-CM

## 2018-12-30 DIAGNOSIS — R569 Unspecified convulsions: Secondary | ICD-10-CM | POA: Diagnosis not present

## 2018-12-30 DIAGNOSIS — R29810 Facial weakness: Secondary | ICD-10-CM | POA: Diagnosis present

## 2018-12-30 DIAGNOSIS — G911 Obstructive hydrocephalus: Secondary | ICD-10-CM | POA: Diagnosis present

## 2018-12-30 DIAGNOSIS — E878 Other disorders of electrolyte and fluid balance, not elsewhere classified: Secondary | ICD-10-CM | POA: Diagnosis not present

## 2018-12-30 DIAGNOSIS — R1310 Dysphagia, unspecified: Secondary | ICD-10-CM | POA: Diagnosis present

## 2018-12-30 DIAGNOSIS — G9389 Other specified disorders of brain: Secondary | ICD-10-CM | POA: Diagnosis not present

## 2018-12-30 DIAGNOSIS — I161 Hypertensive emergency: Secondary | ICD-10-CM | POA: Diagnosis present

## 2018-12-30 DIAGNOSIS — I619 Nontraumatic intracerebral hemorrhage, unspecified: Secondary | ICD-10-CM | POA: Diagnosis not present

## 2018-12-30 DIAGNOSIS — J9601 Acute respiratory failure with hypoxia: Secondary | ICD-10-CM | POA: Diagnosis present

## 2018-12-30 DIAGNOSIS — I614 Nontraumatic intracerebral hemorrhage in cerebellum: Secondary | ICD-10-CM | POA: Diagnosis present

## 2018-12-30 DIAGNOSIS — T85830A Hemorrhage due to nervous system prosthetic devices, implants and grafts, initial encounter: Secondary | ICD-10-CM | POA: Diagnosis not present

## 2018-12-30 DIAGNOSIS — R471 Dysarthria and anarthria: Secondary | ICD-10-CM | POA: Diagnosis present

## 2018-12-30 DIAGNOSIS — R42 Dizziness and giddiness: Secondary | ICD-10-CM

## 2018-12-30 DIAGNOSIS — G960 Cerebrospinal fluid leak: Secondary | ICD-10-CM | POA: Diagnosis not present

## 2018-12-30 DIAGNOSIS — I2510 Atherosclerotic heart disease of native coronary artery without angina pectoris: Secondary | ICD-10-CM | POA: Diagnosis present

## 2018-12-30 DIAGNOSIS — Z951 Presence of aortocoronary bypass graft: Secondary | ICD-10-CM

## 2018-12-30 DIAGNOSIS — G935 Compression of brain: Secondary | ICD-10-CM | POA: Diagnosis not present

## 2018-12-30 DIAGNOSIS — Z20828 Contact with and (suspected) exposure to other viral communicable diseases: Secondary | ICD-10-CM | POA: Diagnosis present

## 2018-12-30 DIAGNOSIS — Z9289 Personal history of other medical treatment: Secondary | ICD-10-CM

## 2018-12-30 DIAGNOSIS — G92 Toxic encephalopathy: Secondary | ICD-10-CM | POA: Diagnosis present

## 2018-12-30 DIAGNOSIS — I25709 Atherosclerosis of coronary artery bypass graft(s), unspecified, with unspecified angina pectoris: Secondary | ICD-10-CM | POA: Diagnosis not present

## 2018-12-30 DIAGNOSIS — G936 Cerebral edema: Secondary | ICD-10-CM | POA: Diagnosis present

## 2018-12-30 DIAGNOSIS — I480 Paroxysmal atrial fibrillation: Secondary | ICD-10-CM | POA: Diagnosis not present

## 2018-12-30 DIAGNOSIS — G932 Benign intracranial hypertension: Secondary | ICD-10-CM | POA: Diagnosis not present

## 2018-12-30 DIAGNOSIS — Z7901 Long term (current) use of anticoagulants: Secondary | ICD-10-CM | POA: Diagnosis not present

## 2018-12-30 DIAGNOSIS — Z515 Encounter for palliative care: Secondary | ICD-10-CM | POA: Diagnosis not present

## 2018-12-30 DIAGNOSIS — Z79899 Other long term (current) drug therapy: Secondary | ICD-10-CM

## 2018-12-30 DIAGNOSIS — R451 Restlessness and agitation: Secondary | ICD-10-CM | POA: Diagnosis not present

## 2018-12-30 DIAGNOSIS — F1021 Alcohol dependence, in remission: Secondary | ICD-10-CM | POA: Diagnosis present

## 2018-12-30 DIAGNOSIS — R509 Fever, unspecified: Secondary | ICD-10-CM | POA: Diagnosis not present

## 2018-12-30 DIAGNOSIS — T17908A Unspecified foreign body in respiratory tract, part unspecified causing other injury, initial encounter: Secondary | ICD-10-CM

## 2018-12-30 DIAGNOSIS — I160 Hypertensive urgency: Secondary | ICD-10-CM | POA: Diagnosis not present

## 2018-12-30 DIAGNOSIS — Z781 Physical restraint status: Secondary | ICD-10-CM

## 2018-12-30 DIAGNOSIS — G931 Anoxic brain damage, not elsewhere classified: Secondary | ICD-10-CM | POA: Diagnosis present

## 2018-12-30 DIAGNOSIS — D72829 Elevated white blood cell count, unspecified: Secondary | ICD-10-CM | POA: Diagnosis not present

## 2018-12-30 DIAGNOSIS — I482 Chronic atrial fibrillation, unspecified: Secondary | ICD-10-CM | POA: Diagnosis not present

## 2018-12-30 DIAGNOSIS — J988 Other specified respiratory disorders: Secondary | ICD-10-CM | POA: Diagnosis not present

## 2018-12-30 DIAGNOSIS — D696 Thrombocytopenia, unspecified: Secondary | ICD-10-CM | POA: Diagnosis not present

## 2018-12-30 DIAGNOSIS — Y752 Prosthetic and other implants, materials and neurological devices associated with adverse incidents: Secondary | ICD-10-CM | POA: Diagnosis not present

## 2018-12-30 DIAGNOSIS — Y92230 Patient room in hospital as the place of occurrence of the external cause: Secondary | ICD-10-CM | POA: Diagnosis not present

## 2018-12-30 DIAGNOSIS — Z978 Presence of other specified devices: Secondary | ICD-10-CM | POA: Diagnosis not present

## 2018-12-30 DIAGNOSIS — I639 Cerebral infarction, unspecified: Secondary | ICD-10-CM | POA: Diagnosis not present

## 2018-12-30 DIAGNOSIS — D6832 Hemorrhagic disorder due to extrinsic circulating anticoagulants: Secondary | ICD-10-CM | POA: Diagnosis present

## 2018-12-30 DIAGNOSIS — Z8249 Family history of ischemic heart disease and other diseases of the circulatory system: Secondary | ICD-10-CM

## 2018-12-30 DIAGNOSIS — E876 Hypokalemia: Secondary | ICD-10-CM | POA: Diagnosis not present

## 2018-12-30 DIAGNOSIS — J9811 Atelectasis: Secondary | ICD-10-CM | POA: Diagnosis not present

## 2018-12-30 DIAGNOSIS — G9340 Encephalopathy, unspecified: Secondary | ICD-10-CM | POA: Diagnosis not present

## 2018-12-30 DIAGNOSIS — I959 Hypotension, unspecified: Secondary | ICD-10-CM | POA: Diagnosis not present

## 2018-12-30 DIAGNOSIS — R001 Bradycardia, unspecified: Secondary | ICD-10-CM

## 2018-12-30 DIAGNOSIS — I252 Old myocardial infarction: Secondary | ICD-10-CM

## 2018-12-30 DIAGNOSIS — I10 Essential (primary) hypertension: Secondary | ICD-10-CM | POA: Diagnosis present

## 2018-12-30 DIAGNOSIS — E1165 Type 2 diabetes mellitus with hyperglycemia: Secondary | ICD-10-CM | POA: Diagnosis present

## 2018-12-30 DIAGNOSIS — Z7982 Long term (current) use of aspirin: Secondary | ICD-10-CM

## 2018-12-30 DIAGNOSIS — T45515A Adverse effect of anticoagulants, initial encounter: Secondary | ICD-10-CM | POA: Diagnosis present

## 2018-12-30 DIAGNOSIS — I615 Nontraumatic intracerebral hemorrhage, intraventricular: Secondary | ICD-10-CM | POA: Diagnosis present

## 2018-12-30 DIAGNOSIS — H5500 Unspecified nystagmus: Secondary | ICD-10-CM | POA: Diagnosis present

## 2018-12-30 DIAGNOSIS — D649 Anemia, unspecified: Secondary | ICD-10-CM | POA: Diagnosis not present

## 2018-12-30 DIAGNOSIS — Z66 Do not resuscitate: Secondary | ICD-10-CM | POA: Diagnosis not present

## 2018-12-30 DIAGNOSIS — E785 Hyperlipidemia, unspecified: Secondary | ICD-10-CM | POA: Diagnosis present

## 2018-12-30 DIAGNOSIS — S065X9A Traumatic subdural hemorrhage with loss of consciousness of unspecified duration, initial encounter: Secondary | ICD-10-CM | POA: Diagnosis not present

## 2018-12-30 DIAGNOSIS — E872 Acidosis, unspecified: Secondary | ICD-10-CM

## 2018-12-30 DIAGNOSIS — G8194 Hemiplegia, unspecified affecting left nondominant side: Secondary | ICD-10-CM | POA: Diagnosis present

## 2018-12-30 DIAGNOSIS — Z9889 Other specified postprocedural states: Secondary | ICD-10-CM | POA: Diagnosis not present

## 2018-12-30 DIAGNOSIS — D181 Lymphangioma, any site: Secondary | ICD-10-CM | POA: Diagnosis not present

## 2018-12-30 DIAGNOSIS — R1312 Dysphagia, oropharyngeal phase: Secondary | ICD-10-CM | POA: Diagnosis not present

## 2018-12-30 DIAGNOSIS — Z86711 Personal history of pulmonary embolism: Secondary | ICD-10-CM

## 2018-12-30 HISTORY — PX: SUBOCCIPITAL CRANIECTOMY CERVICAL LAMINECTOMY: SHX5404

## 2018-12-30 LAB — BLOOD GAS, ARTERIAL
Acid-base deficit: 2.5 mmol/L — ABNORMAL HIGH (ref 0.0–2.0)
Bicarbonate: 22.4 mmol/L (ref 20.0–28.0)
Drawn by: 252031
FIO2: 0.5
MECHVT: 470 mL
O2 Saturation: 97.7 %
PEEP: 5 cmH2O
Patient temperature: 98.6
RATE: 20 resp/min
pCO2 arterial: 42.3 mmHg (ref 32.0–48.0)
pH, Arterial: 7.343 — ABNORMAL LOW (ref 7.350–7.450)
pO2, Arterial: 109 mmHg — ABNORMAL HIGH (ref 83.0–108.0)

## 2018-12-30 LAB — LIPID PANEL
Cholesterol: 126 mg/dL (ref 0–200)
HDL: 58 mg/dL (ref >40)
LDL Cholesterol: 48 mg/dL (ref 0–99)
Total CHOL/HDL Ratio: 2.2 RATIO
Triglycerides: 100 mg/dL (ref <150)
VLDL: 20 mg/dL (ref 0–40)

## 2018-12-30 LAB — COMPREHENSIVE METABOLIC PANEL
ALT: 30 U/L (ref 0–44)
AST: 34 U/L (ref 15–41)
Albumin: 4.1 g/dL (ref 3.5–5.0)
Alkaline Phosphatase: 41 U/L (ref 38–126)
Anion gap: 12 (ref 5–15)
BUN: 18 mg/dL (ref 8–23)
CO2: 19 mmol/L — ABNORMAL LOW (ref 22–32)
Calcium: 9.2 mg/dL (ref 8.9–10.3)
Chloride: 106 mmol/L (ref 98–111)
Creatinine, Ser: 1.17 mg/dL (ref 0.61–1.24)
GFR calc Af Amer: >60 mL/min (ref >60)
GFR calc non Af Amer: >60 mL/min (ref >60)
Glucose, Bld: 139 mg/dL — ABNORMAL HIGH (ref 70–99)
Potassium: 5 mmol/L (ref 3.5–5.1)
Sodium: 137 mmol/L (ref 135–145)
Total Bilirubin: 1.2 mg/dL (ref 0.3–1.2)
Total Protein: 6.8 g/dL (ref 6.5–8.1)

## 2018-12-30 LAB — I-STAT CHEM 8, ED
BUN: 22 mg/dL (ref 8–23)
Calcium, Ion: 1.18 mmol/L (ref 1.15–1.40)
Chloride: 105 mmol/L (ref 98–111)
Creatinine, Ser: 1 mg/dL (ref 0.61–1.24)
Glucose, Bld: 139 mg/dL — ABNORMAL HIGH (ref 70–99)
HCT: 50 % (ref 39.0–52.0)
Hemoglobin: 17 g/dL (ref 13.0–17.0)
Potassium: 4.9 mmol/L (ref 3.5–5.1)
Sodium: 137 mmol/L (ref 135–145)
TCO2: 25 mmol/L (ref 22–32)

## 2018-12-30 LAB — RAPID URINE DRUG SCREEN, HOSP PERFORMED
Amphetamines: NOT DETECTED
Barbiturates: NOT DETECTED
Benzodiazepines: NOT DETECTED
Cocaine: NOT DETECTED
Opiates: NOT DETECTED
Tetrahydrocannabinol: NOT DETECTED

## 2018-12-30 LAB — TYPE AND SCREEN
ABO/RH(D): A POS
Antibody Screen: NEGATIVE

## 2018-12-30 LAB — CBC
HCT: 48.9 % (ref 39.0–52.0)
Hemoglobin: 15.9 g/dL (ref 13.0–17.0)
MCH: 29.9 pg (ref 26.0–34.0)
MCHC: 32.5 g/dL (ref 30.0–36.0)
MCV: 91.9 fL (ref 80.0–100.0)
Platelets: 233 10*3/uL (ref 150–400)
RBC: 5.32 MIL/uL (ref 4.22–5.81)
RDW: 14.8 % (ref 11.5–15.5)
WBC: 14.8 10*3/uL — ABNORMAL HIGH (ref 4.0–10.5)
nRBC: 0 % (ref 0.0–0.2)

## 2018-12-30 LAB — TROPONIN I: Troponin I: 0.05 ng/mL (ref <0.03)

## 2018-12-30 LAB — HEMOGLOBIN A1C
Hgb A1c MFr Bld: 6.1 % — ABNORMAL HIGH (ref 4.8–5.6)
Mean Plasma Glucose: 128.37 mg/dL

## 2018-12-30 LAB — I-STAT TROPONIN, ED: Troponin i, poc: 0.05 ng/mL (ref 0.00–0.08)

## 2018-12-30 LAB — PROTIME-INR
INR: 1.6 — ABNORMAL HIGH (ref 0.8–1.2)
Prothrombin Time: 18.7 seconds — ABNORMAL HIGH (ref 11.4–15.2)

## 2018-12-30 LAB — SARS CORONAVIRUS 2: SARS Coronavirus 2: NOT DETECTED

## 2018-12-30 LAB — LIPASE, BLOOD: Lipase: 28 U/L (ref 11–51)

## 2018-12-30 LAB — GLUCOSE, CAPILLARY: Glucose-Capillary: 90 mg/dL (ref 70–99)

## 2018-12-30 LAB — CBG MONITORING, ED: Glucose-Capillary: 114 mg/dL — ABNORMAL HIGH (ref 70–99)

## 2018-12-30 SURGERY — SUBOCCIPITAL CRANIECTOMY CERVICAL LAMINECTOMY/DURAPLASTY
Anesthesia: General | Site: Head

## 2018-12-30 MED ORDER — DEXAMETHASONE SODIUM PHOSPHATE 4 MG/ML IJ SOLN
4.0000 mg | Freq: Four times a day (QID) | INTRAMUSCULAR | Status: AC
  Administered 2019-01-01 (×4): 4 mg via INTRAVENOUS
  Filled 2018-12-30 (×3): qty 1

## 2018-12-30 MED ORDER — IOHEXOL 350 MG/ML SOLN
100.0000 mL | Freq: Once | INTRAVENOUS | Status: AC | PRN
  Administered 2018-12-30: 100 mL via INTRAVENOUS

## 2018-12-30 MED ORDER — ONDANSETRON HCL 4 MG/2ML IJ SOLN
4.0000 mg | Freq: Once | INTRAMUSCULAR | Status: AC
  Administered 2018-12-30: 4 mg via INTRAVENOUS
  Filled 2018-12-30: qty 2

## 2018-12-30 MED ORDER — PROMETHAZINE HCL 12.5 MG PO TABS
12.5000 mg | ORAL_TABLET | ORAL | Status: DC | PRN
  Filled 2018-12-30: qty 2

## 2018-12-30 MED ORDER — CLEVIDIPINE BUTYRATE 0.5 MG/ML IV EMUL
0.0000 mg/h | INTRAVENOUS | Status: DC
  Administered 2018-12-30: 1 mg/h via INTRAVENOUS
  Administered 2018-12-31: 21 mg/h via INTRAVENOUS
  Administered 2018-12-31: 13 mg/h via INTRAVENOUS
  Administered 2018-12-31: 20 mg/h via INTRAVENOUS
  Administered 2018-12-31: 18 mg/h via INTRAVENOUS
  Administered 2018-12-31: 20 mg/h via INTRAVENOUS
  Administered 2018-12-31: 21 mg/h via INTRAVENOUS
  Administered 2018-12-31: 14 mg/h via INTRAVENOUS
  Administered 2018-12-31 – 2019-01-01 (×2): 20 mg/h via INTRAVENOUS
  Administered 2019-01-01: 1 mg/h via INTRAVENOUS
  Administered 2019-01-01 (×2): 20 mg/h via INTRAVENOUS
  Administered 2019-01-01: 16 mg/h via INTRAVENOUS
  Administered 2019-01-01 – 2019-01-02 (×2): 6 mg/h via INTRAVENOUS
  Administered 2019-01-02 (×2): 12 mg/h via INTRAVENOUS
  Administered 2019-01-03 (×2): 6 mg/h via INTRAVENOUS
  Administered 2019-01-03 (×2): 8 mg/h via INTRAVENOUS
  Administered 2019-01-04: 12 mg/h via INTRAVENOUS
  Administered 2019-01-04: 9 mg/h via INTRAVENOUS
  Administered 2019-01-04: 8 mg/h via INTRAVENOUS
  Administered 2019-01-05: 5 mg/h via INTRAVENOUS
  Administered 2019-01-05: 4 mg/h via INTRAVENOUS
  Administered 2019-01-05: 12 mg/h via INTRAVENOUS
  Filled 2018-12-30 (×4): qty 50
  Filled 2018-12-30: qty 100
  Filled 2018-12-30 (×3): qty 50
  Filled 2018-12-30 (×3): qty 100
  Filled 2018-12-30 (×15): qty 50
  Filled 2018-12-30: qty 100
  Filled 2018-12-30: qty 50

## 2018-12-30 MED ORDER — CEFAZOLIN SODIUM-DEXTROSE 1-4 GM/50ML-% IV SOLN
1.0000 g | Freq: Three times a day (TID) | INTRAVENOUS | Status: AC
  Administered 2018-12-31 (×2): 1 g via INTRAVENOUS
  Filled 2018-12-30 (×2): qty 50

## 2018-12-30 MED ORDER — SODIUM CHLORIDE 0.9 % IV SOLN
INTRAVENOUS | Status: DC | PRN
  Administered 2018-12-30: 25 ug/min via INTRAVENOUS

## 2018-12-30 MED ORDER — BACITRACIN ZINC 500 UNIT/GM EX OINT
TOPICAL_OINTMENT | CUTANEOUS | Status: AC
  Filled 2018-12-30: qty 28.35

## 2018-12-30 MED ORDER — THROMBIN 5000 UNITS EX SOLR
CUTANEOUS | Status: AC
  Filled 2018-12-30: qty 5000

## 2018-12-30 MED ORDER — SENNA 8.6 MG PO TABS
1.0000 | ORAL_TABLET | Freq: Two times a day (BID) | ORAL | Status: DC
  Administered 2019-01-01 – 2019-01-04 (×4): 8.6 mg via ORAL
  Filled 2018-12-30 (×6): qty 1

## 2018-12-30 MED ORDER — PROPOFOL 1000 MG/100ML IV EMUL
INTRAVENOUS | Status: DC | PRN
  Administered 2018-12-30: 22:00:00 via INTRAVENOUS
  Administered 2018-12-30: 20 ug via INTRAVENOUS

## 2018-12-30 MED ORDER — CLEVIDIPINE BUTYRATE 0.5 MG/ML IV EMUL
0.0000 mg/h | INTRAVENOUS | Status: DC
  Administered 2018-12-30: 2 mg/h via INTRAVENOUS

## 2018-12-30 MED ORDER — STROKE: EARLY STAGES OF RECOVERY BOOK
Freq: Once | Status: AC
  Administered 2018-12-31
  Filled 2018-12-30: qty 1

## 2018-12-30 MED ORDER — CEFAZOLIN SODIUM-DEXTROSE 2-3 GM-%(50ML) IV SOLR
INTRAVENOUS | Status: DC | PRN
  Administered 2018-12-30: 2 g via INTRAVENOUS

## 2018-12-30 MED ORDER — PANTOPRAZOLE SODIUM 40 MG IV SOLR: 40.0000 mg | Freq: Every day | INTRAVENOUS | Status: DC

## 2018-12-30 MED ORDER — ACETAMINOPHEN 650 MG RE SUPP: 650.0000 mg | RECTAL | Status: DC | PRN

## 2018-12-30 MED ORDER — SENNOSIDES-DOCUSATE SODIUM 8.6-50 MG PO TABS: 1.0000 | ORAL_TABLET | Freq: Two times a day (BID) | ORAL | Status: DC

## 2018-12-30 MED ORDER — 0.9 % SODIUM CHLORIDE (POUR BTL) OPTIME
TOPICAL | Status: DC | PRN
  Administered 2018-12-30: 1000 mL

## 2018-12-30 MED ORDER — PROPOFOL 10 MG/ML IV BOLUS
INTRAVENOUS | Status: AC
  Filled 2018-12-30: qty 20

## 2018-12-30 MED ORDER — PROPOFOL 1000 MG/100ML IV EMUL
INTRAVENOUS | Status: AC
  Filled 2018-12-30: qty 100

## 2018-12-30 MED ORDER — PROPOFOL 10 MG/ML IV BOLUS
INTRAVENOUS | Status: DC | PRN
  Administered 2018-12-30: 75 mg via INTRAVENOUS

## 2018-12-30 MED ORDER — PHENYLEPHRINE 40 MCG/ML (10ML) SYRINGE FOR IV PUSH (FOR BLOOD PRESSURE SUPPORT)
PREFILLED_SYRINGE | INTRAVENOUS | Status: AC
  Filled 2018-12-30: qty 10

## 2018-12-30 MED ORDER — SODIUM CHLORIDE 0.9% IV SOLUTION: Freq: Once | INTRAVENOUS | Status: DC

## 2018-12-30 MED ORDER — ACETAMINOPHEN 160 MG/5ML PO SOLN: 650.0000 mg | ORAL | Status: DC | PRN

## 2018-12-30 MED ORDER — BACITRACIN ZINC 500 UNIT/GM EX OINT
TOPICAL_OINTMENT | CUTANEOUS | Status: DC | PRN
  Administered 2018-12-30: 1 via TOPICAL

## 2018-12-30 MED ORDER — POTASSIUM CHLORIDE IN NACL 20-0.9 MEQ/L-% IV SOLN
INTRAVENOUS | Status: DC
  Administered 2018-12-30: 22:00:00 via INTRAVENOUS
  Filled 2018-12-30: qty 1000

## 2018-12-30 MED ORDER — CHLORHEXIDINE GLUCONATE 0.12% ORAL RINSE (MEDLINE KIT)
15.0000 mL | Freq: Two times a day (BID) | OROMUCOSAL | Status: DC
  Administered 2018-12-30 – 2018-12-31 (×2): 15 mL via OROMUCOSAL

## 2018-12-30 MED ORDER — DEXAMETHASONE SODIUM PHOSPHATE 4 MG/ML IJ SOLN
4.0000 mg | Freq: Three times a day (TID) | INTRAMUSCULAR | Status: DC
  Administered 2019-01-02 (×2): 4 mg via INTRAVENOUS
  Filled 2018-12-30 (×3): qty 1

## 2018-12-30 MED ORDER — FENTANYL CITRATE (PF) 100 MCG/2ML IJ SOLN
INTRAMUSCULAR | Status: AC
  Filled 2018-12-30: qty 2

## 2018-12-30 MED ORDER — SODIUM CHLORIDE 0.9% FLUSH: 3.0000 mL | Freq: Once | INTRAVENOUS | Status: DC

## 2018-12-30 MED ORDER — ROCURONIUM BROMIDE 10 MG/ML (PF) SYRINGE
PREFILLED_SYRINGE | INTRAVENOUS | Status: DC | PRN
  Administered 2018-12-30: 25 mg via INTRAVENOUS
  Administered 2018-12-30: 50 mg via INTRAVENOUS

## 2018-12-30 MED ORDER — ACETAMINOPHEN 325 MG PO TABS: 650.0000 mg | ORAL_TABLET | ORAL | Status: DC | PRN

## 2018-12-30 MED ORDER — MICROFIBRILLAR COLL HEMOSTAT EX PADS
MEDICATED_PAD | CUTANEOUS | Status: DC | PRN
  Administered 2018-12-30: 1 via TOPICAL

## 2018-12-30 MED ORDER — ONDANSETRON HCL 4 MG/2ML IJ SOLN
INTRAMUSCULAR | Status: AC
  Filled 2018-12-30: qty 2

## 2018-12-30 MED ORDER — THROMBIN 5000 UNITS EX SOLR
OROMUCOSAL | Status: DC | PRN
  Administered 2018-12-30 (×2): 5 mL via TOPICAL

## 2018-12-30 MED ORDER — MECLIZINE HCL 25 MG PO TABS
25.0000 mg | ORAL_TABLET | Freq: Once | ORAL | Status: AC
  Administered 2018-12-30: 25 mg via ORAL
  Filled 2018-12-30: qty 1

## 2018-12-30 MED ORDER — BUPIVACAINE HCL (PF) 0.25 % IJ SOLN
INTRAMUSCULAR | Status: AC
  Filled 2018-12-30: qty 30

## 2018-12-30 MED ORDER — PANTOPRAZOLE SODIUM 40 MG IV SOLR
40.0000 mg | Freq: Every day | INTRAVENOUS | Status: DC
  Administered 2018-12-31 – 2019-01-03 (×4): 40 mg via INTRAVENOUS
  Filled 2018-12-30 (×4): qty 40

## 2018-12-30 MED ORDER — ONDANSETRON HCL 4 MG PO TABS: 4.0000 mg | ORAL_TABLET | ORAL | Status: DC | PRN

## 2018-12-30 MED ORDER — BUPIVACAINE HCL (PF) 0.25 % IJ SOLN
INTRAMUSCULAR | Status: DC | PRN
  Administered 2018-12-30: 10 mL

## 2018-12-30 MED ORDER — MIDAZOLAM HCL 2 MG/2ML IJ SOLN
INTRAMUSCULAR | Status: AC
  Filled 2018-12-30: qty 2

## 2018-12-30 MED ORDER — ONDANSETRON HCL 4 MG/2ML IJ SOLN: 4.0000 mg | INTRAMUSCULAR | Status: DC | PRN

## 2018-12-30 MED ORDER — SODIUM CHLORIDE 0.9 % IV SOLN
INTRAVENOUS | Status: DC | PRN
  Administered 2018-12-30: 18:00:00 via INTRAVENOUS

## 2018-12-30 MED ORDER — THROMBIN 5000 UNITS EX SOLR
CUTANEOUS | Status: AC
  Filled 2018-12-30: qty 10000

## 2018-12-30 MED ORDER — METOCLOPRAMIDE HCL 5 MG/ML IJ SOLN
10.0000 mg | Freq: Once | INTRAMUSCULAR | Status: AC
  Administered 2018-12-30: 10 mg via INTRAVENOUS
  Filled 2018-12-30: qty 2

## 2018-12-30 MED ORDER — MORPHINE SULFATE (PF) 2 MG/ML IV SOLN
1.0000 mg | INTRAVENOUS | Status: DC | PRN
  Administered 2019-01-03 – 2019-01-04 (×4): 2 mg via INTRAVENOUS
  Filled 2018-12-30 (×4): qty 1

## 2018-12-30 MED ORDER — FENTANYL CITRATE (PF) 250 MCG/5ML IJ SOLN
INTRAMUSCULAR | Status: DC | PRN
  Administered 2018-12-30: 150 ug via INTRAVENOUS
  Administered 2018-12-30: 100 ug via INTRAVENOUS

## 2018-12-30 MED ORDER — LABETALOL HCL 5 MG/ML IV SOLN: 20.0000 mg | Freq: Once | INTRAVENOUS | Status: DC

## 2018-12-30 MED ORDER — THROMBIN 20000 UNITS EX KIT
PACK | CUTANEOUS | Status: AC
  Filled 2018-12-30: qty 1

## 2018-12-30 MED ORDER — THROMBIN 20000 UNITS EX SOLR
CUTANEOUS | Status: DC | PRN
  Administered 2018-12-30: 20 mL via TOPICAL

## 2018-12-30 MED ORDER — FENTANYL CITRATE (PF) 250 MCG/5ML IJ SOLN
INTRAMUSCULAR | Status: AC
  Filled 2018-12-30: qty 5

## 2018-12-30 MED ORDER — METOPROLOL SUCCINATE ER 50 MG PO TB24: 50.0000 mg | ORAL_TABLET | Freq: Every day | ORAL | Status: DC

## 2018-12-30 MED ORDER — PROTHROMBIN COMPLEX CONC HUMAN 500 UNITS IV KIT
3307.0000 [IU] | PACK | Status: AC
  Administered 2018-12-30: 3307 [IU] via INTRAVENOUS
  Filled 2018-12-30: qty 3307

## 2018-12-30 MED ORDER — CHLORHEXIDINE GLUCONATE CLOTH 2 % EX PADS
6.0000 | MEDICATED_PAD | Freq: Every day | CUTANEOUS | Status: DC
  Administered 2018-12-30: 6 via TOPICAL

## 2018-12-30 MED ORDER — HYDRALAZINE HCL 20 MG/ML IJ SOLN
5.0000 mg | Freq: Once | INTRAMUSCULAR | Status: AC
  Administered 2018-12-30: 5 mg via INTRAVENOUS
  Filled 2018-12-30: qty 1

## 2018-12-30 MED ORDER — PROPOFOL 1000 MG/100ML IV EMUL
0.0000 ug/kg/min | INTRAVENOUS | Status: DC
  Administered 2018-12-31: 40 ug/kg/min via INTRAVENOUS
  Filled 2018-12-30: qty 100

## 2018-12-30 MED ORDER — DIPHENHYDRAMINE HCL 50 MG/ML IJ SOLN
25.0000 mg | Freq: Once | INTRAMUSCULAR | Status: AC
  Administered 2018-12-30: 25 mg via INTRAVENOUS
  Filled 2018-12-30: qty 1

## 2018-12-30 MED ORDER — LOSARTAN POTASSIUM 50 MG PO TABS: 100.0000 mg | ORAL_TABLET | Freq: Every day | ORAL | Status: DC

## 2018-12-30 MED ORDER — LABETALOL HCL 5 MG/ML IV SOLN
10.0000 mg | INTRAVENOUS | Status: DC | PRN
  Administered 2019-01-01: 20 mg via INTRAVENOUS
  Administered 2019-01-02: 10 mg via INTRAVENOUS
  Filled 2018-12-30 (×3): qty 4

## 2018-12-30 MED ORDER — ORAL CARE MOUTH RINSE
15.0000 mL | OROMUCOSAL | Status: DC
  Administered 2018-12-31 (×5): 15 mL via OROMUCOSAL

## 2018-12-30 MED ORDER — DEXAMETHASONE SODIUM PHOSPHATE 10 MG/ML IJ SOLN
6.0000 mg | Freq: Four times a day (QID) | INTRAMUSCULAR | Status: AC
  Administered 2018-12-31 (×4): 6 mg via INTRAVENOUS
  Filled 2018-12-30 (×4): qty 1

## 2018-12-30 SURGICAL SUPPLY — 64 items
BAG DECANTER FOR FLEXI CONT (MISCELLANEOUS) ×3 IMPLANT
BLADE CLIPPER SPEC (BLADE) IMPLANT
BUR ACORN 9.0 PRECISION (BURR) ×2 IMPLANT
BUR ACORN 9.0MM PRECISION (BURR) ×1
BUR MATCHSTICK NEURO 3.0 LAGG (BURR) IMPLANT
CABLE BIPOLOR RESECTION CORD (MISCELLANEOUS) ×3 IMPLANT
CANISTER SUCT 3000ML PPV (MISCELLANEOUS) ×6 IMPLANT
CARTRIDGE OIL MAESTRO DRILL (MISCELLANEOUS) ×1 IMPLANT
CLIP VESOCCLUDE MED 6/CT (CLIP) IMPLANT
COVER WAND RF STERILE (DRAPES) ×3 IMPLANT
DIFFUSER DRILL AIR PNEUMATIC (MISCELLANEOUS) ×3 IMPLANT
DRAPE LAPAROTOMY 100X72 PEDS (DRAPES) ×3 IMPLANT
DRAPE MICROSCOPE LEICA (MISCELLANEOUS) IMPLANT
DRAPE POUCH INSTRU U-SHP 10X18 (DRAPES) ×3 IMPLANT
DRAPE WARM FLUID 44X44 (DRAPES) ×3 IMPLANT
DRSG OPSITE POSTOP 4X6 (GAUZE/BANDAGES/DRESSINGS) ×3 IMPLANT
ELECT CAUTERY BLADE 6.4 (BLADE) ×3 IMPLANT
ELECT REM PT RETURN 9FT ADLT (ELECTROSURGICAL) ×3
ELECTRODE REM PT RTRN 9FT ADLT (ELECTROSURGICAL) ×1 IMPLANT
FORCEPS BIPOLAR SPETZLER 8 1.0 (NEUROSURGERY SUPPLIES) ×3 IMPLANT
GAUZE 4X4 16PLY RFD (DISPOSABLE) IMPLANT
GAUZE SPONGE 4X4 12PLY STRL (GAUZE/BANDAGES/DRESSINGS) ×3 IMPLANT
GLOVE BIO SURGEON STRL SZ 6.5 (GLOVE) ×2 IMPLANT
GLOVE BIO SURGEON STRL SZ7 (GLOVE) ×9 IMPLANT
GLOVE BIO SURGEON STRL SZ8 (GLOVE) ×3 IMPLANT
GLOVE BIO SURGEONS STRL SZ 6.5 (GLOVE) ×1
GLOVE BIOGEL PI IND STRL 7.0 (GLOVE) ×1 IMPLANT
GLOVE BIOGEL PI IND STRL 7.5 (GLOVE) ×1 IMPLANT
GLOVE BIOGEL PI INDICATOR 7.0 (GLOVE) ×2
GLOVE BIOGEL PI INDICATOR 7.5 (GLOVE) ×2
GOWN STRL REUS W/ TWL LRG LVL3 (GOWN DISPOSABLE) ×3 IMPLANT
GOWN STRL REUS W/ TWL XL LVL3 (GOWN DISPOSABLE) IMPLANT
GOWN STRL REUS W/TWL 2XL LVL3 (GOWN DISPOSABLE) ×3 IMPLANT
GOWN STRL REUS W/TWL LRG LVL3 (GOWN DISPOSABLE) ×6
GOWN STRL REUS W/TWL XL LVL3 (GOWN DISPOSABLE)
HEMOSTAT POWDER KIT SURGIFOAM (HEMOSTASIS) IMPLANT
HOOK DURA (MISCELLANEOUS) ×3 IMPLANT
KIT BASIN OR (CUSTOM PROCEDURE TRAY) ×3 IMPLANT
KIT TURNOVER KIT B (KITS) ×3 IMPLANT
NEEDLE HYPO 22GX1.5 SAFETY (NEEDLE) ×3 IMPLANT
NS IRRIG 1000ML POUR BTL (IV SOLUTION) ×3 IMPLANT
OIL CARTRIDGE MAESTRO DRILL (MISCELLANEOUS) ×3
PACK LAMINECTOMY NEURO (CUSTOM PROCEDURE TRAY) ×3 IMPLANT
PAD ARMBOARD 7.5X6 YLW CONV (MISCELLANEOUS) ×9 IMPLANT
PATTIES SURGICAL .25X.25 (GAUZE/BANDAGES/DRESSINGS) IMPLANT
PATTIES SURGICAL .5 X.5 (GAUZE/BANDAGES/DRESSINGS) IMPLANT
PATTIES SURGICAL .5 X3 (DISPOSABLE) IMPLANT
PATTIES SURGICAL 1X1 (DISPOSABLE) IMPLANT
PIN MAYFIELD SKULL DISP (PIN) IMPLANT
RUBBERBAND STERILE (MISCELLANEOUS) IMPLANT
SPONGE NEURO XRAY DETECT 1X3 (DISPOSABLE) IMPLANT
STAPLER VISISTAT 35W (STAPLE) IMPLANT
SUT ETHILON 3 0 FSL (SUTURE) IMPLANT
SUT NURALON 4 0 TR CR/8 (SUTURE) ×6 IMPLANT
SUT VIC AB 0 CT1 18XCR BRD8 (SUTURE) ×2 IMPLANT
SUT VIC AB 0 CT1 8-18 (SUTURE) ×4
SUT VIC AB 2-0 CP2 18 (SUTURE) ×3 IMPLANT
SUT VIC AB 3-0 SH 8-18 (SUTURE) ×3 IMPLANT
SYR CONTROL 10ML LL (SYRINGE) ×3 IMPLANT
TOWEL GREEN STERILE (TOWEL DISPOSABLE) ×3 IMPLANT
TOWEL GREEN STERILE FF (TOWEL DISPOSABLE) ×3 IMPLANT
TRAY FOLEY MTR SLVR 16FR STAT (SET/KITS/TRAYS/PACK) IMPLANT
UNDERPAD 30X30 (UNDERPADS AND DIAPERS) IMPLANT
WATER STERILE IRR 1000ML POUR (IV SOLUTION) ×3 IMPLANT

## 2018-12-30 NOTE — ED Notes (Signed)
Patient transported to CT 

## 2018-12-30 NOTE — Progress Notes (Signed)
eLink Physician-Brief Progress Note Patient Name: Randy [name] DOB: Nov 04, 1945 MRN: [id number]   Date of Service  01/04/2019  HPI/Events of Note  73 year old man with HTN, CAD, PE on xarelto and now admitted to ICU with intracranial hemorrhage. S/p evacuation and is intubated, sedated in ICU on propofol and meds for BP control  eICU Interventions  Check ABG on vent - reviewed CXR Notified bedside ICU team      Intervention Category Minor Interventions: Clinical assessment - ordering diagnostic tests Evaluation Type: Other  [name] 12/26/2018, 11:14 PM

## 2018-12-30 NOTE — Op Note (Signed)
12/29/2018  8:07 PM  PATIENT:  Randy [name]  73 y.o. male  PRE-OPERATIVE DIAGNOSIS: Large left cerebellar hemorrhage  POST-OPERATIVE DIAGNOSIS:  same  PROCEDURE: Suboccipital craniectomy for evacuation of cerebellar hemorrhage  SURGEON:  Sherley Bounds, MD  ASSISTANTS: Glenford Peers FNP  ANESTHESIA:   General  EBL: 50 ml  Total I/O In: 739 [I.V.:90; Blood:649] Out: 425 [Urine:375; Blood:50]  BLOOD ADMINISTERED: none  DRAINS: none  SPECIMEN:  none  INDICATION FOR PROCEDURE: This patient presented with somnolence during episode of dizziness and hypertension. Imaging showed large left cerebellar hematoma.  His coagulopathy was reversed with Kcentra and COVID test came back negative and he was taken emergently to the operating room after obtaining informed consent from his brother for suboccipital craniectomy for evacuation of cerebellar hematoma. Patient's family understood the risks, benefits, and alternatives and potential outcomes and wished to proceed.  PROCEDURE DETAILS: The patient was brought to the operating room. Generalized endotracheal anesthesia was induced. The patient was affixed a 3 point [place] headrest and rolled into the prone position on chest rolls. All pressure points were padded. The posterior cervical region and suboccipital region was cleaned and prepped with DuraPrep and then draped in the usual sterile fashion. 10 cc of local anesthesia was injected and a dorsal midline incision made from the inion down to the posterior cervical region and carried down to the cervical fascia. The fascia was opened and the paraspinous musculature was taken down to expose the subcapital region from the inion down to the ring of C1.  We used a high-speed drill and the bur to create a large craniectomy in the left suboccipital region.  This exposed the dura.  Dura was opened in a cruciate fashion and there was immediate release of some epidural blood.  We performed a corticectomy the  bipolars and about a centimeter deep found a large liquid and solid hematoma under significant pressure.  This was removed with suction and bipolar cautery.  Then lined the wall with adenoids and continue to dry the wall with nonstick bipolar.  There was significant bleeding medially in the midline.  This was controlled with bipolar cautery.  Once we had a reasonably dry bed we lined it with Surgifoam.  We packed this and waited.  We then irrigated this away.  We found no significant bleeding.  The brain was slack.  We lined the walls of the surgical bed with Surgicel. I irrigated with saline solution. I placed some 4-0 Ethilon sutures into the dura but obviously could not get a watertight closure.  I lined the dura with Gelfoam. After hemostasis was achieved I closed the muscle and the fascia with 0 Vicryl, subcutaneous tissue with 2-0 Vicryl, and the subcuticular tissue with 3-0 Vicryl. The skin was closed with staples. A sterile dressing was applied, the patient was turned to the supine position and taken out of the headrest,  and transferred to the recovery room in critical condition as he waited in ICU bed to be ready. At the end of the procedure all sponge, needle and instrument counts were correct.   PLAN OF CARE: Admit to inpatient   PATIENT DISPOSITION:  ICU - intubated and critically ill.   Delay start of Pharmacological VTE agent (>24hrs) due to surgical blood loss or risk of bleeding:  yes

## 2018-12-30 NOTE — H&P (Signed)
Subjective: Patient is a 73 y.o. male admitted for large left cerebellar hemorrhage. Onset of symptoms was a few hours ago, gradually worsening since that time.  He has multiple medical problems including alcohol abuse and PE treated with Xarelto.  His last INR was 1.6.  His last Xarelto dose was yesterday.  He is receiving Kcentra and getting a rapid COVID test.  He is somnolent with sonorous respirations and therefore cannot great with history and physical exam.  Symptoms started with severe dizziness and he was to the emergency department with hypotension and pallor and nausea and vomiting.  MRI or CT showed large left cerebellar hemorrhage with intraventricular extension without hydrocephalus.  ED is attempting intubation now.  Past Medical History:  Diagnosis Date  . Alcohol abuse    Last drink was 10 years ago (2009).  Marland Kitchen Hypertension   . NSTEMI (non-ST elevated myocardial infarction) ([place]) 06/29/2018   s/p CABG w/ LIMA-LAD, SVG-diagonal, SVG-OM, SVG-PLV  . PAF (paroxysmal atrial fibrillation) ([place]) 06/2018   Postop CABG  . Pulmonary embolus ([place]) 06/29/2018  . Tobacco abuse     Past Surgical History:  Procedure Laterality Date  . CORONARY ARTERY BYPASS GRAFT N/A 07/01/2018   Procedure: CORONARY ARTERY BYPASS GRAFTING (CABG) x 4, LIMA TO LAD, SVG TO OM1, SVG TO DIAG, SVG TO PL, USING LEFT INTERNAL MAMMARY ARTERY AND RIGHT GREATER SAPHENOUS VEIN HARVESTED ENDOSCOPICALLY;  Surgeon: Grace Isaac, MD;  Location: [place];  Service: Open Heart Surgery;  Laterality: N/A;  . IABP INSERTION Right 06/29/2018   Procedure: IABP Insertion;  Surgeon: Sherren Mocha, MD;  Location: [place] [facility];  Service: Cardiovascular;  Laterality: Right;  . LEFT HEART CATH AND CORONARY ANGIOGRAPHY N/A 06/29/2018   Procedure: LEFT HEART CATH AND CORONARY ANGIOGRAPHY;  Surgeon: Sherren Mocha, MD;  Location: [place] [facility];  Service: Cardiovascular;  Laterality: N/A;  . TEE WITHOUT CARDIOVERSION N/A  07/01/2018   Procedure: TRANSESOPHAGEAL ECHOCARDIOGRAM (TEE);  Surgeon: Grace Isaac, MD;  Location: [place];  Service: Open Heart Surgery;  Laterality: N/A;    Prior to Admission medications   Medication Sig Start Date End Date Taking? Authorizing Provider  aspirin EC 81 MG EC tablet Take 1 tablet (81 mg total) by mouth daily. 07/07/18  Yes Barrett, Erin R, PA-C  atorvastatin (LIPITOR) 80 MG tablet Take 1 tablet (80 mg total) by mouth daily at 6 PM. 08/06/18  Yes Barrett, Evelene Croon, PA-C  losartan (COZAAR) 100 MG tablet Take 1 tablet (100 mg total) by mouth daily. 08/06/18  Yes Barrett, Evelene Croon, PA-C  metoprolol succinate (TOPROL-XL) 50 MG 24 hr tablet Take 1 tablet (50 mg total) by mouth daily. Take with or immediately following a meal. 08/06/18  Yes Barrett, Evelene Croon, PA-C  rivaroxaban (XARELTO) 20 MG TABS tablet Take 20 mg by mouth daily with supper.   Yes [provider]   No Known Allergies  Social History   Tobacco Use  . Smoking status: Former Smoker    Packs/day: 0.50    Years: 50.00    Pack years: 25.00    Types: Cigarettes    Quit date: 06/29/2018    Years since quitting: 0.5  . Smokeless tobacco: Never Used  Substance Use Topics  . Alcohol use: Not Currently    Family History  Problem Relation Age of Onset  . CAD Father        died from a "massive heart attack" in his mid 59's  . Stroke Neg Hx   .  Sudden death Neg Hx      Review of Systems  Positive ROS: Unable to obtain  All other systems have been reviewed and were otherwise negative with the exception of those mentioned in the HPI and as above.  Objective: Vital signs in last 24 hours: Pulse Rate:  [49-53] 49 (06/17 1500) Resp:  [14-19] 15 (06/17 1500) BP: (190-202)/(90-95) 202/95 (06/17 1500) SpO2:  [94 %-98 %] 94 % (06/17 1500) Weight:  [68 kg] 68 kg (06/17 1352)  General Appearance: Obtunded with sonorous respirations Head: Normocephalic, without obvious abnormality, atraumatic Eyes:  PERRL, conjunctiva/corneas clear   Neck: Supple Lungs:  respirations labored Heart: Tachycardic  NEUROLOGIC:   Mental status: Obtunded, fund of knowledge, and memory unable to test Motor Exam -mostly moves all 4 extremities Sensory Exam -unable to test Reflexes: Not tested Coordination -unable to test Gait -unable to test Balance -unable to test Cranial Nerves: I: smell Not tested  II: visual acuity    II: visual fields   II: pupils   III,VII: ptosis   III,IV,VI: extraocular muscles    V: mastication   V: facial light touch sensation    V,VII: corneal reflex    VII: facial muscle function - upper    VII: facial muscle function - lower   VIII: hearing   IX: soft palate elevation    IX,X: gag reflex   XI: trapezius strength    XI: sternocleidomastoid strength   XI: neck flexion strength    XII: tongue strength      Data Review Lab Results  Component Value Date   WBC 14.8 (H) 12/23/2018   HGB 17.0 12/22/2018   HCT 50.0 12/16/2018   MCV 91.9 12/26/2018   PLT 233 01/09/2019   Lab Results  Component Value Date   NA 137 12/20/2018   K 4.9 01/11/2019   CL 105 12/28/2018   CO2 19 (L) 12/24/2018   BUN 22 12/15/2018   CREATININE 1.00 12/20/2018   GLUCOSE 139 (H) 01/09/2019   Lab Results  Component Value Date   INR 1.6 (H) 12/28/2018    Assessment/Plan:  Estimated body mass index is 25.75 kg/m as calculated from the following:   Height as of this encounter: 5\' 4"  (1.626 m).   Weight as of this encounter: 68 kg. Patient admitted for emergent suboccipital craniectomy for evacuation of large cerebellar hemorrhage.  I do not believe he needs a ventriculostomy at that time.   He is receiving Kcentra but obviously there is still significant increased risk of hemorrhage during surgery.  It may be difficult to stop the bleeding in the cerebellum.  This is all been explained to the patient's family member.   I explained the condition and procedure to the patient's  brother and answered any questions.  Patient's brother wishes to proceed with procedure as planned. Understands risks/ benefits and typical outcomes of procedure.   Eustace Moore 12/28/2018 5:28 PM

## 2018-12-30 NOTE — Transfer of Care (Signed)
Immediate Anesthesia Transfer of Care Note  Patient: Randy [name]  Procedure(s) Performed: SUBOCCIPITAL CRANIECTOMY FOR CEREBELLAR HEMATOMA (N/A Head)  Patient Location: PACU  Anesthesia Type:General  Level of Consciousness: sedated, unresponsive and Patient remains intubated per anesthesia plan  Airway & Oxygen Therapy: Patient remains intubated per anesthesia plan and Patient placed on Ventilator (see vital sign flow sheet for setting)  Post-op Assessment: Report given to RN and Post -op Vital signs reviewed and stable  Post vital signs: Reviewed and stable  Last Vitals:  Vitals Value Taken Time  BP 136/68 12/27/2018 2011  Temp    Pulse 62 12/19/2018 2014  Resp 20 01/01/2019 2014  SpO2 100 % 12/19/2018 2014  Vitals shown include unvalidated device data.  Last Pain:  Vitals:   12/14/2018 1507  PainSc: 5          Complications: No apparent anesthesia complications

## 2018-12-30 NOTE — H&P (Signed)
Neurology history and physical     History is obtained from: Chart/nurse/patient  HPI: Randy [name] is a 73 y.o. male history of tobacco abuse, pulmonary embolism, paroxysmal atrial fibrillation, hypertension, alcohol abuse.  Patient also has coagulopathy in the setting of Xarelto and ?From prior alcoholism.  Patient apparently was in [place] this afternoon when he suddenly felt dizzy and diaphoretic.  Patient states that he felt he was in a pass out but sat down.  EMS was called immediately.  Patient was immediately brought to [facility] and noted to be bradycardic in the 30s to 40s.  Initially it was thought to be cardiac related however after CT head was done couple of hours after arrival, it showed a large cerebellar bleed.  Neurology was consulted immediately.  Upon entering the room patient's blood pressure was extremely elevated in the 200s and nurse mentioned that he was quickly decompensating.  Apparently he was able to talk and give a good history upon entering the emergency department but at this point he is breathing heavily and having difficulty giving any history.  CT head was immediately obtained and showed an acute intraparenchymal hemorrhage within the left cerebellum with volume of 28 mL and intraventricular extension to the third and fourth ventricle.  There was also marked posterior fossa mass-effect and it is noted that he likely does have obstructive hydrocephalus developing.  Due to the decompensation patient was immediately brought back to CT scanner to obtain a CTA to rule out any aneurysmal or vascular malformations. Last dose of Xarelto was 24 hours thus reversing with [facility].   At baseline he is able to do everything himself.  MRs is 0   LKW: Approximately 1300 hrs. today tpa given?: no, intracranial hemorrhage Premorbid modified Rankin scale (mRS): 0 NIH stroke scale 1a Level of Conscious.: 1 1b LOC Questions: 0 1c LOC Commands: 0 2 Best Gaze: 0 3 Visual: 0 4  Facial Palsy: 0 5a Motor Arm - left: 0 5b Motor Arm - Right: 0 6a Motor Leg - Left: 0 6b Motor Leg - Right: 0 7 Limb Ataxia: 0 8 Sensory: 0 9 Best Language: 1 10 Dysarthria: 2 11 Extinct. and Inatten.: 0 TOTAL:  ICH Score: 2   ROS:  Unable to obtain due deterioration of mental status  Past Medical History:  Diagnosis Date  . Alcohol abuse    Last drink was 10 years ago (2009).  Marland Kitchen Hypertension   . NSTEMI (non-ST elevated myocardial infarction) ([place]) 06/29/2018   s/p CABG w/ LIMA-LAD, SVG-diagonal, SVG-OM, SVG-PLV  . PAF (paroxysmal atrial fibrillation) ([place]) 06/2018   Postop CABG  . Pulmonary embolus ([place]) 06/29/2018  . Tobacco abuse      Family History  Problem Relation Age of Onset  . CAD Father        died from a "massive heart attack" in his mid 29's  . Stroke Neg Hx   . Sudden death Neg Hx    Social History:   reports that he quit smoking about 6 months ago. His smoking use included cigarettes. He has a 25.00 pack-year smoking history. He has never used smokeless tobacco. He reports previous alcohol use. He reports that he does not use drugs.  Medications  Current Facility-Administered Medications:  .  clevidipine (CLEVIPREX) infusion 0.5 mg/mL, 0-21 mg/hr, Intravenous, Continuous, Deno Etienne, DO, Last Rate: 2 mL/hr at 01/06/2019 1617, 1 mg/hr at 01/09/2019 1617 .  iohexol (OMNIPAQUE) 350 MG/ML injection 100 mL, 100 mL, Intravenous, Once PRN, Shirlyn Goltz  Hsienta, MD .  prothrombin complex conc human (KCENTRA) IVPB 3,307 Units, 3,307 Units, Intravenous, STAT, Tyrone Nine, Dan, DO .  sodium chloride flush (NS) 0.9 % injection 3 mL, 3 mL, Intravenous, Once, Drenda Freeze, MD  Current Outpatient Medications:  .  aspirin EC 81 MG EC tablet, Take 1 tablet (81 mg total) by mouth daily., Disp: , Rfl:  .  atorvastatin (LIPITOR) 80 MG tablet, Take 1 tablet (80 mg total) by mouth daily at 6 PM., Disp: 90 tablet, Rfl: 1 .  losartan (COZAAR) 100 MG tablet, Take 1 tablet (100 mg  total) by mouth daily., Disp: 90 tablet, Rfl: 1 .  metoprolol succinate (TOPROL-XL) 50 MG 24 hr tablet, Take 1 tablet (50 mg total) by mouth daily. Take with or immediately following a meal., Disp: 90 tablet, Rfl: 1 .  rivaroxaban (XARELTO) 20 MG TABS tablet, Take 20 mg by mouth daily with supper., Disp: , Rfl:    Exam: Current vital signs: BP (!) 202/95   Pulse (!) 49   Resp 15   Ht 5\' 4"  (1.626 m)   Wt 68 kg   SpO2 94%   BMI 25.75 kg/m  Vital signs in last 24 hours: Pulse Rate:  [49-53] 49 (06/17 1500) Resp:  [14-19] 15 (06/17 1500) BP: (190-202)/(90-95) 202/95 (06/17 1500) SpO2:  [94 %-98 %] 94 % (06/17 1500) Weight:  [68 kg] 68 kg (06/17 1352)  Physical Exam  Constitutional: Appears well-developed and well-nourished.  Psych: Currently appears distressed Eyes: No scleral injection HENT: No OP obstrucion Head: Normocephalic.  Cardiovascular: Normal rate and regular rhythm.  Respiratory: Effort normal, non-labored breathing GI: Soft.  No distension. There is no tenderness.  Skin: WDI  Neuro: Mental Status: Patient is awake, alert, and difficulty giving any history secondary to having difficulty with breathing and also in distress. Patient currently unable to give a clear and coherent history No signs of aphasia  Cranial Nerves: II: Blinks to threat bilaterally III,IV, VI: EOMI without ptosis or diploplia. Pupils equal, round and reactive to light V: Facial sensation is symmetric to temperature VII: Left facial droop with air leak on the left aspect of mouth  Motor: Moving all extremities antigravity Sensory: Sensation is symmetric to light touch and temperature in the arms and legs. Deep Tendon Reflexes: 2+ and symmetric in the biceps and patellae.  Plantars: Toes are downgoing bilaterally.  Cerebellar: Currently not tested as patient is in distress  Labs I have reviewed labs in epic and the results pertinent to this consultation are:   CBC    Component  Value Date/Time   WBC 14.8 (H) 12/19/2018 1338   RBC 5.32 01/05/2019 1338   HGB 17.0 12/27/2018 1345   HCT 50.0 01/04/2019 1345   PLT 233 12/29/2018 1338   MCV 91.9 01/05/2019 1338   MCH 29.9 12/26/2018 1338   MCHC 32.5 12/21/2018 1338   RDW 14.8 12/26/2018 1338   LYMPHSABS 1.2 06/29/2018 1035   MONOABS 1.1 (H) 06/29/2018 1035   EOSABS 0.0 06/29/2018 1035   BASOSABS 0.0 06/29/2018 1035    CMP     Component Value Date/Time   NA 137 01/11/2019 1345   NA 137 08/18/2018 0947   K 4.9 12/16/2018 1345   CL 105 12/25/2018 1345   CO2 19 (L) 12/20/2018 1339   GLUCOSE 139 (H) 12/27/2018 1345   BUN 22 01/09/2019 1345   BUN 23 08/18/2018 0947   CREATININE 1.00 01/01/2019 1345   CALCIUM 9.2 12/18/2018 1339   PROT 6.8  12/28/2018 1339   PROT 6.2 08/18/2018 0947   ALBUMIN 4.1 01/01/2019 1339   ALBUMIN 4.3 08/18/2018 0947   AST 34 01/06/2019 1339   ALT 30 12/29/2018 1339   ALKPHOS 41 01/09/2019 1339   BILITOT 1.2 12/27/2018 1339   BILITOT 0.7 08/18/2018 0947   GFRNONAA >60 01/02/2019 1339   GFRAA >60 12/18/2018 1339    Imaging I have reviewed the images obtained:  CT-scan of the brain - Initial: Acute intraparenchymal hemorrhage within the left cerebellum with volume of 20 mL and intraventricular extension to third and fourth ventricle.  [place] posterior fossa mass-effect without current hydrocephalus however it is likely that he has obstructive hydrocephalus developing.  CTA of head: Severely motion degraded.  MRI examination of the brain-pending  Etta Quill PA-C [facility] [phone]  M-F  (9:00 am- 5:00 PM)  12/18/2018, 4:31 PM   Attending addendum . Patient seen and examined. History obtained from brother Khadim Lundberg over the phone. Patient unable to provide history. Examination performed independently, agree with the documentation of Etta Quill as it reflects my examination findings that I confirmed with him. I have independently reviewed  imaging-large right cerebellar bleed with intraventricular extension into the third and the fourth ventricle and mass-effect and mild hydro-  Assessment:  This is a 73 year old male with acute intraparenchymal hemorrhage on the left with intraventricular extension.  Patient was immediately brought to the emergency department and while in the [facility] department patient started to decline and spiked a elevated blood pressure and the systolic 453M.  Patient was started on Cleviprex and due to being on Xarelto (last dose 24 hours ago ) this was reversed with [facility].   Impression:  Right cerebellar ICH IVH Coagulopathy due to Xarelto Evaluate for alcoholic liver disease Hydrocephalus Cerebral edema   Plan: Cerebellum ICH, nontraumatic IVH, nontraumatic  Acuity: Acute Laterality: Current suspected etiology:   Hypertension and coagulopathy Treatment: -Admit to neurological ICU -ICH Score: 3 -ICH Volume: 28 -BP control goal SYS<140 -NSGY Consult for hematoma evacuation-Dr. Ronnald Ramp on board and patient is being taken to the OR. -PT/OT/ST  -neuromonitoring  CNS Cerebral edema -Hyperosmolar therapy-start 3% saline -NSGY consult-appreciate recommendations and prompt surgical intervention -Close neuro monitoring  Obstructive hydrocephalus -Going to OR -monitor  Dysarthria Dysphagia following ICH  -NPO until cleared by speech -ST -Advance diet as tolerated  Toxic encephalopathy Anoxic encephalopathy -Correct metabolic causes -Monitor   -Continue PT/OT/ST  RESP Acute Respiratory Failure  -vent management per ICU -wean when able  History of PE -Hold Xarelto for now. -Management per PCCM.  CV Hypertensive Emergency Hypertensive Urgency -Aggressive BP control, goal SBP < 140 -Labetalol, hydralazine IV as needed, use Cleviprex drip.  Paroxysmal atrial -fibrillation -Hold on antiplatelets and anticoagulants  NSTEMI -Cards Consult  GI/GU No acute issues Gentle  hydration  HEME Coagulopathy secondary to anticoagulation with Xarelto -Reverse with PCC-Andexxa not used because last dose was greater than 24 hours ago, but less than 48 hours ago. 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 Type 2 diabetes mellitus with hyperglycemia  -SSI -Start oral meds -goal HgbA1c < 7  Fluid/Electrolyte Disorders Repeat labs and replete as necessary  ID Possible Aspiration PNA -CXR -NPO -Monitor  Prophylaxis DVT: SCDs GI: PPI Bowel: Docusate senna  Dispo: IP Rehab   Diet: NPO h  Code Status: Full Code    THE FOLLOWING WERE PRESENT ON ADMISSION: ICH Cerebral edema Obstructive hydrocephalus History of coronary artery disease   -- Amie Portland, MD [facility] Pager: [phone] If 7pm to 7am, please call on call as listed on AMION.  CRITICAL CARE ATTESTATION Performed by: Amie Portland, MD Total critical care time: 60 minutes Critical care time was exclusive of separately billable procedures and treating other patients and/or supervising APPs/Residents/Students Critical care was necessary to treat or prevent imminent or life-threatening deterioration due to intracerebral hemorrhage, hydrocephalus, intraventricular hemorrhage This patient is critically ill and at significant risk for neurological worsening and/or death and care requires constant monitoring. Critical care was time spent personally by me on the following activities: development of treatment plan with patient and/or surrogate as well as nursing, discussions with consultants, evaluation of patient's response to treatment, examination of patient, obtaining history from patient or surrogate, ordering and performing treatments and interventions, ordering and review of laboratory studies, ordering and review of radiographic studies, pulse oximetry, re-evaluation of patient's condition, participation in multidisciplinary rounds and medical decision  making of high complexity in the care of this patient.

## 2018-12-30 NOTE — ED Provider Notes (Signed)
73 yo M with a chief complaint of sudden onset vertigo.  This happened while he was walking and grocery store.  Unable to continue walking.  I received the patient in signout from Dr. Darl Householder, plan is for a CT Angie of the head and neck followed by a likely MRI if negative.  Patient found to have a very large cerebellar hemorrhage thought to be hypertensive by radiology.  I discussed the case with Dr. Malen Gauze, neurology he recommended a CT angiogram to rule out an AVM or mass.  He recommended that I discussed the case with neurosurgery as this may require evacuation.  Patient also was noted to be hypertensive, started on a Cleviprex drip.  He is on Xarelto for stroke prophylaxis will reverse.  Patient's mental status quickly declined.  Decision made to take him to the OR for neurosurgical procedure.  Cover test was sent patient was intubated.  Procedure Name: Intubation Date/Time: 12/31/2018 6:49 PM Performed by: Deno Etienne, DO Pre-anesthesia Checklist: Patient identified and Timeout performed Oxygen Delivery Method: Non-rebreather mask Preoxygenation: Pre-oxygenation with 100% oxygen Induction Type: Rapid sequence Laryngoscope Size: Glidescope Grade View: Grade I Tube size: 7.5 mm Number of attempts: 1 Airway Equipment and Method: Video-laryngoscopy Placement Confirmation: ETT inserted through vocal cords under direct vision Secured at: 25 cm Tube secured with: ETT holder Dental Injury: Teeth and Oropharynx as per pre-operative assessment  Difficulty Due To: Difficulty was anticipated Future Recommendations: Recommend- induction with short-acting agent, and alternative techniques readily available        CRITICAL CARE Performed by: Cecilio Asper   Total critical care time: 80 minutes  Critical care time was exclusive of separately billable procedures and treating other patients.  Critical care was necessary to treat or prevent imminent or life-threatening  deterioration.  Critical care was time spent personally by me on the following activities: development of treatment plan with patient and/or surrogate as well as nursing, discussions with consultants, evaluation of patient's response to treatment, examination of patient, obtaining history from patient or surrogate, ordering and performing treatments and interventions, ordering and review of laboratory studies, ordering and review of radiographic studies, pulse oximetry and re-evaluation of patient's condition.    Deno Etienne, DO 01/03/2019 1853

## 2018-12-30 NOTE — Progress Notes (Signed)
Portable abdominal x-ray done for OGT placement.  OGT advanced 10cm and secured.  OGT to LIWS at 26mmHg with green, bilious return.

## 2018-12-30 NOTE — ED Triage Notes (Signed)
To ED via [facility] from [place]-- pt was grocery shopping and became very dizzy, diaphoretic, upon EMS arrival- pt pulse was in the 30s, was gray, cold, diaphoretic.  On arrival here, pt was cold, diaphoretic, vomiting, pale--  Received a 500cc bolus of NS per EMS

## 2018-12-30 NOTE — Anesthesia Preprocedure Evaluation (Signed)
Anesthesia Evaluation  Patient identified by MRN, date of birth, ID band Patient awake    Reviewed: Allergy & Precautions, NPO status , Patient's Chart, lab work & pertinent test results  Airway Mallampati: Intubated       Dental   Pulmonary former smoker,    Pulmonary exam normal        Cardiovascular hypertension, Pt. on medications + CAD, + Past MI and + CABG  Normal cardiovascular exam+ dysrhythmias Atrial Fibrillation      Neuro/Psych    GI/Hepatic   Endo/Other    Renal/GU      Musculoskeletal   Abdominal   Peds  Hematology   Anesthesia Other Findings   Reproductive/Obstetrics                             Anesthesia Physical Anesthesia Plan  ASA: IV and emergent  Anesthesia Plan: General   Post-op Pain Management:    Induction: Intravenous  PONV Risk Score and Plan: 2 and Treatment may vary due to age or medical condition  Airway Management Planned: Oral ETT  Additional Equipment: Arterial line  Intra-op Plan:   Post-operative Plan: Post-operative intubation/ventilation  Informed Consent: I have reviewed the patients History and Physical, chart, labs and discussed the procedure including the risks, benefits and alternatives for the proposed anesthesia with the patient or authorized representative who has indicated his/her understanding and acceptance.       Plan Discussed with: CRNA and Surgeon  Anesthesia Plan Comments:         Anesthesia Quick Evaluation

## 2018-12-30 NOTE — Progress Notes (Signed)
No pre-op assessment performed by circulator due to emergent status of case and patient altered mental status

## 2018-12-30 NOTE — Consult Note (Signed)
NAME:  Randy [name], MRN:  [id number], DOB:  [birth year], LOS: 0 ADMISSION DATE:  12/25/2018, CONSULTATION DATE: June 17 REFERRING MD: Dr. Rory Percy, CHIEF COMPLAINT: Intraparenchymal hemorrhage  Brief History   73 year old male anticoagulated with Xarelto presented with intraparenchymal hematoma and was emergently taken to the OR for evacuation.  History of present illness   Patient is encephalopathic and/or intubated. Therefore history has been obtained from chart review.  73 year old male with past medical history as below, which is significant for atrial fibrillation on Xarelto, hypertension, coronary artery disease status post CABG, and alcohol abuse, however his diabetes for 10 years.  In the afternoon hours of 6/17 while shopping the patient became dizzy and diaphoretic.  EMS was called and he was transported immediately to the ED where he was found to bradycardic with pulse in the 30s to 40s.  While primary suspicion was cardiac in nature he was sent for CT scan of the head which showed a large cerebellar bleed.  Neurology was consulted immediately and upon their arrival he remained profoundly hypertensive.  Xarelto was reversed with [facility].  He was emergently taken to the operating room under Dr. Ronnald Ramp for evacuation of large cerebellar hemorrhage.  Postoperatively he was transferred to the ICU on the ventilator and PCCM was consulted for medical management.   Past Medical History   has a past medical history of Alcohol abuse, Hypertension, NSTEMI (non-ST elevated myocardial infarction) ([place]) (06/29/2018), PAF (paroxysmal atrial fibrillation) ([place]) (06/2018), Pulmonary embolus ([place]) (06/29/2018), and Tobacco abuse.   Significant Hospital Events   6/17 admit with cerebellar ICH. To OR for evacuation. Out to ICU intubated.   Consults:  Neurosurgery PCCM  Procedures:  ETT 6/17 >  Art line 6/17 >  Significant Diagnostic Tests:  CT head 6/17 > Acute intraparenchymal hemorrhage within the left  cerebellum with volume of 28 mL and intraventricular extension to the third and fourth ventricles. 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 CTA head 6/17 > Severely motion degraded study. No proximal large vessel occlusion of the anterior circulation. The hemorrhage pattern of the left cerebellum is most consistent with hypertensive hemorrhage.  Micro Data:  SARS-CoV-2 6/17 > neg  Antimicrobials:  Periop cefazolin 6/17  Interim history/subjective:    Objective   Blood pressure (!) 155/73, pulse (!) 58, temperature 98.1 F (36.7 C), temperature source Axillary, resp. rate (!) 21, height 5\' 4"  (1.626 m), weight 68.1 kg, SpO2 100 %.    Vent Mode: PRVC FiO2 (%):  [50 %-100 %] 100 % Set Rate:  [20 bmp] 20 bmp Vt Set:  [470 mL] 470 mL PEEP:  [5 cmH20] 5 cmH20 Plateau Pressure:  [16 cmH20-22 cmH20] 16 cmH20   Intake/Output Summary (Last 24 hours) at 12/31/2018 2325 Last data filed at 12/27/2018 2215 Gross per 24 hour  Intake 1239 ml  Output 1025 ml  Net 214 ml   Filed Weights   12/26/2018 1352 12/24/2018 2245  Weight: 68 kg 68.1 kg    Examination: General: Elderly male on vent HENT: Surgical area to occiput. CDI. PERRL.  Lungs: Clear Cardiovascular: RRR, no MRG Abdomen: Soft, non-distended Extremities: No acute deformity. Sedated, but moving all four extremities to pain.  Neuro: Sedated. Agitated at times.   Resolved Hospital Problem list     Assessment & Plan:   Cerebellar Intraparenchymal hemorrhage: s/p evacuation 6/17 - management per Stroke service and neurosurgery - Clevidipine to keep SBP < 100-140 mmHg  Inability to protect  airway in setting of IPH - Full vent support - ABG - Propofol and PRN fentanyl to keep RASS -2 to -3.  - CXR repeat in AM.  - VAP bundle  Atrial fibrillation on Xarelto: Currently sinus on tele. Coagulopathy reversed with [facility] in ED.  Hypertension -  Telemetry monitoring - Holding all anticoagulation including home aspirin.  - Continue home losartan and metoprolol  NAG acidosis: - repeat BMP, if persists could consider bicarb infusion.   History of ETOH abuse: This is apparently remote and he has not used for about 10 years: LFT normal on arrival  - Supportive care  Best practice:  Diet: NPO Pain/Anxiety/Delirium protocol (if indicated): Propofol infusion, Fentanyl PRN VAP protocol (if indicated): Per protocol DVT prophylaxis: per primary GI prophylaxis: PPI Glucose control: SSI Mobility: BR Code Status: FULL Family Communication:  Disposition: ICU  Labs   CBC: Recent Labs  Lab 12/19/2018 1338 12/25/2018 1345  WBC 14.8*  --   HGB 15.9 17.0  HCT 48.9 50.0  MCV 91.9  --   PLT 233  --     Basic Metabolic Panel: Recent Labs  Lab 12/24/2018 1339 12/17/2018 1345  NA 137 137  K 5.0 4.9  CL 106 105  CO2 19*  --   GLUCOSE 139* 139*  BUN 18 22  CREATININE 1.17 1.00  CALCIUM 9.2  --    GFR: Estimated Creatinine Clearance: 56.7 mL/min (by C-G formula based on SCr of 1 mg/dL). Recent Labs  Lab 12/31/2018 1338  WBC 14.8*    Liver Function Tests: Recent Labs  Lab 01/04/2019 1339  AST 34  ALT 30  ALKPHOS 41  BILITOT 1.2  PROT 6.8  ALBUMIN 4.1   Recent Labs  Lab 01/12/2019 1338  LIPASE 28   No results for input(s): AMMONIA in the last 168 hours.  ABG    Component Value Date/Time   PHART 7.343 (L) 07/02/2018 0447   PCO2ART 38.7 07/02/2018 0447   PO2ART 75.0 (L) 07/02/2018 0447   HCO3 20.9 07/02/2018 0447   TCO2 25 01/01/2019 1345   ACIDBASEDEF 4.0 (H) 07/02/2018 0447   O2SAT 57.0 07/04/2018 1000     Coagulation Profile: Recent Labs  Lab 01/12/2019 1338  INR 1.6*    Cardiac Enzymes: Recent Labs  Lab 12/28/2018 1338  TROPONINI 0.05*    HbA1C: Hgb A1c MFr Bld  Date/Time Value Ref Range Status  12/21/2018 06:03 PM 6.1 (H) 4.8 - 5.6 % Final    Comment:    (NOTE) Pre diabetes:          5.7%-6.4%  Diabetes:              >6.4% Glycemic control for   <7.0% adults with diabetes   06/29/2018 01:44 PM 5.4 4.8 - 5.6 % Final    Comment:    (NOTE) Pre diabetes:          5.7%-6.4% Diabetes:              >6.4% Glycemic control for   <7.0% adults with diabetes     CBG: Recent Labs  Lab 01/04/2019 1359  [place]*    Review of Systems:   Unable as patient is encephalopathic and intubated  Past Medical History  He,  has a past medical history of Alcohol abuse, Hypertension, NSTEMI (non-ST elevated myocardial infarction) ([place]) (06/29/2018), PAF (paroxysmal atrial fibrillation) ([place]) (06/2018), Pulmonary embolus ([place]) (06/29/2018), and Tobacco abuse.   Surgical History    Past Surgical History:  Procedure Laterality Date  .  CORONARY ARTERY BYPASS GRAFT N/A 07/01/2018   Procedure: CORONARY ARTERY BYPASS GRAFTING (CABG) x 4, LIMA TO LAD, SVG TO OM1, SVG TO DIAG, SVG TO PL, USING LEFT INTERNAL MAMMARY ARTERY AND RIGHT GREATER SAPHENOUS VEIN HARVESTED ENDOSCOPICALLY;  Surgeon: Grace Isaac, MD;  Location: [facility];  Service: Open Heart Surgery;  Laterality: N/A;  . IABP INSERTION Right 06/29/2018   Procedure: IABP Insertion;  Surgeon: Sherren Mocha, MD;  Location: [facility];  Service: Cardiovascular;  Laterality: Right;  . LEFT HEART CATH AND CORONARY ANGIOGRAPHY N/A 06/29/2018   Procedure: LEFT HEART CATH AND CORONARY ANGIOGRAPHY;  Surgeon: Sherren Mocha, MD;  Location: [facility];  Service: Cardiovascular;  Laterality: N/A;  . TEE WITHOUT CARDIOVERSION N/A 07/01/2018   Procedure: TRANSESOPHAGEAL ECHOCARDIOGRAM (TEE);  Surgeon: Grace Isaac, MD;  Location: [facility];  Service: Open Heart Surgery;  Laterality: N/A;     Social History   reports that he quit smoking about 6 months ago. His smoking use included cigarettes. He has a 25.00 pack-year smoking history. He has never used smokeless tobacco. He reports previous alcohol use. He reports that he does not use  drugs.   Family History   His family history includes CAD in his father. There is no history of Stroke or Sudden death.   Allergies No Known Allergies   Home Medications  Prior to Admission medications   Medication Sig Start Date End Date Taking? Authorizing Provider  aspirin EC 81 MG EC tablet Take 1 tablet (81 mg total) by mouth daily. 07/07/18  Yes Barrett, Erin R, PA-C  atorvastatin (LIPITOR) 80 MG tablet Take 1 tablet (80 mg total) by mouth daily at 6 PM. 08/06/18  Yes Barrett, Evelene Croon, PA-C  losartan (COZAAR) 100 MG tablet Take 1 tablet (100 mg total) by mouth daily. 08/06/18  Yes Barrett, Evelene Croon, PA-C  metoprolol succinate (TOPROL-XL) 50 MG 24 hr tablet Take 1 tablet (50 mg total) by mouth daily. Take with or immediately following a meal. 08/06/18  Yes Barrett, Evelene Croon, PA-C  rivaroxaban (XARELTO) 20 MG TABS tablet Take 20 mg by mouth daily with supper.   Yes [provider]     Critical care time: 35 mins     Georgann Housekeeper, AGACNP-BC [place] Pager [phone] or [phone]  01/01/2019 11:57 PM

## 2018-12-30 NOTE — ED Provider Notes (Signed)
[facility] EMERGENCY DEPARTMENT Provider Note   CSN: [id number] Arrival date & time: 12/27/2018  1330    History   Chief Complaint Chief Complaint  Patient presents with  . complete heart block    HPI Randy [name] is a 73 y.o. male hx of CABG, afib on xarelto, here presenting with dizziness, diaphoresis.  Patient states that he was shopping at [facility] and had sudden onset of dizziness.  He describes it as room spinning and nausea and felt like he was going to pass out.  He immediately sat down and EMS was called.  Denies any pain or shortness of breath.  EMS was called and he was noted to be bradycardic in the 30s to 40s.       The history is provided by the patient.    Past Medical History:  Diagnosis Date  . Alcohol abuse    Last drink was 10 years ago (2009).  Marland Kitchen Hypertension   . NSTEMI (non-ST elevated myocardial infarction) ([place]) 06/29/2018   s/p CABG w/ LIMA-LAD, SVG-diagonal, SVG-OM, SVG-PLV  . PAF (paroxysmal atrial fibrillation) ([place]) 06/2018   Postop CABG  . Pulmonary embolus ([place]) 06/29/2018  . Tobacco abuse     Patient Active Problem List   Diagnosis Date Noted  . CAD (coronary artery disease) 10/16/2018  . Hyperlipidemia LDL goal <70 07/20/2018  . Postoperative atrial fibrillation ([place]) 07/20/2018  . Essential hypertension 07/20/2018  . Cellulitis of leg, right 07/14/2018  . Cellulitis of right leg 07/14/2018  . S/P CABG x 4 07/01/2018    Past Surgical History:  Procedure Laterality Date  . CORONARY ARTERY BYPASS GRAFT N/A 07/01/2018   Procedure: CORONARY ARTERY BYPASS GRAFTING (CABG) x 4, LIMA TO LAD, SVG TO OM1, SVG TO DIAG, SVG TO PL, USING LEFT INTERNAL MAMMARY ARTERY AND RIGHT GREATER SAPHENOUS VEIN HARVESTED ENDOSCOPICALLY;  Surgeon: Grace Isaac, MD;  Location: [facility];  Service: Open Heart Surgery;  Laterality: N/A;  . IABP INSERTION Right 06/29/2018   Procedure: IABP Insertion;  Surgeon: Sherren Mocha, MD;  Location: [facility];  Service: Cardiovascular;  Laterality: Right;  . LEFT HEART CATH AND CORONARY ANGIOGRAPHY N/A 06/29/2018   Procedure: LEFT HEART CATH AND CORONARY ANGIOGRAPHY;  Surgeon: Sherren Mocha, MD;  Location: [facility];  Service: Cardiovascular;  Laterality: N/A;  . TEE WITHOUT CARDIOVERSION N/A 07/01/2018   Procedure: TRANSESOPHAGEAL ECHOCARDIOGRAM (TEE);  Surgeon: Grace Isaac, MD;  Location: [facility];  Service: Open Heart Surgery;  Laterality: N/A;        Home Medications    Prior to Admission medications   Medication Sig Start Date End Date Taking? Authorizing Provider  aspirin EC 81 MG EC tablet Take 1 tablet (81 mg total) by mouth daily. 07/07/18   Barrett, Erin R, PA-C  atorvastatin (LIPITOR) 80 MG tablet Take 1 tablet (80 mg total) by mouth daily at 6 PM. 08/06/18   Barrett, Evelene Croon, PA-C  losartan (COZAAR) 100 MG tablet Take 1 tablet (100 mg total) by mouth daily. 08/06/18   Barrett, Evelene Croon, PA-C  metoprolol succinate (TOPROL-XL) 50 MG 24 hr tablet Take 1 tablet (50 mg total) by mouth daily. Take with or immediately following a meal. 08/06/18   Barrett, Evelene Croon, PA-C    Family History Family History  Problem Relation Age of Onset  . CAD Father        died from a "massive heart attack" in his mid 72's  . Stroke Neg Hx   .  Sudden death Neg Hx     Social History Social History   Tobacco Use  . Smoking status: Former Smoker    Packs/day: 0.50    Years: 50.00    Pack years: 25.00    Types: Cigarettes    Quit date: 06/29/2018    Years since quitting: 0.5  . Smokeless tobacco: Never Used  Substance Use Topics  . Alcohol use: Not Currently  . Drug use: Never     Allergies   Patient has no known allergies.   Review of Systems Review of Systems  Constitutional: Positive for diaphoresis.  Neurological: Positive for dizziness.  All other systems reviewed and are negative.    Physical Exam Updated Vital Signs BP (!) 202/95   Pulse (!)  49   Resp 15   Ht 5\' 4"  (1.626 m)   Wt 68 kg   SpO2 94%   BMI 25.75 kg/m   Physical Exam Vitals signs and nursing note reviewed.  Constitutional:      Comments: Pale, ill appearing, vomiting   HENT:     Head: Normocephalic.     Mouth/Throat:     Mouth: Mucous membranes are moist.  Eyes:     Comments: Nystagmus to the right, no rotatory or vertical nystagmus. Doesn't change with direction   Neck:     Musculoskeletal: Normal range of motion.  Cardiovascular:     Rate and Rhythm: Normal rate.     Pulses: Normal pulses.  Pulmonary:     Effort: Pulmonary effort is normal.  Abdominal:     General: Abdomen is flat.  Musculoskeletal: Normal range of motion.  Skin:    General: Skin is warm.     Capillary Refill: Capillary refill takes less than 2 seconds.  Neurological:     General: No focal deficit present.     Mental Status: He is oriented to person, place, and time.  Psychiatric:        Mood and Affect: Mood normal.      ED Treatments / Results  Labs (all labs ordered are listed, but only abnormal results are displayed) Labs Reviewed  CBC - Abnormal; Notable for the following components:      Result Value   WBC 14.8 (*)    All other components within normal limits  TROPONIN I - Abnormal; Notable for the following components:   Troponin I 0.05 (*)    All other components within normal limits  PROTIME-INR - Abnormal; Notable for the following components:   Prothrombin Time 18.7 (*)    INR 1.6 (*)    All other components within normal limits  I-STAT CHEM 8, ED - Abnormal; Notable for the following components:   Glucose, Bld 139 (*)    All other components within normal limits  CBG MONITORING, ED - Abnormal; Notable for the following components:   Glucose-Capillary 114 (*)    All other components within normal limits  SARS CORONAVIRUS 2 (HOSPITAL ORDER, [facility])  LIPASE, BLOOD  COMPREHENSIVE METABOLIC PANEL  I-STAT TROPONIN, ED     EKG EKG Interpretation  Date/Time:  Wednesday December 30 2018 13:42:23 EDT Ventricular Rate:  55 PR Interval:    QRS Duration: 109 QT Interval:  457 QTC Calculation: 438 R Axis:   -40 Text Interpretation:  Sinus rhythm Probable left atrial enlargement Probable inferior infarct, recent Anterolateral infarct, age indeterminate No significant change since last tracing Confirmed by Wandra Arthurs [phone]) on 01/11/2019 1:49:25 PM Also confirmed by  Wandra Arthurs [phone]), editor Philomena Doheny [phone])  on 12/17/2018 2:01:09 PM   Radiology No results found.  Procedures Procedures (including critical care time)  Medications Ordered in ED Medications  sodium chloride flush (NS) 0.9 % injection 3 mL (3 mLs Intravenous Not Given 01/06/2019 1402)  hydrALAZINE (APRESOLINE) injection 5 mg (has no administration in time range)  metoCLOPramide (REGLAN) injection 10 mg (has no administration in time range)  diphenhydrAMINE (BENADRYL) injection 25 mg (has no administration in time range)  ondansetron (ZOFRAN) injection 4 mg (4 mg Intravenous Given 12/24/2018 1357)  meclizine (ANTIVERT) tablet 25 mg (25 mg Oral Given 01/09/2019 1357)     Initial Impression / Assessment and Plan / ED Course  I have reviewed the triage vital signs and the nursing notes.  Pertinent labs & imaging results that were available during my care of the patient were reviewed by me and considered in my medical decision making (see chart for details).       Randy [name] is a 73 y.o. male here with dizziness, diaphoresis. Has CABG so consider ACS. Consider vertebral artery dissection given the nystagmus. Will get CTA head/neck. Will get labs, trop. Will give antiemetics and meclizine.   3:19 PM  Trop 0.05. CTA head/neck pending. HR in the 40-50s. If CTA head/neck negative, will need MRI. Anticipate admission for symptomatic bradycardia, hypertensive urgency, r/o ACS. Signed out to Dr. Tyrone Nine in the ED.    Final Clinical Impressions(s) / ED  Diagnoses   Final diagnoses:  None    ED Discharge Orders    None       Drenda Freeze, MD 12/21/2018 1520

## 2018-12-30 NOTE — OR Nursing (Signed)
Patient arrived to OR with foley indwelling catheter, is not charted in [facility]

## 2018-12-30 NOTE — Progress Notes (Signed)
Pt transported via vent to 2H21 from [place]. No complications noted. Report given to Gennaro Africa RRT RCP.

## 2018-12-31 ENCOUNTER — Inpatient Hospital Stay (HOSPITAL_COMMUNITY): Payer: Medicare Other

## 2018-12-31 ENCOUNTER — Inpatient Hospital Stay: Payer: Self-pay

## 2018-12-31 ENCOUNTER — Encounter (HOSPITAL_COMMUNITY): Payer: Self-pay | Admitting: Neurological Surgery

## 2018-12-31 DIAGNOSIS — Z978 Presence of other specified devices: Secondary | ICD-10-CM

## 2018-12-31 DIAGNOSIS — I482 Chronic atrial fibrillation, unspecified: Secondary | ICD-10-CM

## 2018-12-31 DIAGNOSIS — D72829 Elevated white blood cell count, unspecified: Secondary | ICD-10-CM

## 2018-12-31 DIAGNOSIS — I160 Hypertensive urgency: Secondary | ICD-10-CM

## 2018-12-31 DIAGNOSIS — I614 Nontraumatic intracerebral hemorrhage in cerebellum: Principal | ICD-10-CM

## 2018-12-31 DIAGNOSIS — E872 Acidosis, unspecified: Secondary | ICD-10-CM

## 2018-12-31 DIAGNOSIS — Z7901 Long term (current) use of anticoagulants: Secondary | ICD-10-CM

## 2018-12-31 DIAGNOSIS — R1312 Dysphagia, oropharyngeal phase: Secondary | ICD-10-CM

## 2018-12-31 DIAGNOSIS — I639 Cerebral infarction, unspecified: Secondary | ICD-10-CM

## 2018-12-31 DIAGNOSIS — G911 Obstructive hydrocephalus: Secondary | ICD-10-CM

## 2018-12-31 DIAGNOSIS — I25709 Atherosclerosis of coronary artery bypass graft(s), unspecified, with unspecified angina pectoris: Secondary | ICD-10-CM

## 2018-12-31 DIAGNOSIS — G936 Cerebral edema: Secondary | ICD-10-CM

## 2018-12-31 LAB — BASIC METABOLIC PANEL
Anion gap: 10 (ref 5–15)
BUN: 15 mg/dL (ref 8–23)
CO2: 20 mmol/L — ABNORMAL LOW (ref 22–32)
Calcium: 8.6 mg/dL — ABNORMAL LOW (ref 8.9–10.3)
Chloride: 106 mmol/L (ref 98–111)
Creatinine, Ser: 1.16 mg/dL (ref 0.61–1.24)
GFR calc Af Amer: >60 mL/min (ref >60)
GFR calc non Af Amer: >60 mL/min (ref >60)
Glucose, Bld: 130 mg/dL — ABNORMAL HIGH (ref 70–99)
Potassium: 3.8 mmol/L (ref 3.5–5.1)
Sodium: 136 mmol/L (ref 135–145)

## 2018-12-31 LAB — GLUCOSE, CAPILLARY
Glucose-Capillary: 129 mg/dL — ABNORMAL HIGH (ref 70–99)
Glucose-Capillary: 144 mg/dL — ABNORMAL HIGH (ref 70–99)
Glucose-Capillary: 147 mg/dL — ABNORMAL HIGH (ref 70–99)
Glucose-Capillary: 167 mg/dL — ABNORMAL HIGH (ref 70–99)
Glucose-Capillary: 140 mg/dL — ABNORMAL HIGH (ref 70–99)
Glucose-Capillary: 152 mg/dL — ABNORMAL HIGH (ref 70–99)

## 2018-12-31 LAB — PREPARE FRESH FROZEN PLASMA
Unit division: 0
Unit division: 0

## 2018-12-31 LAB — CBC
HCT: 38.2 % — ABNORMAL LOW (ref 39.0–52.0)
Hemoglobin: 12.9 g/dL — ABNORMAL LOW (ref 13.0–17.0)
MCH: 30.4 pg (ref 26.0–34.0)
MCHC: 33.8 g/dL (ref 30.0–36.0)
MCV: 89.9 fL (ref 80.0–100.0)
Platelets: 191 10*3/uL (ref 150–400)
RBC: 4.25 MIL/uL (ref 4.22–5.81)
RDW: 14.9 % (ref 11.5–15.5)
WBC: 12.5 10*3/uL — ABNORMAL HIGH (ref 4.0–10.5)
nRBC: 0 % (ref 0.0–0.2)

## 2018-12-31 LAB — SODIUM
Sodium: 138 mmol/L (ref 135–145)
Sodium: 140 mmol/L (ref 135–145)
Sodium: 140 mmol/L (ref 135–145)

## 2018-12-31 LAB — BPAM FFP
Blood Product Expiration Date: 202006182359
Blood Product Expiration Date: 202006182359
ISSUE DATE / TIME: 202006171914
ISSUE DATE / TIME: 202006171914
Unit Type and Rh: 6200
Unit Type and Rh: 6200

## 2018-12-31 LAB — ECHOCARDIOGRAM COMPLETE
Height: 64 in
Weight: 2402.13 oz

## 2018-12-31 LAB — MRSA PCR SCREENING: MRSA by PCR: NEGATIVE

## 2018-12-31 LAB — PROTIME-INR
INR: 1.2 (ref 0.8–1.2)
Prothrombin Time: 15.3 s — ABNORMAL HIGH (ref 11.4–15.2)

## 2018-12-31 MED ORDER — ACETAMINOPHEN 650 MG RE SUPP: 650.0000 mg | RECTAL | Status: DC | PRN

## 2018-12-31 MED ORDER — SODIUM CHLORIDE 3 % IV SOLN
INTRAVENOUS | Status: DC
  Administered 2018-12-31 – 2019-01-02 (×5): 50 mL/h via INTRAVENOUS
  Filled 2018-12-31 (×8): qty 500

## 2018-12-31 MED ORDER — SODIUM CHLORIDE 0.9% FLUSH: 10.0000 mL | INTRAVENOUS | Status: DC | PRN

## 2018-12-31 MED ORDER — ORAL CARE MOUTH RINSE
15.0000 mL | Freq: Two times a day (BID) | OROMUCOSAL | Status: DC
  Administered 2018-12-31 – 2019-01-01 (×4): 15 mL via OROMUCOSAL

## 2018-12-31 MED ORDER — INSULIN ASPART 100 UNIT/ML ~~LOC~~ SOLN
0.0000 [IU] | SUBCUTANEOUS | Status: DC
  Administered 2018-12-31: 2 [IU] via SUBCUTANEOUS
  Administered 2018-12-31 (×3): 1 [IU] via SUBCUTANEOUS
  Administered 2019-01-01: 2 [IU] via SUBCUTANEOUS
  Administered 2019-01-01 (×3): 1 [IU] via SUBCUTANEOUS
  Administered 2019-01-01: 2 [IU] via SUBCUTANEOUS
  Administered 2019-01-01: 1 [IU] via SUBCUTANEOUS
  Administered 2019-01-02 (×2): 2 [IU] via SUBCUTANEOUS
  Administered 2019-01-02 (×3): 1 [IU] via SUBCUTANEOUS
  Administered 2019-01-03 (×2): 2 [IU] via SUBCUTANEOUS
  Administered 2019-01-03 (×3): 1 [IU] via SUBCUTANEOUS
  Administered 2019-01-04 – 2019-01-05 (×8): 2 [IU] via SUBCUTANEOUS
  Administered 2019-01-05: 3 [IU] via SUBCUTANEOUS
  Administered 2019-01-05 (×3): 2 [IU] via SUBCUTANEOUS
  Administered 2019-01-06: 3 [IU] via SUBCUTANEOUS
  Administered 2019-01-06 – 2019-01-07 (×8): 2 [IU] via SUBCUTANEOUS
  Administered 2019-01-07: 04:00:00 3 [IU] via SUBCUTANEOUS
  Administered 2019-01-07: 2 [IU] via SUBCUTANEOUS
  Administered 2019-01-07 – 2019-01-10 (×7): 1 [IU] via SUBCUTANEOUS

## 2018-12-31 MED ORDER — ACETAMINOPHEN 325 MG PO TABS: 650.0000 mg | ORAL_TABLET | ORAL | Status: DC | PRN

## 2018-12-31 MED ORDER — METOPROLOL TARTRATE 25 MG PO TABS: 25.0000 mg | ORAL_TABLET | Freq: Two times a day (BID) | ORAL | Status: DC

## 2018-12-31 MED ORDER — FENTANYL CITRATE (PF) 100 MCG/2ML IJ SOLN
25.0000 ug | INTRAMUSCULAR | Status: DC | PRN
  Administered 2018-12-31: 50 ug via INTRAVENOUS
  Filled 2018-12-31: qty 2

## 2018-12-31 MED ORDER — ACETAMINOPHEN 160 MG/5ML PO SOLN
ORAL | Status: AC
  Filled 2018-12-31: qty 20.3

## 2018-12-31 MED ORDER — ACETAMINOPHEN 160 MG/5ML PO SOLN
650.0000 mg | ORAL | Status: DC | PRN
  Administered 2018-12-31 – 2019-01-10 (×18): 650 mg
  Filled 2018-12-31 (×18): qty 20.3

## 2018-12-31 MED ORDER — CHLORHEXIDINE GLUCONATE CLOTH 2 % EX PADS
6.0000 | MEDICATED_PAD | Freq: Every day | CUTANEOUS | Status: DC
  Administered 2018-12-31 – 2019-01-05 (×4): 6 via TOPICAL

## 2018-12-31 MED ORDER — DEXMEDETOMIDINE HCL IN NACL 200 MCG/50ML IV SOLN
0.4000 ug/kg/h | INTRAVENOUS | Status: DC
  Administered 2018-12-31 (×2): 0.4 ug/kg/h via INTRAVENOUS
  Administered 2018-12-31: 0.999 ug/kg/h via INTRAVENOUS
  Administered 2019-01-01: 0.4 ug/kg/h via INTRAVENOUS
  Filled 2018-12-31 (×4): qty 50

## 2018-12-31 MED ORDER — LOSARTAN POTASSIUM 50 MG PO TABS
100.0000 mg | ORAL_TABLET | Freq: Every day | ORAL | Status: DC
  Administered 2018-12-31: 100 mg
  Filled 2018-12-31: qty 2

## 2018-12-31 MED ORDER — SUCCINYLCHOLINE CHLORIDE 20 MG/ML IJ SOLN
INTRAMUSCULAR | Status: AC | PRN
  Administered 2018-12-30: 150 mg via INTRAVENOUS

## 2018-12-31 MED ORDER — ETOMIDATE 2 MG/ML IV SOLN
INTRAVENOUS | Status: AC | PRN
  Administered 2018-12-30: 20 mg via INTRAVENOUS

## 2018-12-31 MED ORDER — ORAL CARE MOUTH RINSE
15.0000 mL | Freq: Two times a day (BID) | OROMUCOSAL | Status: DC
  Administered 2018-12-31: 15 mL via OROMUCOSAL

## 2018-12-31 MED ORDER — CHLORHEXIDINE GLUCONATE 0.12 % MT SOLN: 15.0000 mL | Freq: Two times a day (BID) | OROMUCOSAL | Status: DC

## 2018-12-31 MED ORDER — SODIUM CHLORIDE 0.9 % IV SOLN
INTRAVENOUS | Status: DC | PRN
  Administered 2018-12-31: 250 mL via INTRAVENOUS
  Administered 2019-01-03: 500 mL via INTRAVENOUS
  Administered 2019-01-03: 1000 mL via INTRAVENOUS
  Administered 2019-01-03: 250 mL via INTRAVENOUS

## 2018-12-31 MED ORDER — FENTANYL CITRATE (PF) 100 MCG/2ML IJ SOLN: 25.0000 ug | INTRAMUSCULAR | Status: DC | PRN

## 2018-12-31 MED ORDER — METOPROLOL TARTRATE 25 MG PO TABS
25.0000 mg | ORAL_TABLET | Freq: Two times a day (BID) | ORAL | Status: DC
  Administered 2018-12-31 – 2019-01-01 (×2): 25 mg
  Filled 2018-12-31 (×2): qty 1

## 2018-12-31 MED ORDER — SODIUM CHLORIDE 0.9% FLUSH
10.0000 mL | Freq: Two times a day (BID) | INTRAVENOUS | Status: DC
  Administered 2018-12-31 – 2019-01-10 (×15): 10 mL

## 2018-12-31 NOTE — Progress Notes (Signed)
Peripherally Inserted Central Catheter/Midline Placement  The IV Nurse has discussed with the patient and/or persons authorized to consent for the patient, the purpose of this procedure and the potential benefits and risks involved with this procedure.  The benefits include less needle sticks, lab draws from the catheter, and the patient may be discharged home with the catheter. Risks include, but not limited to, infection, bleeding, blood clot (thrombus formation), and puncture of an artery; nerve damage and irregular heartbeat and possibility to perform a PICC exchange if needed/ordered by physician.  Alternatives to this procedure were also discussed.  Bard Power PICC patient education guide, fact sheet on infection prevention and patient information card has been provided to patient /or left at bedside.  Consent obtained via telephone from brother due to altered mental status.  PICC/Midline Placement Documentation  PICC Triple Lumen 12/31/18 PICC Right Brachial 38 cm 0 cm (Active)  Indication for Insertion or Continuance of Line Administration of hyperosmolar/irritating solutions (i.e. TPN, Vancomycin, etc.) 12/31/18 1814  Exposed Catheter (cm) 0 cm 12/31/18 1814  Site Assessment Clean;Dry;Intact 12/31/18 1814  Lumen #1 Status Flushed;Saline locked;Blood return noted 12/31/18 1814  Lumen #2 Status Flushed;Saline locked;Blood return noted 12/31/18 1814  Lumen #3 Status Flushed;Saline locked;Blood return noted 12/31/18 1814  Dressing Type Transparent 12/31/18 1814  Dressing Status Clean;Dry;Intact 12/31/18 1814  Dressing Intervention New dressing 12/31/18 0321  Dressing Change Due 01/07/19 12/31/18 1814       Kenry Daubert, Nicolette Bang 12/31/2018, 6:15 PM

## 2018-12-31 NOTE — Anesthesia Postprocedure Evaluation (Signed)
Anesthesia Post Note  Patient: Randy [name]  Procedure(s) Performed: SUBOCCIPITAL CRANIECTOMY FOR CEREBELLAR HEMATOMA (N/A Head)     Patient location during evaluation: SICU Anesthesia Type: General Level of consciousness: sedated Pain management: pain level controlled Vital Signs Assessment: post-procedure vital signs reviewed and stable Respiratory status: patient remains intubated per anesthesia plan Cardiovascular status: stable Postop Assessment: no apparent nausea or vomiting Anesthetic complications: no    Last Vitals:  Vitals:   12/31/18 0431 12/31/18 0445  BP:  119/60  Pulse: 70 62  Resp: 20 20  Temp: 37.8 C 37.8 C  SpO2: 100% 100%    Last Pain:  Vitals:   12/31/18 0400  TempSrc: Bladder  PainSc:                  Tiajuana Amass

## 2018-12-31 NOTE — Progress Notes (Signed)
  Echocardiogram 2D Echocardiogram has been performed.  Marybelle Killings 12/31/2018, 12:04 PM

## 2018-12-31 NOTE — Progress Notes (Signed)
Patient's Iphone with black case, mini cooper key with one key attached, and wallet containing two $20 bills and four $1 bills, two [employer] bank cards, social security card, [employer] card, and three insurance cards taken down to security to be locked up in locker. Yellow pick up slip for discharge placed in patient's chart.

## 2018-12-31 NOTE — Progress Notes (Addendum)
STROKE TEAM PROGRESS NOTE   INTERVAL HISTORY Pt sleepy but able to arouse. Still intubated but off sedation. Still on cleviprex, able to follow commands but very lethargic. CT showed s/p left suboccipital evacuation and mild hydrocephalus. Will start 3% and repeat CT in am.   Vitals:   12/31/18 1015 12/31/18 1030 12/31/18 1045 12/31/18 1100  BP: (!) 144/74 121/60 (!) 145/85 (!) 131/53  Pulse: 70 68 81 70  Resp: 11 14 18 20   Temp: 99.1 F (37.3 C) 99.1 F (37.3 C) 99 F (37.2 C) 99 F (37.2 C)  TempSrc:      SpO2: 95%     Weight:      Height:        CBC:  Recent Labs  Lab 01/12/2019 1338 01/02/2019 1345 12/31/18 0540  WBC 14.8*  --  12.5*  HGB 15.9 17.0 12.9*  HCT 48.9 50.0 38.2*  MCV 91.9  --  89.9  PLT 233  --  431    Basic Metabolic Panel:  Recent Labs  Lab 01/04/2019 1339 01/06/2019 1345 12/31/18 0540  NA 137 137 136  K 5.0 4.9 3.8  CL 106 105 106  CO2 19*  --  20*  GLUCOSE 139* 139* 130*  BUN 18 22 15   CREATININE 1.17 1.00 1.16  CALCIUM 9.2  --  8.6*   Lipid Panel:     Component Value Date/Time   CHOL 126 12/26/2018 1803   CHOL 123 08/18/2018 0947   TRIG 100 12/20/2018 1803   HDL 58 12/25/2018 1803   HDL 68 08/18/2018 0947   CHOLHDL 2.2 01/02/2019 1803   VLDL 20 12/22/2018 1803   LDLCALC 48 12/16/2018 1803   LDLCALC 43 08/18/2018 0947   HgbA1c:  Lab Results  Component Value Date   HGBA1C 6.1 (H) 01/09/2019   Urine Drug Screen:     Component Value Date/Time   LABOPIA NONE DETECTED 12/18/2018 2301   COCAINSCRNUR NONE DETECTED 12/28/2018 2301   LABBENZ NONE DETECTED 12/22/2018 2301   AMPHETMU NONE DETECTED 01/07/2019 2301   THCU NONE DETECTED 01/07/2019 2301   LABBARB NONE DETECTED 01/01/2019 2301    Alcohol Level     Component Value Date/Time   ETH <10 06/29/2018 1035    IMAGING Ct Angio Head W Or Wo Contrast  Result Date: 12/14/2018 CLINICAL DATA:  Intracranial hemorrhage EXAM: CT ANGIOGRAPHY HEAD TECHNIQUE: Multidetector CT imaging of  the head was performed using the standard protocol during bolus administration of intravenous contrast. Multiplanar CT image reconstructions and MIPs were obtained to evaluate the vascular anatomy. CONTRAST:  173mL OMNIPAQUE IOHEXOL 350 MG/ML SOLN COMPARISON:  Head CT 12/17/2018 FINDINGS: The examination is severely degraded by motion. CTA HEAD FINDINGS POSTERIOR CIRCULATION: Poor visualization of the posterior fossa due to motion. Distal basilar artery and the posterior cerebral arteries are patent. ANTERIOR CIRCULATION: --Intracranial internal carotid arteries: There is atherosclerotic calcification of both internal carotid arteries. Both are patent, but assessment is otherwise limited by motion. --Anterior cerebral arteries (ACA): No proximal occlusion --Middle cerebral arteries (MCA): No proximal occlusion IMPRESSION: 1. Severely motion degraded study. No proximal large vessel occlusion of the anterior circulation. 2. The hemorrhage pattern of the left cerebellum is most consistent with hypertensive hemorrhage. Electronically Signed   By: Ulyses Jarred M.D.   On: 12/16/2018 17:10   Ct Head Wo Contrast  Result Date: 12/31/2018 CLINICAL DATA:  Intracranial hemorrhage. EXAM: CT HEAD WITHOUT CONTRAST TECHNIQUE: Contiguous axial images were obtained from the base of the skull through the  vertex without intravenous contrast. COMPARISON:  CT head without contrast 01/06/2019 FINDINGS: Brain: The patient is status post suboccipital craniotomy. The left cerebellar hemorrhage was evacuated. Fourth ventricle is decompressed. There is some residual blood products in the surgical cavity. Intraventricular hemorrhage is again noted within the fourth ventricle and aqueduct of Sylvius to the third ventricle. There is increased blood layering in the lateral ventricles bilaterally. There is no hydrocephalus. Minimal pneumocephalus is now present. Remote periventricular white matter changes are stable. No acute supratentorial  cortical abnormality is present. Vascular: Atherosclerotic calcifications are present within the cavernous internal carotid arteries bilaterally. Left vertebral artery calcifications are present. There is no hyperdense vessel. Skull: Suboccipital craniotomy is noted. Calvarium is otherwise intact. No focal lytic or blastic lesions are present. Sinuses/Orbits: The paranasal sinuses and mastoid air cells are clear. The globes and orbits are within normal limits. IMPRESSION: 1. Left suboccipital craniotomy for evacuation of left cerebellar hemorrhage. 2. Decreased mass effect on the fourth ventricle and in the posterior fossa. 3. Intraventricular hemorrhage now evident in the fourth ventricle, aqueduct of Sylvius, third ventricle, and layering in the lateral ventricles bilaterally. 4. Minimal pneumocephalus as expected. 5. Atherosclerosis Electronically Signed   By: San Morelle M.D.   On: 12/31/2018 07:31   Ct Head Wo Contrast  Result Date: 01/04/2019 CLINICAL DATA:  ALTERED MENTAL STATUS AND HYPERTENSION EXAM: CT HEAD WITHOUT CONTRAST TECHNIQUE: Contiguous axial images were obtained from the base of the skull through the vertex without intravenous contrast. COMPARISON:  None. FINDINGS: Brain: There is a large intraparenchymal hematoma in the left cerebellum in the region of the dentate nucleus, measuring 3.7 x 4.4 x 3.3 cm (volume = 28 cm^3). There is intraventricular extension into the fourth ventricle and third ventricle. There is marked mass effect within the posterior fossa. No hydrocephalus. There is an old right basal ganglia small vessel infarct. Vascular: Atherosclerotic calcification of the vertebral and internal carotid arteries at the skull base. No abnormal hyperdensity of the major intracranial arteries or dural venous sinuses. Skull: The visualized skull base, calvarium and extracranial soft tissues are normal. Sinuses/Orbits: No fluid levels or advanced mucosal thickening of the visualized  paranasal sinuses. No mastoid or middle ear effusion. The orbits are normal. IMPRESSION: Acute intraparenchymal hemorrhage within the left cerebellum with volume of 28 mL and intraventricular extension to the third and fourth ventricles. 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 Critical Value/emergent results were called by telephone at the time of interpretation on 01/12/2019 at 4:14 pm to Dr. Shirlyn Goltz , who verbally acknowledged these results. Electronically Signed   By: Ulyses Jarred M.D.   On: 12/15/2018 16:15   Dg Chest Portable 1 View  Result Date: 12/28/2018 CLINICAL DATA:  Chest pain EXAM: PORTABLE CHEST 1 VIEW COMPARISON:  07/30/2018 FINDINGS: Heart size is slightly enlarged. The patient is status post prior median sternotomy. The endotracheal tube terminates approximately 5 cm above the carina. The enteric tube extends below the left hemidiaphragm. The side hole projects over the GE junction. There is no definite pneumothorax, however the left lung field is partially obscured by overlapping defibrillator pad. There is no acute osseous abnormality. There is a minimal amount of atelectasis at the lung bases. No definite pneumothorax. No large pleural effusion. Aortic calcifications are noted. IMPRESSION: 1. Lines and tubes as above. 2. Borderline cardiomegaly. The patient is status post prior median sternotomy. 3. The lungs are clear without evidence of a significant pneumothorax  or pleural effusion. Electronically Signed   By: Constance Holster M.D.   On: 12/19/2018 18:22   Dg Abd Portable 1v  Result Date: 12/27/2018 CLINICAL DATA:  OG tube placement EXAM: PORTABLE ABDOMEN - 1 VIEW COMPARISON:  None. FINDINGS: Esophageal tube tip overlies the proximal stomach, side-port in the region of GE junction. Pleural and parenchymal disease at the left base. Mild gas distention of colon with moderate stool.  IMPRESSION: Esophageal tube side-port overlies the GE junction, consider further advancement for more optimal positioning Electronically Signed   By: Donavan Foil M.D.   On: 12/28/2018 21:33   [place] Ekg Site Rite  Result Date: 12/31/2018 If Site Rite image not attached, placement could not be confirmed due to current cardiac rhythm.   PHYSICAL EXAM  Temp:  [97 F (36.1 C)-100.2 F (37.9 C)] 99 F (37.2 C) (06/18 1100) Pulse Rate:  [49-88] 70 (06/18 1100) Resp:  [9-22] 20 (06/18 1100) BP: (104-288)/(52-96) 131/53 (06/18 1100) SpO2:  [94 %-100 %] 95 % (06/18 1015) Arterial Line BP: (108-182)/(49-83) 137/60 (06/18 1100) FiO2 (%):  [40 %-100 %] 40 % (06/18 0754) Weight:  [68 kg-68.1 kg] 68.1 kg (06/17 2245)  General - Well nourished, well developed, intubated off sedation, lethargic and sleepy  Ophthalmologic - fundi not visualized due to noncooperation.  Cardiovascular - Regular rate and rhythm.  Neuro - intubated off sedation, eyes open on voice, able to following simple commands. Eyes in right gaze preference position, left gaze palsy, no nystagmus, inconsistently blinking to visual threat, doll's eyes present but sluggish, PERRL. Corneal reflex present, gag and cough present. Breathing over the vent.  Facial symmetry not able to test due to ET tube.  Tongue midline in mouth. Moving all extremities on commands, but BUEs against gravity but seem right weaker than left, BLEs 2/5 on commands, DF right seems weaker than left. DTR 1+ and no babinski. Sensation symmetrical as per pt, coordination not able to test due to lethargy and gait not tested.   ASSESSMENT/PLAN Randy [name] is a 73 y.o. male with history of tobacco abuse, and pulmonary embolus, paroxysmal atrial fibrillation, hypertension, alcohol abuse with history of coagulopathy in setting of Xarelto from prior alcoholism presenting with dizziness, diaphoresis and bradycardia.  Initially felt to be cardiac but cerebellar bleed found  on imaging.   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 Ronnald Ramp, post suboccipital Craniectomy 6/17  CT head 6/17 left cerebellar hemorrhage 28 mils with IVH.  Posterior fossa without hydrocephalus  CTA head motion degraded.  No gross AVM or aneurysm  CT head 6/18 L suboccipital craniotomy, evacuation L cerebellar hemorrhage.  Decreased mass-effect on fourth ventricle and posterior fossa.  IVH bilaterally.  Minimal pneumocephalus.  Atherosclerosis.  CT repeat in am  Will consider MRI to rule out hemorrhagic infarct once more stabilized   2D Echo EF 50 to 55%  LDL 48  HgbA1c 6.1  SCDs for VTE prophylaxis  aspirin 81 mg daily and Xarelto (rivaroxaban) daily prior to admission, now on No antithrombotic given hemorrhage.   Therapy recommendations:  pending   Disposition:  pending   Continue ICU level care  Cerebral Edema Induced Hypernatremia  Started on 3% protocol  Also on dexamethasone per neurosurgery  Na 136->138  Goal Na 150-155  Check Na q 6h  Keep foley  CT repeat in am  Hydrocephalus  Repeat CT 12/31/2018 showed mild developing hydrocephalus  At this  time no indication for EVD  CT repeat in a.m.  Acute Hypoxic Respiratory failure   Secondary to stroke   Intubated in ED   Extubated today  Far tolerating well  Post CABG Atrial Fibrillation  CABG 06/2018  Home anticoagulation:  Xarelto (rivaroxaban) daily   Reversed with [facility]  Hypertensive emergency  Home meds: Cozaar 100, Toprol 50  Systolic blood pressure greater than 200s with hemorrhage   Treated with Cleviprex   Currently stable  SBP goal <140   Restlessness  Developed restlessness after extubation  Put on Precedex  Close monitoring  Hyperlipidemia  Home meds: Lipitor 80  LDL 48, at goal < 70  Hold statin given hemorrhage  Consider continuation of statin lower  dose at discharge  Dysphagia   Secondary to stroke   Currently n.p.o.   Speech following  If not passing swallow, will consider cortrak tomorrow  Other Stroke Risk Factors  Advanced age  Former smoker, quit smoking 6 months ago  History of alcohol abuse, last drink 10 years ago  Coronary artery disease, NSTEMI s/p CABG, December 2019  Other Active Problems  Leukocytosis 14.8->12.5. TMax 100.2  Mildly elevated troponin 0.05, reactionary  Hospital day # 1  This patient is critically ill due to cerebellar hemorrhage, status post surgical evacuation, cerebral edema, hypertensive emergency, A. fib on anticoagulation and at significant risk of neurological worsening, death form hematoma expansion, brain herniation, heart failure, seizure. This patient's care requires constant monitoring of vital signs, hemodynamics, respiratory and cardiac monitoring, review of multiple databases, neurological assessment, discussion with family, other specialists and medical decision making of high complexity. I spent 40 minutes of neurocritical care time in the care of this patient. I had long discussion with son Rush Landmark over the phone, updated pt current condition, treatment plan and potential prognosis. He expressed understanding and appreciation.   Rosalin Hawking, MD PhD Stroke Neurology 12/31/2018 4:54 PM     To contact Stroke Continuity provider, please refer to [email or web address] After hours, contact General Neurology

## 2018-12-31 NOTE — Progress Notes (Signed)
SLP Cancellation Note  Patient Details Name: Randy [name] MRN: [id number] DOB: Feb 07, 1946   Cancelled treatment:       Reason Eval/Treat Not Completed: Medical issues which prohibited therapy(Pt is intubated at this time. SLP will follow up. )  Kristan Brummitt I. Hardin Negus, [place], [place] Office number [phone] Pager Deshanae Lindo 12/31/2018, 7:48 AM

## 2018-12-31 NOTE — Progress Notes (Addendum)
NAME:  Randy [name], MRN:  [id number], DOB:  25-Dec-1945, LOS: 1 ADMISSION DATE:  12/28/2018, CONSULTATION DATE: June 17 REFERRING MD: Dr. Rory Percy, CHIEF COMPLAINT: Intraparenchymal hemorrhage  Brief History   73 year old male anticoagulated with Xarelto presented with intraparenchymal hematoma and was emergently taken to the OR for evacuation.  Past Medical History  Tobacco Abuse  PE  PAF - on Xarelto NSTEMI  HTN  ETOH Abuse   Significant Hospital Events   6/17 Admit with cerebellar ICH. To OR for evacuation. Out to ICU intubated.   Consults:  Neurosurgery PCCM  Procedures:  ETT 6/17 >  Art line 6/17 >  Significant Diagnostic Tests:  CT head 6/17 > Acute intraparenchymal hemorrhage within the left cerebellum with volume of 28 mL and intraventricular extension to the third and fourth ventricles. 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 CTA head 6/17 > Severely motion degraded study. No proximal large vessel occlusion of the anterior circulation. The hemorrhage pattern of the left cerebellum is most consistent with hypertensive hemorrhage.  Micro Data:  SARS-CoV-2 6/17 > neg  Antimicrobials:  Periop cefazolin 6/17  Interim history/subjective:  Tmax 99.9.  Propofol gtt turned off but received IV fentanyl push ~ 15 minutes prior to NP exam.   Objective   Blood pressure (!) 143/69, pulse 84, temperature 99.9 F (37.7 C), resp. rate 18, height 5\' 4"  (1.626 m), weight 68.1 kg, SpO2 98 %.    Vent Mode: PSV;CPAP FiO2 (%):  [40 %-100 %] 40 % Set Rate:  [20 bmp] 20 bmp Vt Set:  [470 mL] 470 mL PEEP:  [5 cmH20] 5 cmH20 Pressure Support:  [10 cmH20] 10 cmH20 Plateau Pressure:  [15 cmH20-22 cmH20] 15 cmH20   Intake/Output Summary (Last 24 hours) at 12/31/2018 7591 Last data filed at 12/31/2018 0800 Gross per 24 hour  Intake 2205.87 ml  Output 1725 ml  Net 480.87 ml   Filed Weights   01/09/2019 1352 12/25/2018 2245  Weight: 68 kg 68.1 kg    Examination: General: ill appearing adult male lying in bed on vent in NAD  HEENT: MM pink/moist, ETT Neuro: opens eyes to voice, follows commands, generalized weakness  CV: s1s2 rrr, 2-3/6 SEM heard best at RSB, 2nd ICS PULM: even/non-labored, lungs bilaterally clear  MB:WGYK, non-tender, bsx4 active  Extremities: warm/dry, no edema  Skin: no rashes or lesions  Resolved Hospital Problem list     Assessment & Plan:   Cerebellar Intraparenchymal Hemorrhage -s/p evacuation 6/17 P: Post operative care per NSGY and Neurology  SBP Goal <140  Cleviprex for above goals Decadron taper   Inability to protect airway  -in setting of IPH P: PRVC as rest mode  SBT with WUA, goal for extubation 6/18  No plan for return to OR per NSGY notes, ok for extubation  Follow intermittent CXR  Propofol + PRN fentanyl for pain / sedation if needed while on vent   Atrial fibrillation on Xarelto -currently sinus on tele. Coagulopathy reversed with [facility] in ED.  Hypertension P: ICU monitoring  Hold all anticoagulation  Continue losartan, metoprolol PT  NAG Acidosis P: Improving, follow BMP   Anemia  -mild  P: Trend CBC  Transfuse per ICU guidelines   Hyperglycemia  P: SSI   History of ETOH Abuse  -remote and he has not used for about 10 years: LFT normal on arrival  P: Supportive care   Best practice:  Diet: NPO Pain/Anxiety/Delirium protocol (if indicated): Propofol  infusion, Fentanyl PRN VAP protocol (if indicated): Per protocol DVT prophylaxis: per primary GI prophylaxis: PPI Glucose control: SSI Mobility: BR Code Status: FULL Family Communication: Per primary  Disposition: ICU  Labs   CBC: Recent Labs  Lab 12/18/2018 1338 12/29/2018 1345 12/31/18 0540  WBC 14.8*  --  12.5*  HGB 15.9 17.0 12.9*  HCT 48.9 50.0 38.2*  MCV 91.9  --  89.9  PLT 233  --  893    Basic Metabolic Panel: Recent Labs  Lab 12/31/2018  1339 01/11/2019 1345 12/31/18 0540  NA 137 137 136  K 5.0 4.9 3.8  CL 106 105 106  CO2 19*  --  20*  GLUCOSE 139* 139* 130*  BUN 18 22 15   CREATININE 1.17 1.00 1.16  CALCIUM 9.2  --  8.6*   GFR: Estimated Creatinine Clearance: 48.9 mL/min (by C-G formula based on SCr of 1.16 mg/dL). Recent Labs  Lab 12/14/2018 1338 12/31/18 0540  WBC 14.8* 12.5*    Liver Function Tests: Recent Labs  Lab 01/03/2019 1339  AST 34  ALT 30  ALKPHOS 41  BILITOT 1.2  PROT 6.8  ALBUMIN 4.1   Recent Labs  Lab 12/31/2018 1338  LIPASE 28   No results for input(s): AMMONIA in the last 168 hours.  ABG    Component Value Date/Time   PHART 7.343 (L) 12/20/2018 2317   PCO2ART 42.3 12/14/2018 2317   PO2ART 109 (H) 12/29/2018 2317   HCO3 22.4 12/23/2018 2317   TCO2 25 12/21/2018 1345   ACIDBASEDEF 2.5 (H) 12/28/2018 2317   O2SAT 97.7 12/20/2018 2317     Coagulation Profile: Recent Labs  Lab 12/25/2018 1338 12/31/18 0540  INR 1.6* 1.2    Cardiac Enzymes: Recent Labs  Lab 01/07/2019 1338  TROPONINI 0.05*    HbA1C: Hgb A1c MFr Bld  Date/Time Value Ref Range Status  12/22/2018 06:03 PM 6.1 (H) 4.8 - 5.6 % Final    Comment:    (NOTE) Pre diabetes:          5.7%-6.4% Diabetes:              >6.4% Glycemic control for   <7.0% adults with diabetes   06/29/2018 01:44 PM 5.4 4.8 - 5.6 % Final    Comment:    (NOTE) Pre diabetes:          5.7%-6.4% Diabetes:              >6.4% Glycemic control for   <7.0% adults with diabetes     CBG: Recent Labs  Lab 12/29/2018 1359 12/28/2018 2329 12/31/18 0327 12/31/18 0737  GLUCAP 114* 90 129* 144*    Critical care time: 30 minutes     Noe Gens, NP-C [facility] Pgr: [phone] or if no answer [phone] 12/31/2018, 8:40 AM

## 2018-12-31 NOTE — Progress Notes (Signed)
Patient agitated at present, precedex just started per RN.  Will attempt to place later.  Carolee Rota, RN VAST

## 2018-12-31 NOTE — Procedures (Signed)
Extubation Procedure Note  Patient Details:   Name: Parth Mccormac DOB: 14-Nov-1945 MRN: [id number]   Airway Documentation:    Vent end date: 12/31/18 Vent end time: 1015   Evaluation  O2 sats: stable throughout Complications: No apparent complications Patient did tolerate procedure well. Bilateral Breath Sounds: Clear, Diminished   Yes   Patient doing well at this time, placed on 4L [place]. There was a cuff leak present prior to extubation. Patient was able to say his name and where he was. Will continue to monitor.  Eldora Napp A Sheryl Towell 12/31/2018, 10:19 AM

## 2018-12-31 NOTE — Progress Notes (Signed)
Patient ID: Randy [name], male   DOB: 1946-05-20, 73 y.o.   MRN: [id number] Even on propofol the patient will squeeze and release my hand, he opened his eyes briefly, wiggles his toes for me.  His CT scan was reviewed and shows some mild rounding of the third ventricle and some temporal tips but no expansion of the lateral ventricles.  The resection site looks quite good.  There is some blood in the third and fourth ventricle.  Overall I suspect he is doing better than expected this morning.  I do not believe he needs a ventriculostomy at this point I would lean toward weaning his propofol and weaning his ventilator We will repeat his CT scan of the head in the morning

## 2018-12-31 NOTE — Progress Notes (Addendum)
Order to keep foley per Dr. Erlinda Hong   3% started in R hand PIV at [facility] 6/18

## 2018-12-31 NOTE — Progress Notes (Signed)
Subjective: Patient intubated and sedated  Objective: Vital signs in last 24 hours: Temp:  [97 F (36.1 C)-100 F (37.8 C)] 100 F (37.8 C) (06/18 0445) Pulse Rate:  [49-82] 62 (06/18 0445) Resp:  [14-22] 20 (06/18 0445) BP: (109-288)/(53-96) 119/60 (06/18 0445) SpO2:  [94 %-100 %] 100 % (06/18 0445) Arterial Line BP: (113-182)/(49-83) 123/53 (06/18 0445) FiO2 (%):  [40 %-100 %] 40 % (06/18 0400) Weight:  [68 kg-68.1 kg] 68.1 kg (06/17 2245)  Intake/Output from previous day: 06/17 0701 - 06/18 0700 In: 2037.8 [I.V.:1338.7; Blood:649; IV Piggyback:50] Out: 3536 [Urine:975; Blood:50] Intake/Output this shift: Total I/O In: 1537.8 [I.V.:838.7; Blood:649; IV Piggyback:50] Out: 1025 [Urine:975; Blood:50]  Neurologic: Sedated and intubated, withdraws to noxious stimuli   Lab Results: Lab Results  Component Value Date   WBC 14.8 (H) 12/15/2018   HGB 17.0 12/29/2018   HCT 50.0 12/24/2018   MCV 91.9 12/21/2018   PLT 233 12/18/2018   Lab Results  Component Value Date   INR 1.6 (H) 12/27/2018   BMET Lab Results  Component Value Date   NA 137 12/28/2018   K 4.9 01/04/2019   CL 105 12/20/2018   CO2 19 (L) 01/08/2019   GLUCOSE 139 (H) 12/16/2018   BUN 22 01/03/2019   CREATININE 1.00 12/22/2018   CALCIUM 9.2 12/21/2018    Studies/Results: Ct Angio Head W Or Wo Contrast  Result Date: 12/25/2018 CLINICAL DATA:  Intracranial hemorrhage EXAM: CT ANGIOGRAPHY HEAD TECHNIQUE: Multidetector CT imaging of the head was performed using the standard protocol during bolus administration of intravenous contrast. Multiplanar CT image reconstructions and MIPs were obtained to evaluate the vascular anatomy. CONTRAST:  141mL OMNIPAQUE IOHEXOL 350 MG/ML SOLN COMPARISON:  Head CT 12/19/2018 FINDINGS: The examination is severely degraded by motion. CTA HEAD FINDINGS POSTERIOR CIRCULATION: Poor visualization of the posterior fossa due to motion. Distal basilar artery and the posterior cerebral  arteries are patent. ANTERIOR CIRCULATION: --Intracranial internal carotid arteries: There is atherosclerotic calcification of both internal carotid arteries. Both are patent, but assessment is otherwise limited by motion. --Anterior cerebral arteries (ACA): No proximal occlusion --Middle cerebral arteries (MCA): No proximal occlusion IMPRESSION: 1. Severely motion degraded study. No proximal large vessel occlusion of the anterior circulation. 2. The hemorrhage pattern of the left cerebellum is most consistent with hypertensive hemorrhage. Electronically Signed   By: Ulyses Jarred M.D.   On: 12/16/2018 17:10   Ct Head Wo Contrast  Result Date: 12/19/2018 CLINICAL DATA:  ALTERED MENTAL STATUS AND HYPERTENSION EXAM: CT HEAD WITHOUT CONTRAST TECHNIQUE: Contiguous axial images were obtained from the base of the skull through the vertex without intravenous contrast. COMPARISON:  None. FINDINGS: Brain: There is a large intraparenchymal hematoma in the left cerebellum in the region of the dentate nucleus, measuring 3.7 x 4.4 x 3.3 cm (volume = 28 cm^3). There is intraventricular extension into the fourth ventricle and third ventricle. There is marked mass effect within the posterior fossa. No hydrocephalus. There is an old right basal ganglia small vessel infarct. Vascular: Atherosclerotic calcification of the vertebral and internal carotid arteries at the skull base. No abnormal hyperdensity of the major intracranial arteries or dural venous sinuses. Skull: The visualized skull base, calvarium and extracranial soft tissues are normal. Sinuses/Orbits: No fluid levels or advanced mucosal thickening of the visualized paranasal sinuses. No mastoid or middle ear effusion. The orbits are normal. IMPRESSION: Acute intraparenchymal hemorrhage within the left cerebellum with volume of 28 mL and intraventricular extension to the third and fourth ventricles. 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 Critical Value/emergent results were called by telephone at the time of interpretation on 12/17/2018 at 4:14 pm to Dr. Shirlyn Goltz , who verbally acknowledged these results. Electronically Signed   By: Ulyses Jarred M.D.   On: 12/25/2018 16:15   Dg Chest Portable 1 View  Result Date: 01/04/2019 CLINICAL DATA:  Chest pain EXAM: PORTABLE CHEST 1 VIEW COMPARISON:  07/30/2018 FINDINGS: Heart size is slightly enlarged. The patient is status post prior median sternotomy. The endotracheal tube terminates approximately 5 cm above the carina. The enteric tube extends below the left hemidiaphragm. The side hole projects over the GE junction. There is no definite pneumothorax, however the left lung field is partially obscured by overlapping defibrillator pad. There is no acute osseous abnormality. There is a minimal amount of atelectasis at the lung bases. No definite pneumothorax. No large pleural effusion. Aortic calcifications are noted. IMPRESSION: 1. Lines and tubes as above. 2. Borderline cardiomegaly. The patient is status post prior median sternotomy. 3. The lungs are clear without evidence of a significant pneumothorax or pleural effusion. Electronically Signed   By: Constance Holster M.D.   On: 01/12/2019 18:22   Dg Abd Portable 1v  Result Date: 12/31/2018 CLINICAL DATA:  OG tube placement EXAM: PORTABLE ABDOMEN - 1 VIEW COMPARISON:  None. FINDINGS: Esophageal tube tip overlies the proximal stomach, side-port in the region of GE junction. Pleural and parenchymal disease at the left base. Mild gas distention of colon with moderate stool. IMPRESSION: Esophageal tube side-port overlies the GE junction, consider further advancement for more optimal positioning Electronically Signed   By: Donavan Foil M.D.   On: 12/16/2018 21:33    Assessment/Plan: 73 year old male postop day 1 left cerebellar craniectomy for evacuation of left cerebellar  intraparenchymal bleed. Still on propofol but will withdraw to noxious stimuli. CT head this morning shows some increase in ventricular size with intraventricular hemorrhage   LOS: 1 day    Randy [name] 12/31/2018, 5:46 AM

## 2018-12-31 NOTE — Progress Notes (Signed)
Pt had small episode of vomiting during bath, Dr. Erlinda Hong notified. No new orders, will continue to monitor

## 2019-01-01 ENCOUNTER — Inpatient Hospital Stay (HOSPITAL_COMMUNITY): Payer: Medicare Other

## 2019-01-01 DIAGNOSIS — Z9889 Other specified postprocedural states: Secondary | ICD-10-CM

## 2019-01-01 DIAGNOSIS — D181 Lymphangioma, any site: Secondary | ICD-10-CM

## 2019-01-01 DIAGNOSIS — I480 Paroxysmal atrial fibrillation: Secondary | ICD-10-CM

## 2019-01-01 DIAGNOSIS — J988 Other specified respiratory disorders: Secondary | ICD-10-CM

## 2019-01-01 LAB — CBC
HCT: 36.6 % — ABNORMAL LOW (ref 39.0–52.0)
Hemoglobin: 12.2 g/dL — ABNORMAL LOW (ref 13.0–17.0)
MCH: 30.5 pg (ref 26.0–34.0)
MCHC: 33.3 g/dL (ref 30.0–36.0)
MCV: 91.5 fL (ref 80.0–100.0)
Platelets: 167 10*3/uL (ref 150–400)
RBC: 4 MIL/uL — ABNORMAL LOW (ref 4.22–5.81)
RDW: 15.2 % (ref 11.5–15.5)
WBC: 15.8 10*3/uL — ABNORMAL HIGH (ref 4.0–10.5)
nRBC: 0 % (ref 0.0–0.2)

## 2019-01-01 LAB — URINALYSIS, COMPLETE (UACMP) WITH MICROSCOPIC
Bacteria, UA: NONE SEEN
Bilirubin Urine: NEGATIVE
Glucose, UA: NEGATIVE mg/dL
Ketones, ur: NEGATIVE mg/dL
Leukocytes,Ua: NEGATIVE
Nitrite: NEGATIVE
Protein, ur: NEGATIVE mg/dL
Specific Gravity, Urine: 1.014 (ref 1.005–1.030)
pH: 5 (ref 5.0–8.0)

## 2019-01-01 LAB — GLUCOSE, CAPILLARY
Glucose-Capillary: 133 mg/dL — ABNORMAL HIGH (ref 70–99)
Glucose-Capillary: 142 mg/dL — ABNORMAL HIGH (ref 70–99)
Glucose-Capillary: 125 mg/dL — ABNORMAL HIGH (ref 70–99)
Glucose-Capillary: 112 mg/dL — ABNORMAL HIGH (ref 70–99)
Glucose-Capillary: 136 mg/dL — ABNORMAL HIGH (ref 70–99)
Glucose-Capillary: 161 mg/dL — ABNORMAL HIGH (ref 70–99)

## 2019-01-01 LAB — BASIC METABOLIC PANEL
Anion gap: 9 (ref 5–15)
BUN: 25 mg/dL — ABNORMAL HIGH (ref 8–23)
CO2: 19 mmol/L — ABNORMAL LOW (ref 22–32)
Calcium: 8.4 mg/dL — ABNORMAL LOW (ref 8.9–10.3)
Chloride: 116 mmol/L — ABNORMAL HIGH (ref 98–111)
Creatinine, Ser: 1.02 mg/dL (ref 0.61–1.24)
GFR calc Af Amer: >60 mL/min (ref >60)
GFR calc non Af Amer: >60 mL/min (ref >60)
Glucose, Bld: 148 mg/dL — ABNORMAL HIGH (ref 70–99)
Potassium: 3.8 mmol/L (ref 3.5–5.1)
Sodium: 144 mmol/L (ref 135–145)

## 2019-01-01 LAB — SODIUM
Sodium: 147 mmol/L — ABNORMAL HIGH (ref 135–145)
Sodium: 148 mmol/L — ABNORMAL HIGH (ref 135–145)

## 2019-01-01 MED ORDER — CHLORHEXIDINE GLUCONATE 0.12 % MT SOLN
15.0000 mL | Freq: Two times a day (BID) | OROMUCOSAL | Status: DC
  Administered 2019-01-01 (×2): 15 mL via OROMUCOSAL
  Filled 2019-01-01: qty 15

## 2019-01-01 MED ORDER — SODIUM CHLORIDE 3 % IN NEBU
4.0000 mL | INHALATION_SOLUTION | Freq: Four times a day (QID) | RESPIRATORY_TRACT | Status: DC
  Administered 2019-01-01 – 2019-01-02 (×6): 4 mL via RESPIRATORY_TRACT
  Filled 2019-01-01 (×7): qty 4

## 2019-01-01 MED ORDER — ORAL CARE MOUTH RINSE
15.0000 mL | Freq: Two times a day (BID) | OROMUCOSAL | Status: DC
  Administered 2019-01-01 (×2): 15 mL via OROMUCOSAL

## 2019-01-01 MED ORDER — PRO-STAT SUGAR FREE PO LIQD
30.0000 mL | Freq: Two times a day (BID) | ORAL | Status: DC
  Administered 2019-01-01 – 2019-01-04 (×6): 30 mL
  Filled 2019-01-01 (×6): qty 30

## 2019-01-01 MED ORDER — VITAL AF 1.2 CAL PO LIQD
1000.0000 mL | ORAL | Status: DC
  Administered 2019-01-01 – 2019-01-03 (×3): 1000 mL
  Filled 2019-01-01: qty 1000

## 2019-01-01 NOTE — Evaluation (Addendum)
Clinical/Bedside Swallow Evaluation Patient Details  Name: Randy [name] MRN: [id number] Date of Birth: July 24, 1945  Today's Date: 01/01/2019 Time: SLP Start Time (ACUTE ONLY): 1000 SLP Stop Time (ACUTE ONLY): 1015 SLP Time Calculation (min) (ACUTE ONLY): 15 min  Past Medical History:  Past Medical History:  Diagnosis Date  . Alcohol abuse    Last drink was 10 years ago (2009).  Marland Kitchen Hypertension   . NSTEMI (non-ST elevated myocardial infarction) ([place]) 06/29/2018   s/p CABG w/ LIMA-LAD, SVG-diagonal, SVG-OM, SVG-PLV  . PAF (paroxysmal atrial fibrillation) ([place]) 06/2018   Postop CABG  . Pulmonary embolus ([place]) 06/29/2018  . Tobacco abuse    Past Surgical History:  Past Surgical History:  Procedure Laterality Date  . CORONARY ARTERY BYPASS GRAFT N/A 07/01/2018   Procedure: CORONARY ARTERY BYPASS GRAFTING (CABG) x 4, LIMA TO LAD, SVG TO OM1, SVG TO DIAG, SVG TO PL, USING LEFT INTERNAL MAMMARY ARTERY AND RIGHT GREATER SAPHENOUS VEIN HARVESTED ENDOSCOPICALLY;  Surgeon: Grace Isaac, MD;  Location: [place];  Service: Open Heart Surgery;  Laterality: N/A;  . IABP INSERTION Right 06/29/2018   Procedure: IABP Insertion;  Surgeon: Sherren Mocha, MD;  Location: [place] [facility];  Service: Cardiovascular;  Laterality: Right;  . LEFT HEART CATH AND CORONARY ANGIOGRAPHY N/A 06/29/2018   Procedure: LEFT HEART CATH AND CORONARY ANGIOGRAPHY;  Surgeon: Sherren Mocha, MD;  Location: [facility];  Service: Cardiovascular;  Laterality: N/A;  . SUBOCCIPITAL CRANIECTOMY CERVICAL LAMINECTOMY N/A 12/16/2018   Procedure: SUBOCCIPITAL CRANIECTOMY FOR CEREBELLAR HEMATOMA;  Surgeon: Eustace Moore, MD;  Location: [place];  Service: Neurosurgery;  Laterality: N/A;  . TEE WITHOUT CARDIOVERSION N/A 07/01/2018   Procedure: TRANSESOPHAGEAL ECHOCARDIOGRAM (TEE);  Surgeon: Grace Isaac, MD;  Location: [place];  Service: Open Heart Surgery;  Laterality: N/A;   HPI:  Patient is a 73 y.o. male admitted after  almost passing out. CT revealed large left cerebellar hemorrhage. MRI showed large left cerebellar hemorrhage with intraventricular extension without hydrocephalus. Intubated 6/17-6/18. Underwent craniotomy of occipital region 6/17. CXR The lungs are clear without evidence of a significant pneumothorax or pleural effusion. PMH: tobacco abuse, PE, NSTEMI, ETOH abuse, HTN.   Assessment / Plan / Recommendation Clinical Impression  Pt exhibits subtle signs of potential aspiration with thin. Immediate and delayed throat clears elicited with attempts at consecutive cup and straw drinking. Labial spill and decreased bolus cohesion and pt stating "it's falling under my tongue". He also demonstrated reduced respiratory support for coughing and significant audible chest/pharyngeal congestion which he cannot expecorate and dyspnea during evaluation. Motor speech deficit with moderate dysarthria. Recommend continue NPO allowing intermittent ice chips after oral care. ST will follow for dysphagia management and intervention (see speech-langu-cog eval as well).   SLP Visit Diagnosis: Dysphagia, unspecified (R13.10)    Aspiration Risk  Mild aspiration risk;Moderate aspiration risk    Diet Recommendation Ice chips PRN after oral care   Medication Administration: Via alternative means    Other  Recommendations Oral Care Recommendations: Oral care QID   Follow up Recommendations Inpatient Rehab      Frequency and Duration min 2x/week  2 weeks       Prognosis Prognosis for Safe Diet Advancement: Good      Swallow Study   General HPI: Patient is a 73 y.o. male admitted after almost passing out. CT revealed large left cerebellar hemorrhage. MRI showed large left cerebellar hemorrhage with intraventricular extension without hydrocephalus. Intubated 6/17-6/18. CXR The lungs are clear without evidence  of a significant pneumothorax or pleural effusion. PMH: tobacco abuse, PE, NSTEMI, ETOH abuse, HTN. Type of  Study: Bedside Swallow Evaluation Previous Swallow Assessment: (none) Diet Prior to this Study: NPO Temperature Spikes Noted: No Respiratory Status: Nasal cannula History of Recent Intubation: Yes Length of Intubations (days): 1 days Date extubated: 12/31/18 Behavior/Cognition: Alert;Cooperative;Pleasant mood;Requires cueing Oral Cavity Assessment: Dry Oral Care Completed by SLP: Yes Oral Cavity - Dentition: Adequate natural dentition Vision: (visual disturbance) Self-Feeding Abilities: Needs assist;Needs set up Patient Positioning: Upright in bed Baseline Vocal Quality: Other (comment)(congested, deep pitch, ? resonance) Volitional Cough: Weak Volitional Swallow: Able to elicit    Oral/Motor/Sensory Function Overall Oral Motor/Sensory Function: Mild impairment(not significantly weak, slight on right) Facial ROM: Reduced right;Suspected CN VII (facial) dysfunction Facial Symmetry: Abnormal symmetry right;Suspected CN VII (facial) dysfunction Lingual ROM: Within Functional Limits Lingual Symmetry: Within Functional Limits Mandible: Within Functional Limits   Ice Chips Ice chips: Impaired Presentation: Spoon Oral Phase Impairments: Reduced lingual movement/coordination;Other (comment)(states fell under tongue) Oral Phase Functional Implications: Prolonged oral transit Pharyngeal Phase Impairments: (none)   Thin Liquid Thin Liquid: Impaired Presentation: Straw;Spoon Oral Phase Impairments: Reduced labial seal;Reduced lingual movement/coordination Oral Phase Functional Implications: Right anterior spillage Pharyngeal  Phase Impairments: Throat Clearing - Immediate;Throat Clearing - Delayed    Nectar Thick Nectar Thick Liquid: Not tested   Honey Thick Honey Thick Liquid: Not tested   Puree Puree: Impaired Presentation: Spoon Oral Phase Impairments: Reduced lingual movement/coordination Oral Phase Functional Implications: Oral residue Pharyngeal Phase Impairments: Other  (comments)(none)   Solid     Solid: Not tested      Houston Siren 01/01/2019,10:33 AM   Orbie Pyo Colvin Caroli.Ed Risk analyst [phone] Office [phone]

## 2019-01-01 NOTE — Evaluation (Addendum)
Physical Therapy Evaluation Patient Details Name: Randy [name] MRN: [id number] DOB: 26-Jul-1945 Today's Date: 01/01/2019   History of Present Illness  73 yo male admitted with Left cerebellar ICH with IVH on Xarelto and aspirin, s/p Bartholomew reversal and suboccipital hematoma evacuation PMH: Alcohol abuse, Hypertension, NSTEMI (non-ST elevated myocardial infarction)  (06/29/2018), PAF (paroxysmal atrial fibrillation)(06/2018), Pulmonary embolus (06/29/2018), and Tobacco abuse.  Clinical Impression  Pt admitted with above. Presenting with decreased functional mobility secondary to ataxic gait, decreased gross motor coordination, weakness, balance impairments, cognitive deficits, gait abnormalities, visual deficits. Requiring two person moderate assist for transfers and ambulating x 20 feet with walker and close chair follow. Noted left knee instability. Pt is an excellent candidate for CIR based on motivation and PLOF. Suspect can progress to modI level.    Follow Up Recommendations CIR;Supervision/Assistance - 24 hour    Equipment Recommendations  Rolling walker with 5" wheels    Recommendations for Other Services Rehab consult     Precautions / Restrictions Precautions Precautions: Fall Restrictions Weight Bearing Restrictions: No      Mobility  Bed Mobility               General bed mobility comments: in chair on arrival  Transfers Overall transfer level: Needs assistance Equipment used: Rolling walker (2 wheeled) Transfers: Sit to/from Stand Sit to Stand: +2 physical assistance;Mod assist         General transfer comment: pt requires cues for hand placement and posture. pt able to power up from chair with (A). pt with Hand over hand placement of R to reach for RW  Ambulation/Gait Ambulation/Gait assistance: Mod assist;+2 physical assistance Gait Distance (Feet): 20 Feet Assistive device: Rolling walker (2 wheeled) Gait Pattern/deviations: Step-to pattern;Step-through  pattern;Ataxic;Narrow base of support;Trunk flexed Gait velocity: decreased Gait velocity interpretation: <1.8 ft/sec, indicate of risk for recurrent falls General Gait Details: Cues for glute, left quad activation, upright posture, sequencing. Pt with intermittent left knee buckle versus hyperextension in mid stance. Close chair follow utilized, ataxic gait pattern with decreased gross motor coordination  Stairs            Wheelchair Mobility    Modified Rankin (Stroke Patients Only) Modified Rankin (Stroke Patients Only) Pre-Morbid Rankin Score: No symptoms Modified Rankin: Moderately severe disability     Balance Overall balance assessment: Needs assistance         Standing balance support: Bilateral upper extremity supported;During functional activity Standing balance-Leahy Scale: Poor Standing balance comment: reliant on RW                             Pertinent Vitals/Pain Pain Assessment: Faces Faces Pain Scale: Hurts little more Pain Intervention(s): Monitored during session    Home Living Family/patient expects to be discharged to:: Private residence Living Arrangements: Alone Available Help at Discharge: Family(brother, sister, son) Type of Home: Apartment Home Access: Level entry     Home Layout: One level Home Equipment: None      Prior Function Level of Independence: Independent         Comments: Drives, takes care of granddaughter occasionally     Hand Dominance   Dominant Hand: Right    Extremity/Trunk Assessment   Upper Extremity Assessment Upper Extremity Assessment: Generalized weakness    Lower Extremity Assessment Lower Extremity Assessment: RLE deficits/detail;LLE deficits/detail RLE Deficits / Details: Strength 5/5 RLE Coordination: decreased gross motor LLE Deficits / Details: Strength 5/5 LLE Coordination: decreased gross motor  Cervical / Trunk Assessment Cervical / Trunk Assessment: Other  exceptions Cervical / Trunk Exceptions: s/p crani   Communication   Communication: Expressive difficulties  Cognition Arousal/Alertness: Awake/alert Behavior During Therapy: WFL for tasks assessed/performed Overall Cognitive Status: Impaired/Different from baseline Area of Impairment: Attention;Memory;Following commands;Safety/judgement;Awareness                   Current Attention Level: Selective Memory: Decreased recall of precautions Following Commands: Follows multi-step commands inconsistently;Follows multi-step commands with increased time Safety/Judgement: Decreased awareness of safety;Decreased awareness of deficits Awareness: Emergent   General Comments: pt able to verbalize location and where he was at the time of event.       General Comments      Exercises     Assessment/Plan    PT Assessment Patient needs continued PT services  PT Problem List Decreased strength;Decreased activity tolerance;Decreased balance;Decreased mobility;Decreased coordination;Decreased cognition;Decreased safety awareness       PT Treatment Interventions DME instruction;Gait training;Therapeutic activities;Functional mobility training;Therapeutic exercise;Balance training;Neuromuscular re-education;Cognitive remediation;Patient/family education    PT Goals (Current goals can be found in the Care Plan section)  Acute Rehab PT Goals Patient Stated Goal: to walk again PT Goal Formulation: With patient Time For Goal Achievement: 01/15/19 Potential to Achieve Goals: Good    Frequency Min 4X/week   Barriers to discharge        Co-evaluation PT/OT/SLP Co-Evaluation/Treatment: Yes Reason for Co-Treatment: Complexity of the patient's impairments (multi-system involvement);Necessary to address cognition/behavior during functional activity;For patient/therapist safety;To address functional/ADL transfers PT goals addressed during session: Mobility/safety with mobility OT goals  addressed during session: ADL's and self-care;Proper use of Adaptive equipment and DME;Strengthening/ROM       AM-PAC PT "6 Clicks" Mobility  Outcome Measure Help needed turning from your back to your side while in a flat bed without using bedrails?: A Little Help needed moving from lying on your back to sitting on the side of a flat bed without using bedrails?: A Lot Help needed moving to and from a bed to a chair (including a wheelchair)?: A Lot Help needed standing up from a chair using your arms (e.g., wheelchair or bedside chair)?: A Lot Help needed to walk in hospital room?: A Lot Help needed climbing 3-5 steps with a railing? : Total 6 Click Score: 12    End of Session Equipment Utilized During Treatment: Gait belt Activity Tolerance: Patient tolerated treatment well Patient left: in chair;with call bell/phone within reach;with chair alarm set Nurse Communication: Mobility status PT Visit Diagnosis: Unsteadiness on feet (R26.81);Other abnormalities of gait and mobility (R26.89);Muscle weakness (generalized) (M62.81);Ataxic gait (R26.0);Difficulty in walking, not elsewhere classified (R26.2);Other symptoms and signs involving the nervous system (R29.898)    Time: 9735-3299 PT Time Calculation (min) (ACUTE ONLY): 29 min   Charges:   PT Evaluation $PT Eval Moderate Complexity: 1 Mod          Ellamae Sia, [place], DPT Acute Rehabilitation Services Pager [phone] Office [phone]   Willy Eddy 01/01/2019, 3:51 PM

## 2019-01-01 NOTE — Progress Notes (Signed)
Patient ID: Randy [name], male   DOB: 01-04-1946, 73 y.o.   MRN: [id number] Subjective: Patient reports he feels better  Objective: Vital signs in last 24 hours: Temp:  [97.5 F (36.4 C)-99.9 F (37.7 C)] 98.2 F (36.8 C) (06/19 0700) Pulse Rate:  [40-88] 51 (06/19 0700) Resp:  [9-26] 20 (06/19 0700) BP: (101-162)/(52-100) 122/58 (06/19 0700) SpO2:  [94 %-98 %] (P) 95 % (06/19 0400) Arterial Line BP: (110-164)/(42-73) 135/63 (06/19 0700) FiO2 (%):  [40 %] 40 % (06/18 0754)  Intake/Output from previous day: 06/18 0701 - 06/19 0700 In: 2238.4 [I.V.:2188.4; IV Piggyback:50] Out: 900 [Urine:900] Intake/Output this shift: No intake/output data recorded.  extubated, talking, no focal deficit  Lab Results: Lab Results  Component Value Date   WBC 15.8 (H) 01/01/2019   HGB 12.2 (L) 01/01/2019   HCT 36.6 (L) 01/01/2019   MCV 91.5 01/01/2019   PLT 167 01/01/2019   Lab Results  Component Value Date   INR 1.2 12/31/2018   BMET Lab Results  Component Value Date   NA 144 01/01/2019   K 3.8 01/01/2019   CL 116 (H) 01/01/2019   CO2 19 (L) 01/01/2019   GLUCOSE 148 (H) 01/01/2019   BUN 25 (H) 01/01/2019   CREATININE 1.02 01/01/2019   CALCIUM 8.4 (L) 01/01/2019    Studies/Results: Ct Angio Head W Or Wo Contrast  Result Date: 12/16/2018 CLINICAL DATA:  Intracranial hemorrhage EXAM: CT ANGIOGRAPHY HEAD TECHNIQUE: Multidetector CT imaging of the head was performed using the standard protocol during bolus administration of intravenous contrast. Multiplanar CT image reconstructions and MIPs were obtained to evaluate the vascular anatomy. CONTRAST:  142mL OMNIPAQUE IOHEXOL 350 MG/ML SOLN COMPARISON:  Head CT 12/29/2018 FINDINGS: The examination is severely degraded by motion. CTA HEAD FINDINGS POSTERIOR CIRCULATION: Poor visualization of the posterior fossa due to motion. Distal basilar artery and the posterior cerebral arteries are patent. ANTERIOR CIRCULATION: --Intracranial internal  carotid arteries: There is atherosclerotic calcification of both internal carotid arteries. Both are patent, but assessment is otherwise limited by motion. --Anterior cerebral arteries (ACA): No proximal occlusion --Middle cerebral arteries (MCA): No proximal occlusion IMPRESSION: 1. Severely motion degraded study. No proximal large vessel occlusion of the anterior circulation. 2. The hemorrhage pattern of the left cerebellum is most consistent with hypertensive hemorrhage. Electronically Signed   By: Ulyses Jarred M.D.   On: 01/03/2019 17:10   Ct Head Wo Contrast  Result Date: 01/01/2019 CLINICAL DATA:  Follow-up known intracranial hemorrhage EXAM: CT HEAD WITHOUT CONTRAST TECHNIQUE: Contiguous axial images were obtained from the base of the skull through the vertex without intravenous contrast. COMPARISON:  Yesterday and day before FINDINGS: Brain: Motion degraded but adequate for hemorrhage follow-up. The left cerebellar hematoma evacuation site shows no interval hemorrhage. Gas/hemostatic material in this location is being replaced by edematous parenchyma. Intraventricular hemorrhage at the level of the fourth ventricle, third ventricle, and occipital horns is unchanged. Lateral ventricular volume has diminished and there is no longer any temporal ballooning. Possible small hygroma along the right cerebral hemisphere, without associated mass effect. No evidence of interval infarct. Vascular: Atherosclerotic calcification showing vertebrobasilar tortuosity. Skull: Unremarkable left suboccipital craniectomy. Sinuses/Orbits: Negative IMPRESSION: 1. Stable volume of intracranial hemorrhage that is mainly intraventricular. 2. Interval normalization of lateral ventricular volume. Electronically Signed   By: Monte Fantasia M.D.   On: 01/01/2019 06:35   Ct Head Wo Contrast  Result Date: 12/31/2018 CLINICAL DATA:  Intracranial hemorrhage. EXAM: CT HEAD WITHOUT CONTRAST TECHNIQUE: Contiguous axial images were  obtained from the base of the skull through the vertex without intravenous contrast. COMPARISON:  CT head without contrast 01/04/2019 FINDINGS: Brain: The patient is status post suboccipital craniotomy. The left cerebellar hemorrhage was evacuated. Fourth ventricle is decompressed. There is some residual blood products in the surgical cavity. Intraventricular hemorrhage is again noted within the fourth ventricle and aqueduct of Sylvius to the third ventricle. There is increased blood layering in the lateral ventricles bilaterally. There is no hydrocephalus. Minimal pneumocephalus is now present. Remote periventricular white matter changes are stable. No acute supratentorial cortical abnormality is present. Vascular: Atherosclerotic calcifications are present within the cavernous internal carotid arteries bilaterally. Left vertebral artery calcifications are present. There is no hyperdense vessel. Skull: Suboccipital craniotomy is noted. Calvarium is otherwise intact. No focal lytic or blastic lesions are present. Sinuses/Orbits: The paranasal sinuses and mastoid air cells are clear. The globes and orbits are within normal limits. IMPRESSION: 1. Left suboccipital craniotomy for evacuation of left cerebellar hemorrhage. 2. Decreased mass effect on the fourth ventricle and in the posterior fossa. 3. Intraventricular hemorrhage now evident in the fourth ventricle, aqueduct of Sylvius, third ventricle, and layering in the lateral ventricles bilaterally. 4. Minimal pneumocephalus as expected. 5. Atherosclerosis Electronically Signed   By: San Morelle M.D.   On: 12/31/2018 07:31   Ct Head Wo Contrast  Result Date: 12/18/2018 CLINICAL DATA:  ALTERED MENTAL STATUS AND HYPERTENSION EXAM: CT HEAD WITHOUT CONTRAST TECHNIQUE: Contiguous axial images were obtained from the base of the skull through the vertex without intravenous contrast. COMPARISON:  None. FINDINGS: Brain: There is a large intraparenchymal hematoma  in the left cerebellum in the region of the dentate nucleus, measuring 3.7 x 4.4 x 3.3 cm (volume = 28 cm^3). There is intraventricular extension into the fourth ventricle and third ventricle. There is marked mass effect within the posterior fossa. No hydrocephalus. There is an old right basal ganglia small vessel infarct. Vascular: Atherosclerotic calcification of the vertebral and internal carotid arteries at the skull base. No abnormal hyperdensity of the major intracranial arteries or dural venous sinuses. Skull: The visualized skull base, calvarium and extracranial soft tissues are normal. Sinuses/Orbits: No fluid levels or advanced mucosal thickening of the visualized paranasal sinuses. No mastoid or middle ear effusion. The orbits are normal. IMPRESSION: Acute intraparenchymal hemorrhage within the left cerebellum with volume of 28 mL and intraventricular extension to the third and fourth ventricles. 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 Critical Value/emergent results were called by telephone at the time of interpretation on 12/28/2018 at 4:14 pm to Dr. Shirlyn Goltz , who verbally acknowledged these results. Electronically Signed   By: Ulyses Jarred M.D.   On: 12/22/2018 16:15   Dg Chest Portable 1 View  Result Date: 01/07/2019 CLINICAL DATA:  Chest pain EXAM: PORTABLE CHEST 1 VIEW COMPARISON:  07/30/2018 FINDINGS: Heart size is slightly enlarged. The patient is status post prior median sternotomy. The endotracheal tube terminates approximately 5 cm above the carina. The enteric tube extends below the left hemidiaphragm. The side hole projects over the GE junction. There is no definite pneumothorax, however the left lung field is partially obscured by overlapping defibrillator pad. There is no acute osseous abnormality. There is a minimal amount of atelectasis at the lung bases. No definite pneumothorax. No large  pleural effusion. Aortic calcifications are noted. IMPRESSION: 1. Lines and tubes as above. 2. Borderline cardiomegaly. The patient is status post prior median sternotomy. 3. The  lungs are clear without evidence of a significant pneumothorax or pleural effusion. Electronically Signed   By: Constance Holster M.D.   On: 12/23/2018 18:22   Dg Abd Portable 1v  Result Date: 12/24/2018 CLINICAL DATA:  OG tube placement EXAM: PORTABLE ABDOMEN - 1 VIEW COMPARISON:  None. FINDINGS: Esophageal tube tip overlies the proximal stomach, side-port in the region of GE junction. Pleural and parenchymal disease at the left base. Mild gas distention of colon with moderate stool. IMPRESSION: Esophageal tube side-port overlies the GE junction, consider further advancement for more optimal positioning Electronically Signed   By: Donavan Foil M.D.   On: 12/24/2018 21:33   [place] Ekg Site Rite  Result Date: 12/31/2018 If Site Rite image not attached, placement could not be confirmed due to current cardiac rhythm.   Assessment/Plan: Much better, mobilize  Estimated body mass index is 25.77 kg/m as calculated from the following:   Height as of this encounter: 5\' 4"  (1.626 m).   Weight as of this encounter: 68.1 kg.    LOS: 2 days    Eustace Moore 01/01/2019, 7:27 AM

## 2019-01-01 NOTE — Progress Notes (Signed)
Spoke with pt's brother. Answered questions and gave updates. Family will come by today to pick up pt's car key in order to get the car from [place] parking lot. Pt gave verbal permission to do so. The rest of pt's valuables are still in security and paper chart has been updated. Aniah Pauli, Rande Brunt, RN

## 2019-01-01 NOTE — Evaluation (Signed)
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    Clinical Impression   Patient is s/p L cerebellar ICH with IVH  surgery resulting in functional limitations due to the deficits listed below (see OT problem list). Pt currently total +2 Mod (A) with RW in session. Pt demonstrates basic transfer with cues for posture and sequence. Pt able to verbalize with incr time and thanking therapist for helping him.  Patient will benefit from skilled OT acutely to increase independence and safety with ADLS to allow discharge CIR. Pt is excellent CIR candidate to reach MOD I. .    Follow Up Recommendations  CIR    Equipment Recommendations  3 in 1 bedside commode;Other (comment)(RW)    Recommendations for Other Services Rehab consult     Precautions / Restrictions Precautions Precautions: Fall      Mobility Bed Mobility               General bed mobility comments: in chair on arrival  Transfers Overall transfer level: Needs assistance Equipment used: Rolling walker (2 wheeled) Transfers: Sit to/from Stand Sit to Stand: +2 physical assistance;Mod assist         General transfer comment: pt requires cues for hand placement and posture. pt able to power up from chair with (A). pt with Hand over hand placement of R to reach for RW    Balance Overall balance assessment: Needs assistance         Standing balance support: Bilateral upper extremity supported;During functional activity Standing balance-Leahy Scale: Poor Standing balance comment: reliant on  RW                           ADL either performed or assessed with clinical judgement   ADL Overall ADL's : Needs assistance/impaired Eating/Feeding: NPO   Grooming: Wash/dry face;Minimal assistance;Sitting   Upper Body Bathing: Moderate assistance   Lower Body Bathing: Maximal assistance   Upper Body Dressing : Moderate assistance   Lower Body Dressing: Maximal assistance   Toilet Transfer: +2 for physical assistance;Moderate assistance;RW Toilet Transfer Details (indicate cue type and reason): cues for sequence and posture         Functional mobility during ADLs: +2 for physical assistance;Moderate assistance;Rolling walker General ADL Comments: pt in chair on arrival and progress to door this session with RW.      Vision Patient Visual Report: Diplopia Vision Assessment?: Yes Eye Alignment: Impaired (comment) Ocular Range of Motion: Restricted on the left;Impaired-to be further tested in functional context Alignment/Gaze Preference: Gaze right Tracking/Visual Pursuits: Impaired - to be further tested in functional context;Unable to hold eye position out of midline;Requires cues, head turns, or add eye shifts to track Saccades: Impaired - to be further tested in functional context Convergence: Impaired - to be further tested in functional context Visual Fields: Impaired-to be further tested in functional context Diplopia Assessment: Present all the time/all directions Additional Comments: pt demonstrates R eye movement separate of L eye. R eye attempting to track toward midline with L eye static to R gaze. pt with a resting nystagmus noted as well     Perception  Praxis      Pertinent Vitals/Pain Pain Assessment: Faces Faces Pain Scale: Hurts little more Pain Intervention(s): Monitored during session;Premedicated before session;Repositioned     Hand Dominance Right   Extremity/Trunk Assessment Upper Extremity Assessment Upper Extremity  Assessment: Generalized weakness   Lower Extremity Assessment Lower Extremity Assessment: Defer to PT evaluation   Cervical / Trunk Assessment Cervical / Trunk Assessment: Other exceptions Cervical / Trunk Exceptions: s/p crani    Communication Communication Communication: Expressive difficulties   Cognition Arousal/Alertness: Awake/alert Behavior During Therapy: WFL for tasks assessed/performed Overall Cognitive Status: Impaired/Different from baseline Area of Impairment: Attention;Memory;Following commands;Safety/judgement;Awareness                   Current Attention Level: Selective Memory: Decreased recall of precautions Following Commands: Follows multi-step commands inconsistently;Follows multi-step commands with increased time Safety/Judgement: Decreased awareness of safety;Decreased awareness of deficits Awareness: Emergent   General Comments: pt able to verbalize location and where he was at the time of event.    General Comments       Exercises     Shoulder Instructions      Home Living Family/patient expects to be discharged to:: Private residence Living Arrangements: Alone Available Help at Discharge: Family(brother, sister, son) Type of Home: Apartment Home Access: Level entry     Home Layout: One level     Bathroom Shower/Tub: Tub/shower unit         Home Equipment: None      Lives With: Alone    Prior Functioning/Environment Level of Independence: Independent        Comments: Drives, takes care of granddaughter occasionally        OT Problem List: Decreased strength;Decreased range of motion;Decreased activity tolerance;Impaired balance (sitting and/or standing);Impaired vision/perception;Decreased cognition;Decreased coordination;Decreased safety awareness;Decreased knowledge of use of DME or AE;Decreased knowledge of precautions;Cardiopulmonary status limiting activity;Impaired UE functional use      OT  Treatment/Interventions: Self-care/ADL training;Therapeutic exercise;Neuromuscular education;Energy conservation;DME and/or AE instruction;Manual therapy;Modalities;Therapeutic activities;Visual/perceptual remediation/compensation;Cognitive remediation/compensation;Patient/family education;Balance training    OT Goals(Current goals can be found in the care plan section) Acute Rehab OT Goals Patient Stated Goal: to walk again OT Goal Formulation: With patient Time For Goal Achievement: 01/15/19 Potential to Achieve Goals: Good  OT Frequency: Min 3X/week   Barriers to D/C:    son and brother are getting patients car from [place] and will be able to provide some (A)       Co-evaluation PT/OT/SLP Co-Evaluation/Treatment: Yes Reason for Co-Treatment: Complexity of the patient's impairments (multi-system involvement);Necessary to address cognition/behavior during functional activity;For patient/therapist safety;To address functional/ADL transfers   OT goals addressed during session: ADL's and self-care;Proper use of Adaptive equipment and DME;Strengthening/ROM      AM-PAC OT "6 Clicks" Daily Activity     Outcome Measure Help from another person eating meals?: A Lot Help from another person taking care of personal grooming?: A Lot Help from another person toileting, which includes using toliet, bedpan, or urinal?: A Lot Help from another person bathing (including washing, rinsing, drying)?: A Lot Help from another person to put on and taking off regular upper body clothing?: A Lot Help from another person to put on and taking off regular lower body clothing?: Total 6 Click Score: 11   End of Session Equipment Utilized During Treatment: Gait belt;Rolling walker Nurse Communication: Mobility status;Precautions  Activity Tolerance: Patient tolerated treatment well Patient left: in chair;with call bell/phone within reach;with chair alarm set  OT Visit Diagnosis: Unsteadiness on feet  (R26.81);Muscle weakness (  generalized) (M62.81)                Time: 0601-5615 OT Time Calculation (min): 31 min Charges:  OT General Charges $OT Visit: 1 Visit OT Evaluation $OT Eval Moderate Complexity: 1 Mod   Jeri Modena, OTR/L  Acute Rehabilitation Services Pager: [phone] Office: [phone] .   Jeri Modena 01/01/2019, 3:32 PM

## 2019-01-01 NOTE — Progress Notes (Signed)
NAME:  Randy [name], MRN:  [id number], DOB:  21-Sep-1945, LOS: 2 ADMISSION DATE:  01/03/2019, CONSULTATION DATE: June 17 REFERRING MD: Dr. Rory Percy, CHIEF COMPLAINT: Intraparenchymal hemorrhage  Brief History   73 year old male anticoagulated with Xarelto presented with intraparenchymal hematoma and was emergently taken to the OR for evacuation.  Past Medical History  Tobacco Abuse  PE  PAF - on Xarelto NSTEMI  HTN  ETOH Abuse   Significant Hospital Events   6/17 Admit with cerebellar ICH. To OR for evacuation. Out to ICU intubated.   Consults:  Neurosurgery PCCM  Procedures:  ETT 6/17 >  Art line 6/17 >  Significant Diagnostic Tests:  CT head 6/17 > Acute intraparenchymal hemorrhage within the left cerebellum with volume of 28 mL and intraventricular extension to the third and fourth ventricles. 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 CTA head 6/17 > Severely motion degraded study. No proximal large vessel occlusion of the anterior circulation. The hemorrhage pattern of the left cerebellum is most consistent with hypertensive hemorrhage. ECHO 6/18 > severe akinesis of the left ventricular, mid-apical apical segment and lateral wall, LVEF ~ 50-55%, No RWMA, normal RV systolic function, moderate MV regurgitation  Micro Data:  SARS-CoV-2 6/17 > neg  Antimicrobials:  Periop cefazolin 6/17  Interim history/subjective:  RN reports pt was started on precedex yesterday afternoon and continued overnight.  Stopped this am.  Pt more sedate.  On 3%NS.    Objective   Blood pressure 118/64, pulse 65, temperature 97.9 F (36.6 C), resp. rate 20, height 5\' 4"  (1.626 m), weight 68.1 kg, SpO2 (P) 95 %.        Intake/Output Summary (Last 24 hours) at 01/01/2019 0915 Last data filed at 01/01/2019 0800 Gross per 24 hour  Intake 2228.47 ml  Output 900 ml  Net 1328.47 ml   Filed Weights   12/28/2018  1352 01/06/2019 2245  Weight: 68 kg 68.1 kg    Examination: General: elderly male lying in bed in NAD HEENT: MM pink/moist, no jvd Neuro: Awakens to voice, opens eyes, follows commands, slight weakness of LE's, RUE CV: s1s2 rrr, no m/r/g PULM: even/non-labored, lungs bilaterally with rhonchi, moist cough NW:GNFA, non-tender, bsx4 active  Extremities: warm/dry, no edema  Skin: no rashes or lesions  Resolved Hospital Problem list     Assessment & Plan:   Cerebellar Intraparenchymal Hemorrhage -s/p evacuation 6/17 P: SBP goal < 160 per NSGY / Neurology  Cleviprex for above  Decadron taper  Post operative care per NSGY  Follow serum Na Q6  At Risk Aspiration   -in setting of IPH P: May need NTS PRN  Upright positioning  Follow CXR intermittently  Avoid sedating medications NPO  Place cortrak  Atrial fibrillation on Xarelto -currently sinus on tele. Coagulopathy reversed with [facility] in ED.  Hypertension P: ICU monitoring  Hold all anticoagulation  Continue losartan, metoprolol   NAG Acidosis P: Follow BMp   Anemia  -mild  P: Follow CBC  Transfuse per ICU guidelines   Hyperglycemia  P: SSI  History of ETOH Abuse  -remote and he has not used for about 10 years: LFT normal on arrival  P: Supportive care   Best practice:  Diet: NPO Pain/Anxiety/Delirium protocol (if indicated): n/a VAP protocol (if indicated): Aspiration precautions  DVT prophylaxis: per primary GI prophylaxis: PPI Glucose control: SSI Mobility: BR Code Status: FULL Family Communication: Per primary  Disposition: ICU  Labs   CBC: Recent Labs  Lab 01/03/2019 1338 01/11/2019 1345 12/31/18 0540 01/01/19 0442  WBC 14.8*  --  12.5* 15.8*  HGB 15.9 17.0 12.9* 12.2*  HCT 48.9 50.0 38.2* 36.6*  MCV 91.9  --  89.9 91.5  PLT 233  --  191 732    Basic Metabolic Panel: Recent Labs  Lab 12/23/2018 1339 12/22/2018 1345 12/31/18 0540 12/31/18 1109 12/31/18 1541 12/31/18 2118 01/01/19  0442  NA 137 137 136 138 140 140 144  K 5.0 4.9 3.8  --   --   --  3.8  CL 106 105 106  --   --   --  116*  CO2 19*  --  20*  --   --   --  19*  GLUCOSE 139* 139* 130*  --   --   --  148*  BUN 18 22 15   --   --   --  25*  CREATININE 1.17 1.00 1.16  --   --   --  1.02  CALCIUM 9.2  --  8.6*  --   --   --  8.4*   GFR: Estimated Creatinine Clearance: 55.6 mL/min (by C-G formula based on SCr of 1.02 mg/dL). Recent Labs  Lab 12/22/2018 1338 12/31/18 0540 01/01/19 0442  WBC 14.8* 12.5* 15.8*    Liver Function Tests: Recent Labs  Lab 01/07/2019 1339  AST 34  ALT 30  ALKPHOS 41  BILITOT 1.2  PROT 6.8  ALBUMIN 4.1   Recent Labs  Lab 12/28/2018 1338  LIPASE 28   No results for input(s): AMMONIA in the last 168 hours.  ABG    Component Value Date/Time   PHART 7.343 (L) 12/22/2018 2317   PCO2ART 42.3 12/29/2018 2317   PO2ART 109 (H) 12/20/2018 2317   HCO3 22.4 01/09/2019 2317   TCO2 25 12/22/2018 1345   ACIDBASEDEF 2.5 (H) 12/17/2018 2317   O2SAT 97.7 12/15/2018 2317     Coagulation Profile: Recent Labs  Lab 12/28/2018 1338 12/31/18 0540  INR 1.6* 1.2    Cardiac Enzymes: Recent Labs  Lab 12/15/2018 1338  TROPONINI 0.05*    HbA1C: Hgb A1c MFr Bld  Date/Time Value Ref Range Status  01/09/2019 06:03 PM 6.1 (H) 4.8 - 5.6 % Final    Comment:    (NOTE) Pre diabetes:          5.7%-6.4% Diabetes:              >6.4% Glycemic control for   <7.0% adults with diabetes   06/29/2018 01:44 PM 5.4 4.8 - 5.6 % Final    Comment:    (NOTE) Pre diabetes:          5.7%-6.4% Diabetes:              >6.4% Glycemic control for   <7.0% adults with diabetes     CBG: Recent Labs  Lab 12/31/18 1530 12/31/18 1922 12/31/18 2319 01/01/19 0317 01/01/19 0753  GLUCAP 167* 140* 152* 133* 142*    Critical care time: 30  minutes     Noe Gens, NP-C [facility] Pgr: [phone] or if no answer [phone] 01/01/2019, 9:15 AM

## 2019-01-01 NOTE — Progress Notes (Signed)
Rehab Admissions Coordinator Note:  Patient was screened by Michel Santee, PT, DPT for appropriateness for an Inpatient Acute Rehab Consult.  At this time, we are recommending Inpatient Rehab consult. Please place IP Rehab MD Consult order.   Shann Medal, PT, DPT Admissions Coordinator [phone] 01/01/19  4:18 PM

## 2019-01-01 NOTE — Progress Notes (Signed)
Transferring patient from chair to bed and PICC line got removed. Pressure held and gauze applied. Hypertonic saline going through PIV at this time. CCM and Stroke MDs aware. Will need a second picc 6/20 if 3% is continued per Neuro MD. Dyane Dustman, Rande Brunt, RN

## 2019-01-01 NOTE — Procedures (Signed)
Cortrak  Person Inserting Tube:  Elin Seats, RD Tube Type:  Cortrak - 43 inches Tube Location:  Left nare Initial Placement:  Stomach Secured by: Bridle Technique Used to Measure Tube Placement:  Documented cm marking at nare/ corner of mouth Cortrak Secured At:  60 cm   No x-ray is required. RN may begin using tube.   If the tube becomes dislodged please keep the tube and contact the Cortrak team at [email or web address] ([email or web address]) for replacement.  If after hours and replacement cannot be delayed, place a NG tube and confirm placement with an abdominal x-ray.   Deya Bigos RD, LDN Clinical Nutrition Pager # - [phone]   

## 2019-01-01 NOTE — Progress Notes (Addendum)
Initial Nutrition Assessment  RD working remotely.  DOCUMENTATION CODES:   Not applicable  INTERVENTION:   Tube feeding via Cortrak: - Vital AF 1.2 @ 35 ml/hr (840 ml/day) - Pro-stat 30 ml BID - Free water per MD  Tube feeding regimen provides 1208 kcal, 93 grams of protein, and 681 ml of H2O.   Tube feeding regimen and current Cleviprex provides 1880 total kcal (100% of needs).  NUTRITION DIAGNOSIS:   Inadequate oral intake related to dysphagia as evidenced by NPO status.  GOAL:   Patient will meet greater than or equal to 90% of their needs  MONITOR:   Diet advancement, Labs, I & O's, Weight trends, TF tolerance  REASON FOR ASSESSMENT:   Consult Enteral/tube feeding initiation and management  ASSESSMENT:   73 year old Randy [name] who presented to the ED on 6/17 with dizziness and diaphoresis. PMH of CAD s/p CABG, afib on Xarelto, HTN, NSTEMI, EtOH abuse. Pt was intubated in the ED after his mental status declined. CT head showing acute intraparenchymal hemorrhage on the left with intraventricular extension. S/p emergent suboccipital craniectomy for evacuation of large cerebellar hemorrhage on 6/17.  6/18 - extubated  SLP recommending pt remain NPO. Plan is for pt to have Cortrak placed today. RD paged CCM who approved RD starting TF. Verbal with readback order placed.  Reviewed weight history in chart. Weight fairly stable over the last 6 months.  Medications reviewed and include: SSI, Protonix, Senna, Cleviprex @ 14 ml/hr (provides 672 kcal daily from lipid), 3% saline @ 50 ml/hr  Labs reviewed: sodium 137, chloride 116, BUN 25 CBG's: 133-167 x 24 hours  UOP: 900 ml x 24 hours I/O's: +2.0 L since admit  NUTRITION - FOCUSED PHYSICAL EXAM:  Unable to complete at this time. RD working remotely.  Diet Order:   Diet Order            Diet NPO time specified  Diet effective now              EDUCATION NEEDS:   No education needs have been identified at this  time  Skin:  Skin Assessment: Skin Integrity Issues: Incisions: closed incision to head  Last BM:  no documented BM  Height:   Ht Readings from Last 1 Encounters:  12/16/2018 5\' 4"  (1.626 m)    Weight:   Wt Readings from Last 1 Encounters:  12/18/2018 68.1 kg    Ideal Body Weight:  59.1 kg  BMI:  Body mass index is 25.77 kg/m.  Estimated Nutritional Needs:   Kcal:  1700-1900  Protein:  80-95 grams  Fluid:  >/= 1.7 L    Gaynell Face, MS, RD, LDN Inpatient Clinical Dietitian Pager: [phone] Weekend/After Hours: [phone]

## 2019-01-01 NOTE — Progress Notes (Signed)
STROKE TEAM PROGRESS NOTE   INTERVAL HISTORY Pt mildly sleepy but easily arousable. He was extubated yesterday, tolerating well so far. Has some secretion in the throat. Neuro wise fully orientated and following commands, still has right gaze preference and left gaze palsy. On 3% and repeat CT no hydro but some right hemisphere hygroma.   Vitals:   01/01/19 0700 01/01/19 0800 01/01/19 0900 01/01/19 1000  BP: (!) 122/58 118/64 120/69 129/70  Pulse: (!) 51 65 61 73  Resp: 20 20 19  (!) 24  Temp: 98.2 F (36.8 C) 97.9 F (36.6 C) 98.2 F (36.8 C)   TempSrc:      SpO2:   94% 97%  Weight:      Height:        CBC:  Recent Labs  Lab 12/31/18 0540 01/01/19 0442  WBC 12.5* 15.8*  HGB 12.9* 12.2*  HCT 38.2* 36.6*  MCV 89.9 91.5  PLT 191 010    Basic Metabolic Panel:  Recent Labs  Lab 12/31/18 0540  01/01/19 0442 01/01/19 0924  NA 136   < > 144 147*  K 3.8  --  3.8  --   CL 106  --  116*  --   CO2 20*  --  19*  --   GLUCOSE 130*  --  148*  --   BUN 15  --  25*  --   CREATININE 1.16  --  1.02  --   CALCIUM 8.6*  --  8.4*  --    < > = values in this interval not displayed.   Lipid Panel:     Component Value Date/Time   CHOL 126 01/04/2019 1803   CHOL 123 08/18/2018 0947   TRIG 100 12/14/2018 1803   HDL 58 01/06/2019 1803   HDL 68 08/18/2018 0947   CHOLHDL 2.2 01/04/2019 1803   VLDL 20 01/08/2019 1803   LDLCALC 48 12/19/2018 1803   LDLCALC 43 08/18/2018 0947   HgbA1c:  Lab Results  Component Value Date   HGBA1C 6.1 (H) 01/01/2019   Urine Drug Screen:     Component Value Date/Time   LABOPIA NONE DETECTED 01/12/2019 2301   COCAINSCRNUR NONE DETECTED 01/09/2019 2301   LABBENZ NONE DETECTED 12/28/2018 2301   AMPHETMU NONE DETECTED 01/11/2019 2301   THCU NONE DETECTED 01/02/2019 2301   LABBARB NONE DETECTED 01/01/2019 2301    Alcohol Level     Component Value Date/Time   ETH <10 06/29/2018 1035    IMAGING Ct Angio Head W Or Wo Contrast  Result Date:  01/03/2019 CLINICAL DATA:  Intracranial hemorrhage EXAM: CT ANGIOGRAPHY HEAD TECHNIQUE: Multidetector CT imaging of the head was performed using the standard protocol during bolus administration of intravenous contrast. Multiplanar CT image reconstructions and MIPs were obtained to evaluate the vascular anatomy. CONTRAST:  110mL OMNIPAQUE IOHEXOL 350 MG/ML SOLN COMPARISON:  Head CT 01/11/2019 FINDINGS: The examination is severely degraded by motion. CTA HEAD FINDINGS POSTERIOR CIRCULATION: Poor visualization of the posterior fossa due to motion. Distal basilar artery and the posterior cerebral arteries are patent. ANTERIOR CIRCULATION: --Intracranial internal carotid arteries: There is atherosclerotic calcification of both internal carotid arteries. Both are patent, but assessment is otherwise limited by motion. --Anterior cerebral arteries (ACA): No proximal occlusion --Middle cerebral arteries (MCA): No proximal occlusion IMPRESSION: 1. Severely motion degraded study. No proximal large vessel occlusion of the anterior circulation. 2. The hemorrhage pattern of the left cerebellum is most consistent with hypertensive hemorrhage. Electronically Signed   By: Lennette Bihari  [place] M.D.   On: 12/29/2018 17:10   Ct Head Wo Contrast  Result Date: 01/01/2019 CLINICAL DATA:  Follow-up known intracranial hemorrhage EXAM: CT HEAD WITHOUT CONTRAST TECHNIQUE: Contiguous axial images were obtained from the base of the skull through the vertex without intravenous contrast. COMPARISON:  Yesterday and day before FINDINGS: Brain: Motion degraded but adequate for hemorrhage follow-up. The left cerebellar hematoma evacuation site shows no interval hemorrhage. Gas/hemostatic material in this location is being replaced by edematous parenchyma. Intraventricular hemorrhage at the level of the fourth ventricle, third ventricle, and occipital horns is unchanged. Lateral ventricular volume has diminished and there is no longer any temporal  ballooning. Possible small hygroma along the right cerebral hemisphere, without associated mass effect. No evidence of interval infarct. Vascular: Atherosclerotic calcification showing vertebrobasilar tortuosity. Skull: Unremarkable left suboccipital craniectomy. Sinuses/Orbits: Negative IMPRESSION: 1. Stable volume of intracranial hemorrhage that is mainly intraventricular. 2. Interval normalization of lateral ventricular volume. Electronically Signed   By: Monte Fantasia M.D.   On: 01/01/2019 06:35   Ct Head Wo Contrast  Result Date: 12/31/2018 CLINICAL DATA:  Intracranial hemorrhage. EXAM: CT HEAD WITHOUT CONTRAST TECHNIQUE: Contiguous axial images were obtained from the base of the skull through the vertex without intravenous contrast. COMPARISON:  CT head without contrast 12/29/2018 FINDINGS: Brain: The patient is status post suboccipital craniotomy. The left cerebellar hemorrhage was evacuated. Fourth ventricle is decompressed. There is some residual blood products in the surgical cavity. Intraventricular hemorrhage is again noted within the fourth ventricle and aqueduct of Sylvius to the third ventricle. There is increased blood layering in the lateral ventricles bilaterally. There is no hydrocephalus. Minimal pneumocephalus is now present. Remote periventricular white matter changes are stable. No acute supratentorial cortical abnormality is present. Vascular: Atherosclerotic calcifications are present within the cavernous internal carotid arteries bilaterally. Left vertebral artery calcifications are present. There is no hyperdense vessel. Skull: Suboccipital craniotomy is noted. Calvarium is otherwise intact. No focal lytic or blastic lesions are present. Sinuses/Orbits: The paranasal sinuses and mastoid air cells are clear. The globes and orbits are within normal limits. IMPRESSION: 1. Left suboccipital craniotomy for evacuation of left cerebellar hemorrhage. 2. Decreased mass effect on the fourth  ventricle and in the posterior fossa. 3. Intraventricular hemorrhage now evident in the fourth ventricle, aqueduct of Sylvius, third ventricle, and layering in the lateral ventricles bilaterally. 4. Minimal pneumocephalus as expected. 5. Atherosclerosis Electronically Signed   By: San Morelle M.D.   On: 12/31/2018 07:31   Ct Head Wo Contrast  Result Date: 12/24/2018 CLINICAL DATA:  ALTERED MENTAL STATUS AND HYPERTENSION EXAM: CT HEAD WITHOUT CONTRAST TECHNIQUE: Contiguous axial images were obtained from the base of the skull through the vertex without intravenous contrast. COMPARISON:  None. FINDINGS: Brain: There is a large intraparenchymal hematoma in the left cerebellum in the region of the dentate nucleus, measuring 3.7 x 4.4 x 3.3 cm (volume = 28 cm^3). There is intraventricular extension into the fourth ventricle and third ventricle. There is marked mass effect within the posterior fossa. No hydrocephalus. There is an old right basal ganglia small vessel infarct. Vascular: Atherosclerotic calcification of the vertebral and internal carotid arteries at the skull base. No abnormal hyperdensity of the major intracranial arteries or dural venous sinuses. Skull: The visualized skull base, calvarium and extracranial soft tissues are normal. Sinuses/Orbits: No fluid levels or advanced mucosal thickening of the visualized paranasal sinuses. No mastoid or middle ear effusion. The orbits are normal. IMPRESSION: Acute intraparenchymal hemorrhage within the left cerebellum with  volume of 28 mL and intraventricular extension to the third and fourth ventricles. 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 Critical Value/emergent results were called by telephone at the time of interpretation on 01/09/2019 at 4:14 pm to Dr. Shirlyn Goltz , who verbally acknowledged these results. Electronically Signed   By: Ulyses Jarred M.D.    On: 01/11/2019 16:15   Dg Chest Portable 1 View  Result Date: 01/12/2019 CLINICAL DATA:  Chest pain EXAM: PORTABLE CHEST 1 VIEW COMPARISON:  07/30/2018 FINDINGS: Heart size is slightly enlarged. The patient is status post prior median sternotomy. The endotracheal tube terminates approximately 5 cm above the carina. The enteric tube extends below the left hemidiaphragm. The side hole projects over the GE junction. There is no definite pneumothorax, however the left lung field is partially obscured by overlapping defibrillator pad. There is no acute osseous abnormality. There is a minimal amount of atelectasis at the lung bases. No definite pneumothorax. No large pleural effusion. Aortic calcifications are noted. IMPRESSION: 1. Lines and tubes as above. 2. Borderline cardiomegaly. The patient is status post prior median sternotomy. 3. The lungs are clear without evidence of a significant pneumothorax or pleural effusion. Electronically Signed   By: Constance Holster M.D.   On: 01/12/2019 18:22   Dg Abd Portable 1v  Result Date: 12/21/2018 CLINICAL DATA:  OG tube placement EXAM: PORTABLE ABDOMEN - 1 VIEW COMPARISON:  None. FINDINGS: Esophageal tube tip overlies the proximal stomach, side-port in the region of GE junction. Pleural and parenchymal disease at the left base. Mild gas distention of colon with moderate stool. IMPRESSION: Esophageal tube side-port overlies the GE junction, consider further advancement for more optimal positioning Electronically Signed   By: Donavan Foil M.D.   On: 12/22/2018 21:33   [place] Ekg Site Rite  Result Date: 12/31/2018 If Site Rite image not attached, placement could not be confirmed due to current cardiac rhythm.   PHYSICAL EXAM  Temp:  [97.5 F (36.4 C)-99.5 F (37.5 C)] 98.2 F (36.8 C) (06/19 0900) Pulse Rate:  [40-86] 73 (06/19 1000) Resp:  [14-26] 24 (06/19 1000) BP: (101-162)/(53-100) 129/70 (06/19 1000) SpO2:  [94 %-97 %] 97 % (06/19 1000) Arterial Line  BP: (110-154)/(42-73) 132/60 (06/19 0900)  General - Well nourished, well developed, mildly sleepy  Ophthalmologic - fundi not visualized due to noncooperation.  Cardiovascular - Regular rate and rhythm.  Neuro - mildly sleepy but easily arousable, able to following simple commands, speech fluent, able to name and repeat. Eyes in right gaze preference position, left gaze palsy, no nystagmus, visual field full, PERRL.  Facial symmetric.  Tongue midline on protrusion. Moving all extremities equally on commands, at least 4/5. DTR 1+ and no babinski. Sensation symmetrical as per pt, coordination intact on the right FTN although slow but ataxia on the left FTN and gait not tested.   ASSESSMENT/PLAN [name] is a 73 y.o. male with history of tobacco abuse, and pulmonary embolus, paroxysmal atrial fibrillation, hypertension, alcohol abuse with history of coagulopathy in setting of Xarelto from prior alcoholism presenting with dizziness, diaphoresis and bradycardia.  Initially felt to be cardiac but cerebellar bleed found on imaging.   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 Ronnald Ramp, post suboccipital Craniectomy 6/17  CT head 6/17 left cerebellar hemorrhage 28 mils with IVH.  Posterior fossa without hydrocephalus  CTA head motion degraded.  No gross AVM or aneurysm  CT head 6/18 L suboccipital craniotomy, evacuation L cerebellar hemorrhage.  Decreased mass-effect on fourth ventricle and posterior fossa.  IVH bilaterally.  Minimal pneumocephalus.  Atherosclerosis.  CT repeat stable hematoma s/p evacuation and IVH, no hydrocephalus but right hemisphere developing hygroma  MRI to rule out hemorrhagic infarct once able to lay flat longer   2D Echo EF 50 to 55%  LDL 48  HgbA1c 6.1  SCDs for VTE prophylaxis  aspirin 81 mg daily and Xarelto (rivaroxaban) daily prior to admission, now on  No antithrombotic given hemorrhage.   Therapy recommendations:  pending   Disposition:  pending   Cerebral Edema Induced Hypernatremia  On 3% protocol  Also on dexamethasone per neurosurgery  Na 136->138->144->147  Goal Na 150-155  Check Na q 6h  Keep foley  CT repeat stable hematoma s/p evacuation and IVH, no hydrocephalus but right hemisphere developing hygroma  Hydrocephalus and right hemisphere hygroma   Repeat CT 12/31/2018 showed mild developing hydrocephalus  At this time no indication for EVD  CT repeat stable hematoma s/p evacuation and IVH, no hydrocephalus but right hemisphere developing hygroma  Hygroma on the right will need close monitoring  Acute Hypoxic Respiratory failure   Secondary to stroke   Intubated in ED   Extubated 12/31/18  Tolerating well  Continue to have copious secretions  On 3% neb Q6h while awake  NT suction  Post CABG Atrial Fibrillation  CABG 06/2018  Home anticoagulation:  Xarelto (rivaroxaban) daily   Reversed with [facility]  Hypertensive emergency  Home meds: Cozaar 100, Toprol 50  Systolic blood pressure greater than 200s with hemorrhage   Treated with Cleviprex   Currently stable  SBP goal <160   Restlessness  Developed restlessness after extubation  Put on Precedex, now off  Close monitoring  Hyperlipidemia  Home meds: Lipitor 80  LDL 48, at goal < 70  Hold statin given hemorrhage  Consider continuation of statin lower dose at discharge  Dysphagia   Secondary to stroke   Currently n.p.o.   Speech following  Plan for cortrak placement today  Will start TF and BP meds after cortrak  Leukocytosis   WBC 14.8->12.5->15.8.   TMax 100.2->afebrile  UA pending  CXR pending  Other Stroke Risk Factors  Advanced age  Former smoker, quit smoking 6 months ago  History of alcohol abuse, last drink 10 years ago  Coronary artery disease, NSTEMI s/p CABG, December 2019  Other Active  Problems  Mildly elevated troponin 0.05, reactionary  Hospital day # 2  This patient is critically ill due to cerebellar hemorrhage, status post surgical evacuation, cerebral edema, hypertensive emergency, A. fib on anticoagulation and at significant risk of neurological worsening, death form hematoma expansion, brain herniation, heart failure, seizure. This patient's care requires constant monitoring of vital signs, hemodynamics, respiratory and cardiac monitoring, review of multiple databases, neurological assessment, discussion with family, other specialists and medical decision making of high complexity. I spent 40 minutes of neurocritical care time in the care of this patient. I had long discussion with son Rush Landmark over the phone, updated pt current condition, treatment plan and potential prognosis. He expressed understanding and appreciation.   Rosalin Hawking, MD PhD Stroke Neurology 01/01/2019 10:21 AM     To contact Stroke Continuity provider, please refer to [email or web address] After hours, contact General Neurology

## 2019-01-01 NOTE — Evaluation (Addendum)
Speech Language Pathology Evaluation Patient Details Name: Randy [name] MRN: [id number] DOB: 1945-10-10 Today's Date: 01/01/2019 Time: 1000-1015 SLP Time Calculation (min) (ACUTE ONLY): 15 min  Problem List:  Patient Active Problem List   Diagnosis Date Noted  . Hypertensive urgency   . Endotracheal tube present   . Normal anion gap metabolic acidosis   . Intracerebral hemorrhage 12/24/2018  . Cerebellar hemorrhage ([place]) 12/31/2018  . CAD (coronary artery disease) 10/16/2018  . Hyperlipidemia LDL goal <70 07/20/2018  . Postoperative atrial fibrillation ([place]) 07/20/2018  . Essential hypertension 07/20/2018  . Cellulitis of leg, right 07/14/2018  . Cellulitis of right leg 07/14/2018  . S/P CABG x 4 07/01/2018   Past Medical History:  Past Medical History:  Diagnosis Date  . Alcohol abuse    Last drink was 10 years ago (2009).  Marland Kitchen Hypertension   . NSTEMI (non-ST elevated myocardial infarction) ([place]) 06/29/2018   s/p CABG w/ LIMA-LAD, SVG-diagonal, SVG-OM, SVG-PLV  . PAF (paroxysmal atrial fibrillation) ([place]) 06/2018   Postop CABG  . Pulmonary embolus ([place]) 06/29/2018  . Tobacco abuse    Past Surgical History:  Past Surgical History:  Procedure Laterality Date  . CORONARY ARTERY BYPASS GRAFT N/A 07/01/2018   Procedure: CORONARY ARTERY BYPASS GRAFTING (CABG) x 4, LIMA TO LAD, SVG TO OM1, SVG TO DIAG, SVG TO PL, USING LEFT INTERNAL MAMMARY ARTERY AND RIGHT GREATER SAPHENOUS VEIN HARVESTED ENDOSCOPICALLY;  Surgeon: Grace Isaac, MD;  Location: [facility];  Service: Open Heart Surgery;  Laterality: N/A;  . IABP INSERTION Right 06/29/2018   Procedure: IABP Insertion;  Surgeon: Sherren Mocha, MD;  Location: [place];  Service: Cardiovascular;  Laterality: Right;  . LEFT HEART CATH AND CORONARY ANGIOGRAPHY N/A 06/29/2018   Procedure: LEFT HEART CATH AND CORONARY ANGIOGRAPHY;  Surgeon: Sherren Mocha, MD;  Location: [place] [facility];  Service: Cardiovascular;  Laterality:  N/A;  . SUBOCCIPITAL CRANIECTOMY CERVICAL LAMINECTOMY N/A 01/08/2019   Procedure: SUBOCCIPITAL CRANIECTOMY FOR CEREBELLAR HEMATOMA;  Surgeon: Eustace Moore, MD;  Location: [facility];  Service: Neurosurgery;  Laterality: N/A;  . TEE WITHOUT CARDIOVERSION N/A 07/01/2018   Procedure: TRANSESOPHAGEAL ECHOCARDIOGRAM (TEE);  Surgeon: Grace Isaac, MD;  Location: [facility];  Service: Open Heart Surgery;  Laterality: N/A;   HPI:  Patient is a 73 y.o. male admitted after almost passing out. CT revealed large left cerebellar hemorrhage. MRI showed large left cerebellar hemorrhage with intraventricular extension without hydrocephalus. Intubated 6/17-6/18.  Underwent craniotomy of occipital region. CXR The lungs are clear without evidence of a significant pneumothorax or pleural effusion. PMH: tobacco abuse, PE, NSTEMI, ETOH abuse, HTN.   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 He was not oriented to current situation or month/date and recognizes his moderate dysarthria but did not report visual, cognitive or physical impairments. Discussed/educated pt re: site of hemorrhage in regards to function. He will benefit from continued therapy targeting memory, problem solving, attention (right inattention), awareness and dysarthria on acute venue and inpatient rehab.         SLP Assessment  SLP Recommendation/Assessment: Patient needs continued Speech Lanaguage Pathology Services SLP Visit Diagnosis: Dysarthria and anarthria (R47.1);Cognitive communication deficit (R41.841)    Follow Up Recommendations  Inpatient Rehab    Frequency and Duration min 2x/week  2 weeks      SLP Evaluation Cognition  Overall Cognitive Status: Impaired/Different from baseline Arousal/Alertness: Awake/alert Orientation Level: Oriented to person;Disoriented to  time;Disoriented to situation;Oriented to place Attention:  Sustained Sustained Attention: Impaired Sustained Attention Impairment: Verbal basic;Functional basic(right inattention from visual aspect) Memory: Impaired Memory Impairment: Decreased short term memory;Decreased recall of new information Awareness: Impaired Awareness Impairment: Intellectual impairment;Emergent impairment Problem Solving: Impaired Problem Solving Impairment: Verbal basic Safety/Judgment: Impaired       Comprehension  Auditory Comprehension Overall Auditory Comprehension: Appears within functional limits for tasks assessed Yes/No Questions: Within Functional Limits Commands: (one and two step) Interfering Components: Attention;Processing speed;Visual impairments;Working Curator: Not tested Reading Comprehension Reading Status: (TBA)    Expression Expression Primary Mode of Expression: Verbal Verbal Expression Overall Verbal Expression: Other (comment)(occassional dysnomia) Initiation: No impairment Level of Generative/Spontaneous Verbalization: Conversation Repetition: (NT) Naming: No impairment Pragmatics: Impairment Impairments: Eye contact Interfering Components: Attention Written Expression Dominant Hand: Right Written Expression: (TBA)   Oral / Motor  Oral Motor/Sensory Function Overall Oral Motor/Sensory Function: Mild impairment Facial ROM: Reduced right;Suspected CN VII (facial) dysfunction Facial Symmetry: Abnormal symmetry right;Suspected CN VII (facial) dysfunction Facial Strength: Reduced right Lingual ROM: Within Functional Limits Lingual Symmetry: Within Functional Limits Mandible: Within Functional Limits Motor Speech Overall Motor Speech: Impaired Respiration: Within functional limits Phonation: (quality is deep/spastic?) Resonance: (slight hypernasality?) Articulation: Within functional limitis Intelligibility: Intelligibility reduced Word: 75-100% accurate Phrase: 75-100%  accurate Sentence: 75-100% accurate Conversation: 50-74% accurate Motor Planning: Witnin functional limits   GO                    Houston Siren 01/01/2019, 10:57 AM   Orbie Pyo Colvin Caroli.Ed Risk analyst [phone] Office [phone]

## 2019-01-02 ENCOUNTER — Encounter (HOSPITAL_COMMUNITY): Payer: Self-pay | Admitting: *Deleted

## 2019-01-02 ENCOUNTER — Inpatient Hospital Stay (HOSPITAL_COMMUNITY): Payer: Medicare Other

## 2019-01-02 ENCOUNTER — Inpatient Hospital Stay: Payer: Self-pay

## 2019-01-02 LAB — BASIC METABOLIC PANEL
Anion gap: 9 (ref 5–15)
BUN: 28 mg/dL — ABNORMAL HIGH (ref 8–23)
CO2: 20 mmol/L — ABNORMAL LOW (ref 22–32)
Calcium: 9.2 mg/dL (ref 8.9–10.3)
Chloride: 125 mmol/L — ABNORMAL HIGH (ref 98–111)
Creatinine, Ser: 0.92 mg/dL (ref 0.61–1.24)
GFR calc Af Amer: >60 mL/min (ref >60)
GFR calc non Af Amer: >60 mL/min (ref >60)
Glucose, Bld: 167 mg/dL — ABNORMAL HIGH (ref 70–99)
Potassium: 3.6 mmol/L (ref 3.5–5.1)
Sodium: 154 mmol/L — ABNORMAL HIGH (ref 135–145)

## 2019-01-02 LAB — GLUCOSE, CAPILLARY
Glucose-Capillary: 129 mg/dL — ABNORMAL HIGH (ref 70–99)
Glucose-Capillary: 136 mg/dL — ABNORMAL HIGH (ref 70–99)
Glucose-Capillary: 146 mg/dL — ABNORMAL HIGH (ref 70–99)
Glucose-Capillary: 150 mg/dL — ABNORMAL HIGH (ref 70–99)
Glucose-Capillary: 154 mg/dL — ABNORMAL HIGH (ref 70–99)
Glucose-Capillary: 164 mg/dL — ABNORMAL HIGH (ref 70–99)

## 2019-01-02 LAB — CBC
HCT: 37.1 % — ABNORMAL LOW (ref 39.0–52.0)
Hemoglobin: 12.2 g/dL — ABNORMAL LOW (ref 13.0–17.0)
MCH: 30.1 pg (ref 26.0–34.0)
MCHC: 32.9 g/dL (ref 30.0–36.0)
MCV: 91.6 fL (ref 80.0–100.0)
Platelets: 157 10*3/uL (ref 150–400)
RBC: 4.05 MIL/uL — ABNORMAL LOW (ref 4.22–5.81)
RDW: 15.8 % — ABNORMAL HIGH (ref 11.5–15.5)
WBC: 16.2 10*3/uL — ABNORMAL HIGH (ref 4.0–10.5)
nRBC: 0 % (ref 0.0–0.2)

## 2019-01-02 LAB — SODIUM
Sodium: 153 mmol/L — ABNORMAL HIGH (ref 135–145)
Sodium: 156 mmol/L — ABNORMAL HIGH (ref 135–145)
Sodium: 156 mmol/L — ABNORMAL HIGH (ref 135–145)
Sodium: 155 mmol/L — ABNORMAL HIGH (ref 135–145)

## 2019-01-02 MED ORDER — METOPROLOL TARTRATE 25 MG PO TABS
25.0000 mg | ORAL_TABLET | Freq: Two times a day (BID) | ORAL | Status: DC
  Administered 2019-01-02 – 2019-01-03 (×3): 25 mg
  Filled 2019-01-02 (×3): qty 1

## 2019-01-02 MED ORDER — METOPROLOL SUCCINATE ER 50 MG PO TB24
50.0000 mg | ORAL_TABLET | Freq: Every day | ORAL | Status: DC
  Filled 2019-01-02: qty 1

## 2019-01-02 MED ORDER — RESOURCE THICKENUP CLEAR PO POWD
ORAL | Status: DC | PRN
  Filled 2019-01-02: qty 125

## 2019-01-02 MED ORDER — CHLORHEXIDINE GLUCONATE 0.12 % MT SOLN
15.0000 mL | Freq: Two times a day (BID) | OROMUCOSAL | Status: DC
  Administered 2019-01-02 (×2): 15 mL via OROMUCOSAL
  Filled 2019-01-02: qty 15

## 2019-01-02 MED ORDER — ENOXAPARIN SODIUM 40 MG/0.4ML ~~LOC~~ SOLN: 40.0000 mg | SUBCUTANEOUS | Status: DC

## 2019-01-02 MED ORDER — HYDRALAZINE HCL 20 MG/ML IJ SOLN
10.0000 mg | Freq: Three times a day (TID) | INTRAMUSCULAR | Status: DC | PRN
  Administered 2019-01-02 – 2019-01-04 (×4): 10 mg via INTRAVENOUS
  Filled 2019-01-02 (×5): qty 1

## 2019-01-02 MED ORDER — DEXAMETHASONE 4 MG PO TABS
4.0000 mg | ORAL_TABLET | Freq: Three times a day (TID) | ORAL | Status: DC
  Administered 2019-01-02 – 2019-01-07 (×14): 4 mg
  Filled 2019-01-02 (×15): qty 1

## 2019-01-02 MED ORDER — POTASSIUM CHLORIDE 20 MEQ/15ML (10%) PO SOLN
20.0000 meq | ORAL | Status: AC
  Administered 2019-01-02 (×2): 20 meq
  Filled 2019-01-02 (×2): qty 15

## 2019-01-02 MED ORDER — METOPROLOL TARTRATE 25 MG/10 ML ORAL SUSPENSION: 25.0000 mg | Freq: Two times a day (BID) | ORAL | Status: DC

## 2019-01-02 MED ORDER — LOSARTAN POTASSIUM 50 MG PO TABS
100.0000 mg | ORAL_TABLET | Freq: Every day | ORAL | Status: DC
  Administered 2019-01-02: 100 mg via ORAL
  Filled 2019-01-02 (×2): qty 2

## 2019-01-02 MED ORDER — ORAL CARE MOUTH RINSE
15.0000 mL | Freq: Two times a day (BID) | OROMUCOSAL | Status: DC
  Administered 2019-01-02 – 2019-01-03 (×4): 15 mL via OROMUCOSAL

## 2019-01-02 MED ORDER — LABETALOL HCL 5 MG/ML IV SOLN
10.0000 mg | INTRAVENOUS | Status: DC | PRN
  Administered 2019-01-02: 10 mg via INTRAVENOUS

## 2019-01-02 NOTE — Progress Notes (Signed)
Spoke again with Mickel Baas, RN about PICC for 3% saline. Hypertonic saline discontinued at this time. If 3% to be restarted, it will be infused via a new PIV. PICC not able to be placed today. Plan to place PICC early tomorrow morning in anticipation of restarting 3% Saline.

## 2019-01-02 NOTE — Progress Notes (Signed)
NAME:  Randy [name], MRN:  [id number], DOB:  06-11-1946, LOS: 3 ADMISSION DATE:  01/08/2019, CONSULTATION DATE: June 17 REFERRING MD: Dr. Rory Percy, CHIEF COMPLAINT: Intraparenchymal hemorrhage  Brief History   73 year old male anticoagulated with Xarelto presented with intraparenchymal hematoma and was emergently taken to the OR for evacuation.  Past Medical History  Tobacco Abuse  PE  PAF - on Xarelto NSTEMI  HTN  ETOH Abuse   Significant Hospital Events   6/17 Admit with cerebellar ICH. To OR for evacuation. Out to ICU intubated.   Consults:  Neurosurgery PCCM  Procedures:  ETT 6/17 > 6/18 Art line 6/17 > 6/18  Significant Diagnostic Tests:  CT head 6/17 > Acute intraparenchymal hemorrhage within the left cerebellum with volume of 28 mL and intraventricular extension to the third and fourth ventricles. 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 CTA head 6/17 > Severely motion degraded study. No proximal large vessel occlusion of the anterior circulation. The hemorrhage pattern of the left cerebellum is most consistent with hypertensive hemorrhage. ECHO 6/18 > severe akinesis of the left ventricular, mid-apical apical segment and lateral wall, LVEF ~ 50-55%, No RWMA, normal RV systolic function, moderate MV regurgitation  Micro Data:  SARS-CoV-2 6/17 > neg  Antimicrobials:  Periop cefazolin 6/17  Interim history/subjective:  A bit more comfortable respiratory pattern today, stronger voice Denies any dyspnea.  Notes that he can partially clear secretions but not completely Remains on Cleviprex, 3% saline PICC accidentally removed 6/19  Objective   Blood pressure (!) 150/79, pulse 86, temperature 97.6 F (36.4 C), temperature source Oral, resp. rate (!) 29, height 5\' 4"  (1.626 m), weight 68.1 kg, SpO2 92 %.        Intake/Output Summary (Last 24 hours) at 01/02/2019 1030 Last data  filed at 01/02/2019 0800 Gross per 24 hour  Intake 1915.55 ml  Output 2275 ml  Net -359.45 ml   Filed Weights   01/05/2019 1352 01/02/2019 2245  Weight: 68 kg 68.1 kg    Examination: General: No distress, elderly man, able speak full sentences HEENT: Oropharynx clear, he does have some raspy secretions in his upper airway with respiration that he can partially clear Neuro: Wide-awake, eyes open, tracks, follows commands, interacts appropriately, slight right upper extremity weakness CV: Regular, no murmur PULM: Using some abdominal muscles, accessory muscles.  Clear bilaterally GI: Soft, nondistended, positive bowel sounds Extremities: No edema Skin: No rash  Resolved Hospital Problem list     Assessment & Plan:   Cerebellar Intraparenchymal Hemorrhage -s/p evacuation 6/17 P: Appreciate neurosurgery management 3% saline as per neurology recommendations, following sodium every 6 hours.  He will need new PICC placement, I requested SBP goal < 160, on Cleviprex.  Losartan and metoprolol restarted 6/20 Tapering dexamethasone  At Risk Aspiration and/or recurrent respiratory failure -in setting of IPH P: Continue NTS and suctioning as needed Push pulmonary hygiene, upright position At some risk for requiring reintubation.  Follow work of breathing, upper airway protection Follow intermittent chest x-ray Avoid sedating medications if possible N.p.o., G-tube in place.  Swallow evaluation to determine when it safe for him to take p.o.  Atrial fibrillation on Xarelto -currently sinus on tele. Coagulopathy reversed with [facility] in ED.  Hypertension P: All anticoagulation on hold Losartan, metoprolol  NAG Acidosis P: Overall stable.  Hold off on bicarbonate infusion at this time Follow BMP, urine output  Anemia  -mild  P: Follow CBC Transfuse per ICU guidelines  Hyperglycemia  P: Sliding scale insulin as ordered  History of ETOH Abuse  -remote and he has not used for  about 10 years: LFT normal on arrival  P:  No intervention necessary at this time   Best practice:  Diet: NPO Pain/Anxiety/Delirium protocol (if indicated): n/a VAP protocol (if indicated): Aspiration precautions  DVT prophylaxis: Enoxaparin to start 6/21 GI prophylaxis: PPI Glucose control: SSI Mobility: BR Code Status: FULL Family Communication: Disposition: ICU  Labs   CBC: Recent Labs  Lab 12/27/2018 1338 01/06/2019 1345 12/31/18 0540 01/01/19 0442 01/02/19 0352  WBC 14.8*  --  12.5* 15.8* 16.2*  HGB 15.9 17.0 12.9* 12.2* 12.2*  HCT 48.9 50.0 38.2* 36.6* 37.1*  MCV 91.9  --  89.9 91.5 91.6  PLT 233  --  191 167 413    Basic Metabolic Panel: Recent Labs  Lab 01/08/2019 1339 01/06/2019 1345 12/31/18 0540  01/01/19 0442 01/01/19 0924 01/01/19 1551 01/02/19 0042 01/02/19 0352  NA 137 137 136   < > 144 147* 148* 153* 154*  K 5.0 4.9 3.8  --  3.8  --   --   --  3.6  CL 106 105 106  --  116*  --   --   --  125*  CO2 19*  --  20*  --  19*  --   --   --  20*  GLUCOSE 139* 139* 130*  --  148*  --   --   --  167*  BUN 18 22 15   --  25*  --   --   --  28*  CREATININE 1.17 1.00 1.16  --  1.02  --   --   --  0.92  CALCIUM 9.2  --  8.6*  --  8.4*  --   --   --  9.2   < > = values in this interval not displayed.   GFR: Estimated Creatinine Clearance: 61.7 mL/min (by C-G formula based on SCr of 0.92 mg/dL). Recent Labs  Lab 01/08/2019 1338 12/31/18 0540 01/01/19 0442 01/02/19 0352  WBC 14.8* 12.5* 15.8* 16.2*    Liver Function Tests: Recent Labs  Lab 12/25/2018 1339  AST 34  ALT 30  ALKPHOS 41  BILITOT 1.2  PROT 6.8  ALBUMIN 4.1   Recent Labs  Lab 12/22/2018 1338  LIPASE 28   No results for input(s): AMMONIA in the last 168 hours.  ABG    Component Value Date/Time   PHART 7.343 (L) 12/17/2018 2317   PCO2ART 42.3 01/03/2019 2317   PO2ART 109 (H) 12/17/2018 2317   HCO3 22.4 01/12/2019 2317   TCO2 25 12/25/2018 1345   ACIDBASEDEF 2.5 (H) 01/09/2019 2317    O2SAT 97.7 12/14/2018 2317     Coagulation Profile: Recent Labs  Lab 01/09/2019 1338 12/31/18 0540  INR 1.6* 1.2    Cardiac Enzymes: Recent Labs  Lab 12/28/2018 1338  TROPONINI 0.05*    HbA1C: Hgb A1c MFr Bld  Date/Time Value Ref Range Status  01/11/2019 06:03 PM 6.1 (H) 4.8 - 5.6 % Final    Comment:    (NOTE) Pre diabetes:          5.7%-6.4% Diabetes:              >6.4% Glycemic control for   <7.0% adults with diabetes   06/29/2018 01:44 PM 5.4 4.8 - 5.6 % Final    Comment:    (NOTE) Pre diabetes:  5.7%-6.4% Diabetes:              >6.4% Glycemic control for   <7.0% adults with diabetes     CBG: Recent Labs  Lab 01/01/19 1544 01/01/19 1935 01/01/19 2313 01/02/19 0343 01/02/19 0750  GLUCAP 112* 136* 161* 150* 154*    Critical care time: 31  minutes     Baltazar Apo, MD, PhD 01/02/2019, 10:38 AM [facility] [phone] or if no answer [phone]

## 2019-01-02 NOTE — Progress Notes (Signed)
[facility] ADULT ICU REPLACEMENT PROTOCOL FOR AM LAB REPLACEMENT ONLY  The patient does apply for the [facility] Adult ICU Electrolyte Replacment Protocol based on the criteria listed below:   1. Is GFR >/= 40 ml/min? Yes.    Patient's GFR today is >60 2. Is urine output >/= 0.5 ml/kg/hr for the last 6 hours? Yes.   Patient's UOP is 2 ml/kg/hr 3. Is BUN < 60 mg/dL? Yes.    Patient's BUN today is 28 4. Abnormal electrolyte(s): K-3.6 5. Ordered repletion with: per protocol 6. If a panic level lab has been reported, has the CCM MD in charge been notified? Yes.  .   Physician:  Dr. Terrill Mohr, Philis Nettle 01/02/2019 5:50 AM

## 2019-01-02 NOTE — Progress Notes (Signed)
Looks pretty good this morning.  He is awake and aware.  He is following commands with all 4 extremities.  He has no evidence of weakness.  His wound is clean and dry.  Status post suboccipital craniectomy and evacuation of cerebellar hematoma.  Patient overall doing well.  Continue efforts at mobilization.  No new recommendations from our standpoint.

## 2019-01-02 NOTE — Progress Notes (Signed)
  Speech Language Pathology Treatment: Dysphagia  Patient Details Name: Anes Rigel MRN: [id number] DOB: 02/26/1946 Today's Date: 01/02/2019 Time: 1210-1220 SLP Time Calculation (min) (ACUTE ONLY): 10 min  Assessment / Plan / Recommendation Clinical Impression  Pt again failed 30 oz water swallow, but seemed to tolerate puree without difficulty. Respiration are congested and cough is weak. MBS recommended prior to initiating POs, pt eager to drink so will attempt today.   HPI HPI: Patient is a 73 y.o. male admitted after almost passing out. CT revealed large left cerebellar hemorrhage. MRI showed large left cerebellar hemorrhage with intraventricular extension without hydrocephalus. Intubated 6/17-6/18. CXR The lungs are clear without evidence of a significant pneumothorax or pleural effusion. PMH: tobacco abuse, PE, NSTEMI, ETOH abuse, HTN.      SLP Plan  MBS       Recommendations  Diet recommendations: NPO Medication Administration: Via alternative means                Plan: MBS       GO              Herbie Baltimore, MA CCC-SLP  Acute Rehabilitation Services Pager [phone] Office [phone]   Lynann Beaver 01/02/2019, 1:52 PM

## 2019-01-02 NOTE — Progress Notes (Signed)
Modified Barium Swallow Progress Note  Patient Details  Name: Randy [name] MRN: [id number] Date of Birth: January 01, 1946  Today's Date: 01/02/2019  Modified Barium Swallow completed.  Full report located under Chart Review in the Imaging Section.  Brief recommendations include the following:  Clinical Impression  Pt demonstrates moderate aspiration risk with a mild dysphagia primarily respirtory based. Oral deficits are present resulting in decreased bolus cohesion with solids and with pills, though mastication complete. When taking sips of thin liquids at a slow pace, one sip at a time, swallow function is WNL. Due to intermittent timing/coordination impairment, likely due to shortness of breath, pt did have one instance of trace sensed aspriation with consecutive straw sips, and then one instance of significant silent aspiration when trying to coordinate continuous sips of thin to transit pill. Pt is very congested and cough is weak. Given good timing and airway protection with nectar thick liquids recommend Dys 2/nectar diet. Pills should be given whole in puree. NG tube did not significantly interfere with swallow, so pt may keep this in place until toelrance is determined.    Swallow Evaluation Recommendations       SLP Diet Recommendations: Dysphagia 2 (Fine chop) solids;Nectar thick liquid   Liquid Administration via: Cup;Straw   Medication Administration: Whole meds with puree   Supervision: Staff to assist with self feeding   Compensations: Slow rate;Small sips/bites   Postural Changes: Seated upright at 90 degrees   Oral Care Recommendations: Oral care BID      Herbie Baltimore, MA CCC-SLP  Acute Rehabilitation Services Pager [phone] Office [phone]   Kynzie Polgar, Katherene Ponto 01/02/2019,3:25 PM

## 2019-01-02 NOTE — Progress Notes (Addendum)
STROKE TEAM PROGRESS NOTE   INTERVAL HISTORY Pt remains extubated and his breathing satisfactorily but has. Has some secretion in the throat which he has been trying to cough out and can be suctioned out easily.Marland Kitchen  He is following commands, still has right gaze preference and left gaze palsy. On 3% and serum sodium is at goal.  Blood pressure adequately controlled.  Vitals:   01/02/19 0747 01/02/19 0800 01/02/19 0806 01/02/19 1100  BP:  (!) 150/79    Pulse:  86    Resp:  (!) 29    Temp: 97.6 F (36.4 C)   98.2 F (36.8 C)  TempSrc: Oral   Oral  SpO2:  93% 92%   Weight:      Height:        CBC:  Recent Labs  Lab 01/01/19 0442 01/02/19 0352  WBC 15.8* 16.2*  HGB 12.2* 12.2*  HCT 36.6* 37.1*  MCV 91.5 91.6  PLT 167 097    Basic Metabolic Panel:  Recent Labs  Lab 01/01/19 0442  01/02/19 0352 01/02/19 1017  NA 144   < > 154* 156*  K 3.8  --  3.6  --   CL 116*  --  125*  --   CO2 19*  --  20*  --   GLUCOSE 148*  --  167*  --   BUN 25*  --  28*  --   CREATININE 1.02  --  0.92  --   CALCIUM 8.4*  --  9.2  --    < > = values in this interval not displayed.   Lipid Panel:     Component Value Date/Time   CHOL 126 12/17/2018 1803   CHOL 123 08/18/2018 0947   TRIG 100 12/29/2018 1803   HDL 58 12/20/2018 1803   HDL 68 08/18/2018 0947   CHOLHDL 2.2 12/22/2018 1803   VLDL 20 12/22/2018 1803   LDLCALC 48 12/18/2018 1803   LDLCALC 43 08/18/2018 0947   HgbA1c:  Lab Results  Component Value Date   HGBA1C 6.1 (H) 01/12/2019   Urine Drug Screen:     Component Value Date/Time   LABOPIA NONE DETECTED 12/19/2018 2301   COCAINSCRNUR NONE DETECTED 01/09/2019 2301   LABBENZ NONE DETECTED 12/15/2018 2301   AMPHETMU NONE DETECTED 01/08/2019 2301   THCU NONE DETECTED 12/31/2018 2301   LABBARB NONE DETECTED 12/24/2018 2301    Alcohol Level     Component Value Date/Time   ETH <10 06/29/2018 1035    IMAGING  Ct Head Wo Contrast 01/01/2019 IMPRESSION:  1. Stable  volume of intracranial hemorrhage that is mainly intraventricular.  2. Interval normalization of lateral ventricular volume.    Dg Chest Port 1 View 01/01/2019 IMPRESSION:  Enlargement of cardiac silhouette post CABG. Atelectasis versus consolidation LEFT lower lobe increased since previous exam.     PHYSICAL EXAM  Temp:  [97.6 F (36.4 C)-100.1 F (37.8 C)] 98.2 F (36.8 C) (06/20 1100) Pulse Rate:  [63-108] 86 (06/20 0800) Resp:  [20-36] 29 (06/20 0800) BP: (125-193)/(62-109) 150/79 (06/20 0800) SpO2:  [91 %-98 %] 92 % (06/20 0806)  General - Well nourished, well developed, elderly Caucasian male not in distress. Ophthalmologic - fundi not visualized due to noncooperation.  Cardiovascular - Regular rate and rhythm.  Neuro -awake alert and interactive.  Able to following simple commands, speech fluent, able to name and repeat. Eyes in right gaze preference position, left gaze palsy, no nystagmus, visual field full, PERRL.  Facial symmetric.  Tongue  midline on protrusion. Moving all extremities equally on commands, at least 4/5. DTR 1+ and no babinski. Sensation symmetrical as per pt, coordination intact on the right FTN although slow but ataxia on the left FTN and gait not tested.   ASSESSMENT/PLAN Randy [name] is a 73 y.o. male with history of tobacco abuse, and pulmonary embolus, paroxysmal atrial fibrillation, hypertension, alcohol abuse with history of coagulopathy in setting of Xarelto from prior alcoholism presenting with dizziness, diaphoresis and bradycardia. Initially felt to be cardiac but cerebellar bleed found on imaging.   Hemorrhage: Left cerebellar ICH with IVH on Xarelto and aspirin, s/p [facility] reversal and suboccipital hematoma evacuation  Neurosurgery consult Dr. Ronnald Ramp, post suboccipital Craniectomy 6/17  CT head 6/17 left cerebellar hemorrhage 28 mils with IVH.  Posterior fossa without hydrocephalus  CTA head motion degraded.  No gross AVM or aneurysm  CT  head 6/18 L suboccipital craniotomy, evacuation L cerebellar hemorrhage.  Decreased mass-effect on fourth ventricle and posterior fossa.  IVH bilaterally.  Minimal pneumocephalus.  Atherosclerosis.  CT repeat stable hematoma s/p evacuation and IVH, no hydrocephalus but right hemisphere developing hygroma  CT Head - 01/01/2019 - Stable volume of intracranial hemorrhage that is mainly intraventricular.   MRI to rule out hemorrhagic infarct once able to lay flat longer   2D Echo EF 50 to 55%  LDL 48  HgbA1c 6.1  SCDs for VTE prophylaxis  aspirin 81 mg daily and Xarelto (rivaroxaban) daily prior to admission, now on No antithrombotic given hemorrhage.   Therapy recommendations:  CIR recommended  Disposition:  pending   Cerebral Edema - Induced Hypernatremia  On 3% saline @ 50 cc/hr  Also on dexamethasone per neurosurgery  Na 136->138->144->147->144->154->156  Goal Na 150-155  Check Na q 6h  Keep foley  CT repeat stable hematoma s/p evacuation and IVH, no hydrocephalus but right hemisphere developing hygroma  CT Head - 01/01/2019 - Stable volume of intracranial hemorrhage that is mainly intraventricular.   Hydrocephalus and right hemisphere hygroma   Repeat CT 12/31/2018 showed mild developing hydrocephalus  At this time no indication for EVD  CT repeat stable hematoma s/p evacuation and IVH, no hydrocephalus but right hemisphere developing hygroma  CT Head - 01/01/2019 - Stable volume of intracranial hemorrhage that is mainly intraventricular.   Hygroma on the right will need close monitoring  Acute Hypoxic Respiratory failure   Secondary to stroke   Intubated in ED   Extubated 12/31/18  Tolerating well  Continue to have copious secretions  On 3% neb Q6h while awake  NT suction  Post CABG Atrial Fibrillation  CABG 06/2018  Home anticoagulation:  Xarelto (rivaroxaban) daily   Reversed with [facility]  Hypertensive emergency  Home meds: Cozaar 100, Toprol  50  Systolic blood pressure greater than 200s with hemorrhage   Treated with Cleviprex   Currently stable  SBP goal <160   Restlessness  Developed restlessness after extubation  Put on Precedex, now off  Close monitoring  Hyperlipidemia  Home meds: Lipitor 80  LDL 48, at goal < 70  Hold statin given hemorrhage  Consider continuation of statin lower dose at discharge  Dysphagia   Secondary to stroke   Currently n.p.o.   Speech following  Cortrak placed 01/01/2019  Now on tube feeds  BP home meds: Cozaar 100, Toprol 50 -> resumed  Leukocytosis   WBC 14.8->12.5->15.8->16.2 (on Decadron)  TMax 100.2->afebrile->T max 100.1   UA - 01/01/2019 - normal  CXR - 01/01/2019 - Atelectasis versus consolidation LEFT  lower lobe increased since previous exam.   CXR - 01/02/2019 - pending  Other Stroke Risk Factors  Advanced age  Former smoker, quit smoking 6 months ago  History of alcohol abuse, last drink 10 years ago  Coronary artery disease, NSTEMI s/p CABG, December 2019  Other Active Problems  Mildly elevated troponin 0.05, reactionary  Hospital day # 3 Plan continue hypertonic saline at the present rate with serum sodium goal 150-155.  Repeat CT scan tomorrow morning and if stable will begin tapering hypertonic saline.  Insert PICC line today.  Continue strict blood pressure control with systolic blood pressure goal below 160.  Start Lovenox for DVT prevention.  Discussed with patient's nurse at the bedside and with Dr. Baltazar Apo pulmonary critical care medicine and answered questions. This patient is critically ill due to cerebellar hemorrhage, status post surgical evacuation, cerebral edema, hypertensive emergency, A. fib on anticoagulation and at significant risk of neurological worsening, death form hematoma expansion, brain herniation, heart failure, seizure. This patient's care requires constant monitoring of vital signs, hemodynamics, respiratory and  cardiac monitoring, review of multiple databases, neurological assessment, discussion with family, other specialists and medical decision making of high complexity. I spent 45minutes of neurocritical care time in the care of this patient.  Randy Contras, MD Medical Director [facility] Pager: [phone] 01/02/2019 2:14 PM    To contact Stroke Continuity provider, please refer to [email or web address] After hours, contact General Neurology

## 2019-01-02 NOTE — Progress Notes (Signed)
Spoke with Mickel Baas, RN concerning PICC order. PICC was removed on 6/18 in anticipation that 3% saline wouldn't be restarted. It is a concern of the physician that the need continues, therefore, PICC has been reordered. Per Mickel Baas, RN patient can sign consent. Efforts will be made to place this PICC by end of day.

## 2019-01-03 ENCOUNTER — Inpatient Hospital Stay (HOSPITAL_COMMUNITY): Payer: Medicare Other

## 2019-01-03 ENCOUNTER — Inpatient Hospital Stay (HOSPITAL_COMMUNITY): Payer: Medicare Other | Admitting: Certified Registered Nurse Anesthetist

## 2019-01-03 ENCOUNTER — Encounter (HOSPITAL_COMMUNITY): Admission: EM | Disposition: E | Payer: Self-pay | Source: Home / Self Care | Attending: Neurology

## 2019-01-03 HISTORY — PX: CRANIOTOMY: SHX93

## 2019-01-03 LAB — POCT I-STAT 7, (LYTES, BLD GAS, ICA,H+H)
Acid-base deficit: 1 mmol/L (ref 0.0–2.0)
Bicarbonate: 22.3 mmol/L (ref 20.0–28.0)
Bicarbonate: 24.7 mmol/L (ref 20.0–28.0)
Bicarbonate: 22.3 mmol/L (ref 20.0–28.0)
Calcium, Ion: 1.33 mmol/L (ref 1.15–1.40)
Calcium, Ion: 1.33 mmol/L (ref 1.15–1.40)
Calcium, Ion: 1.26 mmol/L (ref 1.15–1.40)
HCT: 35 % — ABNORMAL LOW (ref 39.0–52.0)
HCT: 33 % — ABNORMAL LOW (ref 39.0–52.0)
HCT: 28 % — ABNORMAL LOW (ref 39.0–52.0)
Hemoglobin: 11.9 g/dL — ABNORMAL LOW (ref 13.0–17.0)
Hemoglobin: 11.2 g/dL — ABNORMAL LOW (ref 13.0–17.0)
Hemoglobin: 9.5 g/dL — ABNORMAL LOW (ref 13.0–17.0)
O2 Saturation: 93 %
O2 Saturation: 100 %
O2 Saturation: 100 %
Patient temperature: 98.9
Patient temperature: 99.7
Patient temperature: 36
Potassium: 3.7 mmol/L (ref 3.5–5.1)
Potassium: 3.7 mmol/L (ref 3.5–5.1)
Potassium: 3.9 mmol/L (ref 3.5–5.1)
Sodium: 156 mmol/L — ABNORMAL HIGH (ref 135–145)
Sodium: 156 mmol/L — ABNORMAL HIGH (ref 135–145)
Sodium: 155 mmol/L — ABNORMAL HIGH (ref 135–145)
TCO2: 23 mmol/L (ref 22–32)
TCO2: 26 mmol/L (ref 22–32)
TCO2: 23 mmol/L (ref 22–32)
pCO2 arterial: 29.3 mmHg — ABNORMAL LOW (ref 32.0–48.0)
pCO2 arterial: 38.9 mmHg (ref 32.0–48.0)
pCO2 arterial: 31.2 mmHg — ABNORMAL LOW (ref 32.0–48.0)
pH, Arterial: 7.491 — ABNORMAL HIGH (ref 7.350–7.450)
pH, Arterial: 7.414 (ref 7.350–7.450)
pH, Arterial: 7.458 — ABNORMAL HIGH (ref 7.350–7.450)
pO2, Arterial: 61 mmHg — ABNORMAL LOW (ref 83.0–108.0)
pO2, Arterial: 278 mmHg — ABNORMAL HIGH (ref 83.0–108.0)
pO2, Arterial: 168 mmHg — ABNORMAL HIGH (ref 83.0–108.0)

## 2019-01-03 LAB — CBC
HCT: 36.3 % — ABNORMAL LOW (ref 39.0–52.0)
Hemoglobin: 11.8 g/dL — ABNORMAL LOW (ref 13.0–17.0)
MCH: 30.2 pg (ref 26.0–34.0)
MCHC: 32.5 g/dL (ref 30.0–36.0)
MCV: 92.8 fL (ref 80.0–100.0)
Platelets: 149 10*3/uL — ABNORMAL LOW (ref 150–400)
RBC: 3.91 MIL/uL — ABNORMAL LOW (ref 4.22–5.81)
RDW: 15.9 % — ABNORMAL HIGH (ref 11.5–15.5)
WBC: 14.1 10*3/uL — ABNORMAL HIGH (ref 4.0–10.5)
nRBC: 0 % (ref 0.0–0.2)

## 2019-01-03 LAB — BASIC METABOLIC PANEL
Anion gap: 9 (ref 5–15)
BUN: 40 mg/dL — ABNORMAL HIGH (ref 8–23)
CO2: 22 mmol/L (ref 22–32)
Calcium: 9.5 mg/dL (ref 8.9–10.3)
Chloride: 125 mmol/L — ABNORMAL HIGH (ref 98–111)
Creatinine, Ser: 1.1 mg/dL (ref 0.61–1.24)
GFR calc Af Amer: >60 mL/min (ref >60)
GFR calc non Af Amer: >60 mL/min (ref >60)
Glucose, Bld: 179 mg/dL — ABNORMAL HIGH (ref 70–99)
Potassium: 3.9 mmol/L (ref 3.5–5.1)
Sodium: 156 mmol/L — ABNORMAL HIGH (ref 135–145)

## 2019-01-03 LAB — GLUCOSE, CAPILLARY
Glucose-Capillary: 135 mg/dL — ABNORMAL HIGH (ref 70–99)
Glucose-Capillary: 146 mg/dL — ABNORMAL HIGH (ref 70–99)
Glucose-Capillary: 159 mg/dL — ABNORMAL HIGH (ref 70–99)
Glucose-Capillary: 168 mg/dL — ABNORMAL HIGH (ref 70–99)
Glucose-Capillary: 169 mg/dL — ABNORMAL HIGH (ref 70–99)
Glucose-Capillary: 181 mg/dL — ABNORMAL HIGH (ref 70–99)

## 2019-01-03 LAB — SODIUM
Sodium: 153 mmol/L — ABNORMAL HIGH (ref 135–145)
Sodium: 153 mmol/L — ABNORMAL HIGH (ref 135–145)

## 2019-01-03 LAB — TRIGLYCERIDES: Triglycerides: 100 mg/dL (ref <150)

## 2019-01-03 SURGERY — CRANIOTOMY HEMATOMA EVACUATION SUBDURAL
Anesthesia: General | Site: Head | Laterality: Right

## 2019-01-03 MED ORDER — MIDAZOLAM HCL 2 MG/2ML IJ SOLN
2.0000 mg | Freq: Once | INTRAMUSCULAR | Status: AC
  Administered 2019-01-03: 10:00:00 2 mg via INTRAVENOUS

## 2019-01-03 MED ORDER — RACEPINEPHRINE HCL 2.25 % IN NEBU
INHALATION_SOLUTION | RESPIRATORY_TRACT | Status: AC
  Administered 2019-01-03: 03:00:00
  Filled 2019-01-03: qty 0.5

## 2019-01-03 MED ORDER — ONDANSETRON HCL 4 MG/2ML IJ SOLN
INTRAMUSCULAR | Status: DC | PRN
  Administered 2019-01-03: 4 mg via INTRAVENOUS

## 2019-01-03 MED ORDER — CEFAZOLIN SODIUM-DEXTROSE 2-4 GM/100ML-% IV SOLN
2.0000 g | Freq: Three times a day (TID) | INTRAVENOUS | Status: DC
  Administered 2019-01-03 – 2019-01-09 (×18): 2 g via INTRAVENOUS
  Filled 2019-01-03 (×20): qty 100

## 2019-01-03 MED ORDER — GLYCOPYRROLATE 0.2 MG/ML IJ SOLN
INTRAMUSCULAR | Status: DC | PRN
  Administered 2019-01-03: 0.2 mg via INTRAVENOUS

## 2019-01-03 MED ORDER — ONDANSETRON HCL 4 MG/2ML IJ SOLN
INTRAMUSCULAR | Status: AC
  Filled 2019-01-03: qty 2

## 2019-01-03 MED ORDER — PHENYLEPHRINE 40 MCG/ML (10ML) SYRINGE FOR IV PUSH (FOR BLOOD PRESSURE SUPPORT)
PREFILLED_SYRINGE | INTRAVENOUS | Status: DC | PRN
  Administered 2019-01-03 (×2): 80 ug via INTRAVENOUS

## 2019-01-03 MED ORDER — CEFAZOLIN SODIUM 1 G IJ SOLR
INTRAMUSCULAR | Status: AC
  Filled 2019-01-03: qty 20

## 2019-01-03 MED ORDER — DEXAMETHASONE SODIUM PHOSPHATE 10 MG/ML IJ SOLN
INTRAMUSCULAR | Status: AC
  Filled 2019-01-03: qty 1

## 2019-01-03 MED ORDER — MICROFIBRILLAR COLL HEMOSTAT EX PADS
MEDICATED_PAD | CUTANEOUS | Status: DC | PRN
  Administered 2019-01-03: 1 via TOPICAL

## 2019-01-03 MED ORDER — EPHEDRINE SULFATE 50 MG/ML IJ SOLN
INTRAMUSCULAR | Status: DC | PRN
  Administered 2019-01-03: 15 mg via INTRAVENOUS

## 2019-01-03 MED ORDER — SUCCINYLCHOLINE CHLORIDE 20 MG/ML IJ SOLN
70.0000 mg | Freq: Once | INTRAMUSCULAR | Status: AC
  Administered 2019-01-03: 70 mg via INTRAVENOUS
  Filled 2019-01-03: qty 3.5

## 2019-01-03 MED ORDER — MIDAZOLAM HCL 2 MG/2ML IJ SOLN
INTRAMUSCULAR | Status: AC
  Filled 2019-01-03: qty 2

## 2019-01-03 MED ORDER — ORAL CARE MOUTH RINSE
15.0000 mL | OROMUCOSAL | Status: DC
  Administered 2019-01-03 – 2019-01-10 (×65): 15 mL via OROMUCOSAL

## 2019-01-03 MED ORDER — THROMBIN 5000 UNITS EX SOLR
CUTANEOUS | Status: AC
  Filled 2019-01-03: qty 5000

## 2019-01-03 MED ORDER — BACITRACIN ZINC 500 UNIT/GM EX OINT
TOPICAL_OINTMENT | CUTANEOUS | Status: DC | PRN
  Administered 2019-01-03: 1 via TOPICAL

## 2019-01-03 MED ORDER — BACITRACIN ZINC 500 UNIT/GM EX OINT
TOPICAL_OINTMENT | CUTANEOUS | Status: AC
  Filled 2019-01-03: qty 28.35

## 2019-01-03 MED ORDER — ROCURONIUM BROMIDE 10 MG/ML (PF) SYRINGE
PREFILLED_SYRINGE | INTRAVENOUS | Status: AC
  Filled 2019-01-03: qty 10

## 2019-01-03 MED ORDER — PROPOFOL 10 MG/ML IV BOLUS
INTRAVENOUS | Status: AC
  Filled 2019-01-03: qty 20

## 2019-01-03 MED ORDER — CHLORHEXIDINE GLUCONATE 0.12% ORAL RINSE (MEDLINE KIT)
15.0000 mL | Freq: Two times a day (BID) | OROMUCOSAL | Status: DC
  Administered 2019-01-03 – 2019-01-10 (×14): 15 mL via OROMUCOSAL

## 2019-01-03 MED ORDER — THROMBIN 20000 UNITS EX SOLR
CUTANEOUS | Status: DC | PRN
  Administered 2019-01-03: 20 mL via TOPICAL

## 2019-01-03 MED ORDER — RACEPINEPHRINE HCL 2.25 % IN NEBU: 0.5000 mL | INHALATION_SOLUTION | RESPIRATORY_TRACT | Status: DC | PRN

## 2019-01-03 MED ORDER — THROMBIN 20000 UNITS EX KIT
PACK | CUTANEOUS | Status: AC
  Filled 2019-01-03: qty 1

## 2019-01-03 MED ORDER — DEXAMETHASONE SODIUM PHOSPHATE 10 MG/ML IJ SOLN
INTRAMUSCULAR | Status: DC | PRN
  Administered 2019-01-03: 4 mg via INTRAVENOUS

## 2019-01-03 MED ORDER — PROPOFOL 1000 MG/100ML IV EMUL
INTRAVENOUS | Status: AC
  Administered 2019-01-03: 20 ug/kg/min via INTRAVENOUS
  Filled 2019-01-03: qty 100

## 2019-01-03 MED ORDER — ETOMIDATE 2 MG/ML IV SOLN
20.0000 mg | Freq: Once | INTRAVENOUS | Status: AC
  Administered 2019-01-03: 20 mg via INTRAVENOUS

## 2019-01-03 MED ORDER — THROMBIN 5000 UNITS EX SOLR
OROMUCOSAL | Status: DC | PRN
  Administered 2019-01-03: 5 mL via TOPICAL

## 2019-01-03 MED ORDER — FENTANYL CITRATE (PF) 250 MCG/5ML IJ SOLN
INTRAMUSCULAR | Status: AC
  Filled 2019-01-03: qty 5

## 2019-01-03 MED ORDER — SODIUM CHLORIDE 0.9 % IV SOLN
INTRAVENOUS | Status: DC | PRN
  Administered 2019-01-03: 08:00:00 via INTRAVENOUS

## 2019-01-03 MED ORDER — ROCURONIUM BROMIDE 10 MG/ML (PF) SYRINGE
PREFILLED_SYRINGE | INTRAVENOUS | Status: DC | PRN
  Administered 2019-01-03: 50 mg via INTRAVENOUS
  Administered 2019-01-03: 20 mg via INTRAVENOUS

## 2019-01-03 MED ORDER — FENTANYL CITRATE (PF) 250 MCG/5ML IJ SOLN
INTRAMUSCULAR | Status: DC | PRN
  Administered 2019-01-03: 50 ug via INTRAVENOUS
  Administered 2019-01-03: 100 ug via INTRAVENOUS
  Administered 2019-01-03 (×2): 50 ug via INTRAVENOUS

## 2019-01-03 MED ORDER — PROPOFOL 1000 MG/100ML IV EMUL
0.0000 ug/kg/min | INTRAVENOUS | Status: DC
  Administered 2019-01-03: 20 ug/kg/min via INTRAVENOUS
  Administered 2019-01-03 (×2): 10 ug/kg/min via INTRAVENOUS
  Administered 2019-01-04: 25 ug/kg/min via INTRAVENOUS
  Filled 2019-01-03 (×2): qty 100

## 2019-01-03 MED ORDER — 0.9 % SODIUM CHLORIDE (POUR BTL) OPTIME
TOPICAL | Status: DC | PRN
  Administered 2019-01-03 (×2): 1000 mL

## 2019-01-03 MED ORDER — SODIUM CHLORIDE 0.9 % IV SOLN
INTRAVENOUS | Status: DC | PRN
  Administered 2019-01-03: 09:00:00 10 ug/min via INTRAVENOUS

## 2019-01-03 SURGICAL SUPPLY — 70 items
BAG DECANTER FOR FLEXI CONT (MISCELLANEOUS) ×3 IMPLANT
BANDAGE GAUZE 4  KLING STR (GAUZE/BANDAGES/DRESSINGS) IMPLANT
BIT DRILL WIRE PASS 1.3MM (BIT) ×1 IMPLANT
BLADE SURG 11 STRL SS (BLADE) IMPLANT
BNDG COHESIVE 4X5 TAN NS LF (GAUZE/BANDAGES/DRESSINGS) IMPLANT
BUR ACORN 6.0 PRECISION (BURR) ×2 IMPLANT
BUR ACORN 6.0MM PRECISION (BURR) ×1
BUR SPIRAL ROUTER 2.3 (BUR) ×2 IMPLANT
BUR SPIRAL ROUTER 2.3MM (BUR) ×1
CANISTER SUCT 3000ML PPV (MISCELLANEOUS) ×3 IMPLANT
CARTRIDGE OIL MAESTRO DRILL (MISCELLANEOUS) ×1 IMPLANT
CATH VENTRIC 35X38 W/TROCAR LG (CATHETERS) ×3 IMPLANT
CLIP VESOCCLUDE MED 6/CT (CLIP) IMPLANT
COVER BACK TABLE 60X90IN (DRAPES) ×6 IMPLANT
COVER WAND RF STERILE (DRAPES) ×3 IMPLANT
DERMABOND ADVANCED (GAUZE/BANDAGES/DRESSINGS)
DERMABOND ADVANCED .7 DNX12 (GAUZE/BANDAGES/DRESSINGS) IMPLANT
DIFFUSER DRILL AIR PNEUMATIC (MISCELLANEOUS) ×3 IMPLANT
DRAIN CHANNEL 10M FLAT 3/4 FLT (DRAIN) ×3 IMPLANT
DRAPE MICROSCOPE LEICA (MISCELLANEOUS) IMPLANT
DRAPE NEUROLOGICAL W/INCISE (DRAPES) ×3 IMPLANT
DRAPE SURG 17X23 STRL (DRAPES) IMPLANT
DRAPE WARM FLUID 44X44 (DRAPES) ×3 IMPLANT
DRILL WIRE PASS 1.3MM (BIT) ×3
ELECT CAUTERY BLADE 6.4 (BLADE) ×3 IMPLANT
ELECT REM PT RETURN 9FT ADLT (ELECTROSURGICAL) ×3
ELECTRODE REM PT RTRN 9FT ADLT (ELECTROSURGICAL) ×1 IMPLANT
EVACUATOR SILICONE 100CC (DRAIN) ×3 IMPLANT
GAUZE 4X4 16PLY RFD (DISPOSABLE) IMPLANT
GAUZE SPONGE 4X4 12PLY STRL (GAUZE/BANDAGES/DRESSINGS) ×3 IMPLANT
GLOVE BIO SURGEON STRL SZ 6.5 (GLOVE) ×2 IMPLANT
GLOVE BIO SURGEON STRL SZ7 (GLOVE) ×6 IMPLANT
GLOVE BIO SURGEONS STRL SZ 6.5 (GLOVE) ×1
GLOVE ECLIPSE 9.0 STRL (GLOVE) ×3 IMPLANT
GLOVE EXAM NITRILE XL STR (GLOVE) IMPLANT
GOWN STRL REUS W/ TWL LRG LVL3 (GOWN DISPOSABLE) IMPLANT
GOWN STRL REUS W/ TWL XL LVL3 (GOWN DISPOSABLE) IMPLANT
GOWN STRL REUS W/TWL 2XL LVL3 (GOWN DISPOSABLE) IMPLANT
GOWN STRL REUS W/TWL LRG LVL3 (GOWN DISPOSABLE)
GOWN STRL REUS W/TWL XL LVL3 (GOWN DISPOSABLE)
HEMOSTAT SURGICEL 2X14 (HEMOSTASIS) IMPLANT
KIT BASIN OR (CUSTOM PROCEDURE TRAY) ×3 IMPLANT
KIT TURNOVER KIT B (KITS) ×3 IMPLANT
NEEDLE HYPO 25X1 1.5 SAFETY (NEEDLE) IMPLANT
NS IRRIG 1000ML POUR BTL (IV SOLUTION) ×6 IMPLANT
OIL CARTRIDGE MAESTRO DRILL (MISCELLANEOUS) ×3
PACK CRANIOTOMY CUSTOM (CUSTOM PROCEDURE TRAY) ×3 IMPLANT
PAD ARMBOARD 7.5X6 YLW CONV (MISCELLANEOUS) ×6 IMPLANT
PATTIES SURGICAL .25X.25 (GAUZE/BANDAGES/DRESSINGS) IMPLANT
PATTIES SURGICAL .5 X.5 (GAUZE/BANDAGES/DRESSINGS) IMPLANT
PATTIES SURGICAL .5 X3 (DISPOSABLE) IMPLANT
PATTIES SURGICAL 1X1 (DISPOSABLE) IMPLANT
PIN MAYFIELD SKULL DISP (PIN) IMPLANT
PLATE 1.5  2HOLE LNG NEURO (Plate) ×6 IMPLANT
PLATE 1.5 2HOLE LNG NEURO (Plate) ×3 IMPLANT
SCREW SELF DRILL HT 1.5/4MM (Screw) ×18 IMPLANT
SPECIMEN JAR SMALL (MISCELLANEOUS) IMPLANT
SPONGE LAP 18X18 RF (DISPOSABLE) IMPLANT
SPONGE NEURO XRAY DETECT 1X3 (DISPOSABLE) IMPLANT
SPONGE SURGIFOAM ABS GEL 100 (HEMOSTASIS) ×3 IMPLANT
STAPLER VISISTAT 35W (STAPLE) ×3 IMPLANT
SUT NURALON 4 0 TR CR/8 (SUTURE) ×6 IMPLANT
SUT VIC AB 2-0 CT2 18 VCP726D (SUTURE) ×6 IMPLANT
SYR CONTROL 10ML LL (SYRINGE) ×3 IMPLANT
TAPE CLOTH SURG 4X10 WHT LF (GAUZE/BANDAGES/DRESSINGS) ×3 IMPLANT
TOWEL GREEN STERILE (TOWEL DISPOSABLE) ×3 IMPLANT
TOWEL GREEN STERILE FF (TOWEL DISPOSABLE) ×3 IMPLANT
TRAY FOLEY MTR SLVR 16FR STAT (SET/KITS/TRAYS/PACK) IMPLANT
UNDERPAD 30X30 (UNDERPADS AND DIAPERS) IMPLANT
WATER STERILE IRR 1000ML POUR (IV SOLUTION) ×3 IMPLANT

## 2019-01-03 NOTE — Progress Notes (Signed)
RN spoke with pt's Brother, Rush Landmark, and gave a brief update. Pt's brother stated that he is POA and will provide paperwork if we do not have a copy on file. Pt's family member is available by phone (can be reached at [phone]) to discuss the goals of care moving forward. Pt's son, Vira Agar, can be reached by phone as well at [phone].

## 2019-01-03 NOTE — Progress Notes (Signed)
SLP Cancellation Note  Patient Details Name: Randy [name] MRN: [id number] DOB: [birth year]   Cancelled treatment:       Reason Eval/Treat Not Completed: Medical issues which prohibited therapy. Pt developed hydrocephalus overnight and had ventriculostomy drain placed this am, then emergent craniotomy. Will follow chart for readiness for reassessment after extubation depending on progress.    Randy [name], Katherene Ponto 01/11/2019, 8:37 AM

## 2019-01-03 NOTE — Progress Notes (Signed)
Returned from ct. Dr Annette Stable in department. Pt going to OR per Dr Annette Stable

## 2019-01-03 NOTE — Progress Notes (Signed)
NAME:  Randy [name], MRN:  [id number], DOB:  06/16/1946, LOS: 4 ADMISSION DATE:  12/25/2018, CONSULTATION DATE: June 17 REFERRING MD: Dr. Rory Percy, CHIEF COMPLAINT: Intraparenchymal hemorrhage  Brief History   73 year old male anticoagulated with Xarelto presented with intraparenchymal hematoma and was emergently taken to the OR for evacuation.  Past Medical History  Tobacco Abuse  PE  PAF - on Xarelto NSTEMI  HTN  ETOH Abuse   Significant Hospital Events   6/17 Admit with cerebellar ICH. To OR for evacuation. Out to ICU intubated.   Consults:  Neurosurgery PCCM  Procedures:  ETT 6/17 > 6/18 ETT 6/21 >>  Art line 6/17 > 6/18 Ventriculostomy 6/21 >>   Significant Diagnostic Tests:  CT head 6/17 > Acute intraparenchymal hemorrhage within the left cerebellum with volume of 28 mL and intraventricular extension to the third and fourth ventricles. 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 CTA head 6/17 > Severely motion degraded study. No proximal large vessel occlusion of the anterior circulation. The hemorrhage pattern of the left cerebellum is most consistent with hypertensive hemorrhage. ECHO 6/18 > severe akinesis of the left ventricular, mid-apical apical segment and lateral wall, LVEF ~ 50-55%, No RWMA, normal RV systolic function, moderate MV regurgitation  Micro Data:  SARS-CoV-2 6/17 > neg  Antimicrobials:  Periop cefazolin 6/17  Interim history/subjective:  Events overnight reviewed: Significant neurological change, grunting respirations beginning around 2 AM.  CT with interval worsening of lateral and third ventricle obstructive hydrocephalus stable left cerebellar hematoma.  Required emergent placement of ventricular drain, intubated for this procedure.  Unfortunately subsequently developed acute right subdural hematoma that will need to be evacuated.  Dr. Trenton Gammon arranging for him  to go to the OR.  Objective   Blood pressure (!) 113/57, pulse (!) 57, temperature 99.7 F (37.6 C), temperature source Oral, resp. rate 17, height 5\' 4"  (1.626 m), weight 68.1 kg, SpO2 100 %.    Vent Mode: PRVC FiO2 (%):  [40 %-100 %] 40 % Set Rate:  [12 bmp] 12 bmp Vt Set:  [470 mL] 470 mL PEEP:  [5 cmH20] 5 cmH20 Plateau Pressure:  [18 cmH20] 18 cmH20   Intake/Output Summary (Last 24 hours) at 12/21/2018 0727 Last data filed at 12/20/2018 0500 Gross per 24 hour  Intake 1252.7 ml  Output 2375 ml  Net -1122.3 ml   Filed Weights   12/17/2018 1352 12/20/2018 2245  Weight: 68 kg 68.1 kg    Examination: General: Ill-appearing, sedated, ventilated HEENT: ET tube in place.  Ventriculostomy in place Neuro: Right pupil 5 mm and unresponsive, left pupil 2 mm, unresponsive CV: Regular, no murmur PULM: Clear bilaterally GI: Soft, nondistended, positive bowel sounds Extremities: No significant edema Skin: No rash  Resolved Hospital Problem list     Assessment & Plan:   Cerebellar Intraparenchymal Hemorrhage, evacuated 6/17 Acute progression of hydrocephalus 6/21 requiring ventriculostomy placement Acute right subdural hematoma P: Neurosurgery managing, planning to go to the OR 6/21 morning for the [facility] managing 3% saline, patient whether plan to restart Remains on Cleviprex, SBP goal < 160.  Metoprolol, losartan I stopped his enoxaparin this morning 6/21, plan to discuss with neurology and neurosurgery about timing to restart Dexamethasone tapering   Acute respiratory failure, VDRF -in setting of IPH, SDH L basilar Atx P: PRVC 8 cc/kg Wean FiO2 as able, SPO2 > 90% Sedation currently with propofol VAP prevention order set Follow intermittent chest x-ray  Atrial fibrillation on  Xarelto -currently sinus on tele. Coagulopathy reversed with [facility] in ED.  Hypertension P: Anticoagulation held Losartan, metoprolol  NAG Acidosis P:  Improving, stable Follow BMP,  urine output  Anemia  -mild  P: Follow CBC Transfuse per ICU guidelines   Hyperglycemia  P:  Sliding-scale insulin per protocol  History of ETOH Abuse  -remote and he has not used for about 10 years: LFT normal on arrival  P:  No intervention necessary at this time   Best practice:  Diet: NPO Pain/Anxiety/Delirium protocol (if indicated): Propofol VAP protocol (if indicated): 6/21 DVT prophylaxis: SCD GI prophylaxis: PPI Glucose control: SSI Mobility: BR Code Status: FULL Family Communication: Neurology and NSGY have discussed w family 6/21 Disposition: ICU, likely lateral move to 4N  Labs   CBC: Recent Labs  Lab 12/20/2018 1338  12/31/18 0540 01/01/19 0442 01/02/19 0352 01/07/2019 0235 01/02/2019 0541 12/16/2018 0545  WBC 14.8*  --  12.5* 15.8* 16.2*  --   --  14.1*  HGB 15.9   < > 12.9* 12.2* 12.2* 11.9* 11.2* 11.8*  HCT 48.9   < > 38.2* 36.6* 37.1* 35.0* 33.0* 36.3*  MCV 91.9  --  89.9 91.5 91.6  --   --  92.8  PLT 233  --  191 167 157  --   --  149*   < > = values in this interval not displayed.    Basic Metabolic Panel: Recent Labs  Lab 12/24/2018 1339 01/11/2019 1345 12/31/18 0540  01/01/19 0442  01/02/19 0352  01/02/19 1511 01/02/19 2019 12/19/2018 0235 12/17/2018 0541 01/12/2019 0545  NA 137 137 136   < > 144   < > 154*   < > 156* 155* 156* 156* 156*  K 5.0 4.9 3.8  --  3.8  --  3.6  --   --   --  3.7 3.7 3.9  CL 106 105 106  --  116*  --  125*  --   --   --   --   --  125*  CO2 19*  --  20*  --  19*  --  20*  --   --   --   --   --  22  GLUCOSE 139* 139* 130*  --  148*  --  167*  --   --   --   --   --  179*  BUN 18 22 15   --  25*  --  28*  --   --   --   --   --  40*  CREATININE 1.17 1.00 1.16  --  1.02  --  0.92  --   --   --   --   --  1.10  CALCIUM 9.2  --  8.6*  --  8.4*  --  9.2  --   --   --   --   --  9.5   < > = values in this interval not displayed.   GFR: Estimated Creatinine Clearance: 51.6 mL/min (by C-G formula based on SCr of 1.1  mg/dL). Recent Labs  Lab 12/31/18 0540 01/01/19 0442 01/02/19 0352 01/02/2019 0545  WBC 12.5* 15.8* 16.2* 14.1*    Liver Function Tests: Recent Labs  Lab 12/29/2018 1339  AST 34  ALT 30  ALKPHOS 41  BILITOT 1.2  PROT 6.8  ALBUMIN 4.1   Recent Labs  Lab 12/16/2018 1338  LIPASE 28   No results for input(s): AMMONIA in the last 168 hours.  ABG  Component Value Date/Time   PHART 7.414 01/01/2019 0541   PCO2ART 38.9 01/05/2019 0541   PO2ART 278.0 (H) 01/11/2019 0541   HCO3 24.7 12/23/2018 0541   TCO2 26 01/05/2019 0541   ACIDBASEDEF 2.5 (H) 01/12/2019 2317   O2SAT 100.0 01/11/2019 0541     Coagulation Profile: Recent Labs  Lab 01/09/2019 1338 12/31/18 0540  INR 1.6* 1.2    Cardiac Enzymes: Recent Labs  Lab 12/25/2018 1338  TROPONINI 0.05*    HbA1C: Hgb A1c MFr Bld  Date/Time Value Ref Range Status  12/20/2018 06:03 PM 6.1 (H) 4.8 - 5.6 % Final    Comment:    (NOTE) Pre diabetes:          5.7%-6.4% Diabetes:              >6.4% Glycemic control for   <7.0% adults with diabetes   06/29/2018 01:44 PM 5.4 4.8 - 5.6 % Final    Comment:    (NOTE) Pre diabetes:          5.7%-6.4% Diabetes:              >6.4% Glycemic control for   <7.0% adults with diabetes     CBG: Recent Labs  Lab 01/02/19 1120 01/02/19 1602 01/02/19 1918 01/02/19 2350 12/20/2018 0345  GLUCAP 146* 136* 164* 129* 169*    Critical care time: 32  minutes     Baltazar Apo, MD, PhD 12/26/2018, 7:27 AM [facility] [phone] or if no answer [phone]

## 2019-01-03 NOTE — Progress Notes (Signed)
Contacted Demetria, RN concerning PICC placement. Unfortunately, the patient is emergently going to surgery. Demetria, RN was told the order will be discontinued and have Neurology reorder if needed.

## 2019-01-03 NOTE — Anesthesia Postprocedure Evaluation (Signed)
Anesthesia Post Note  Patient: Randy [name]  Procedure(s) Performed: Right CRANIOTOMY HEMATOMA EVACUATION SUBDURAL. Placement of Right Ventricular Catheter (Right Head)     Patient location during evaluation: SICU Anesthesia Type: General Level of consciousness: patient remains intubated per anesthesia plan Pain management: pain level controlled Vital Signs Assessment: post-procedure vital signs reviewed and stable Respiratory status: patient remains intubated per anesthesia plan Cardiovascular status: stable Postop Assessment: no apparent nausea or vomiting Anesthetic complications: no    Last Vitals:  Vitals:   12/17/2018 1045 01/04/2019 1100  BP: 127/65 130/63  Pulse: (!) 54 (!) 53  Resp: 13 13  Temp:    SpO2: 98% 98%    Last Pain:  Vitals:   01/09/2019 1011  TempSrc: Oral  PainSc:                  Brucha Ahlquist

## 2019-01-03 NOTE — Progress Notes (Addendum)
S: Called to evaluate patient for decreased arousability, worsened speech, tachypnea and sonorous respirations. CCM also was called to the bedside to evaluate pulmonary function.   O: BP (!) 154/74   Pulse 80   Temp 98.9 F (37.2 C) (Oral)   Resp (!) 28   Ht 5\' 4"  (1.626 m)   Wt 68.1 kg   SpO2 93%   BMI 25.77 kg/m   General: Appears to be in respiratory distress Lungs: Tachypneic, using accessory muscles of respiration, sonorous respirations, hyperventilating Ext: No edema  Neurological exam: Ment: Awake, appears alert when aroused to verbal and tactile stimuli, but is confused. Speech nonfluent, halting, sparse and essentially unintelligible except for name. Rapidly changes to a depressed LOC when laid supine from 45 degrees.  CN: PERRL at 2 mm and reactive to 1.5 mm. Forced rightward gaze deviation that is not overcomable by oculocephalic maneuver.  Motor/Sensory: Decreased movement of LUE to noxious and to command. Moves RUE to command. Briskly withdraws BLE to noxious, right more than left.  Reflexes: Hyperactive throughout  A/R: 73 year old male with left cerebellar ICH with IVH on Xarelto and aspirin, s/p Ione reversal and suboccipital hematoma evacuation 1. Now with decreased mental status and what appears most consistent with central neurogenic hyperventilation.  2. Most recent CT head from 6/19 at 4:09 AM showed stable volume of intracranial hemorrhage that was mainly intraventricular, with interval normalization of lateral ventricular volume. 3. Will need STAT CT head to assess for possible worsening.  4. Neurosurgery has also been called to see the patient and is currently at the bedside.  5. Discussed with CCM and Neurosurgery  40 minutes spent in the emergent neurological evaluation and management of this critically ill patient. Time spent included coordination of care and review of prior CT scans  Addendum: STAT CT head reveals new hydrocephalus. The distribution of the  cerebellar parenchymal and intraventricular hemorrhage is unchanged. Discussed with Dr. Annette Stable, who is preparing to place a ventricular catheter. Also discussed with Dr. Carson Myrtle.   Additional 10 minutes of ICU time spent in review of CT head and coordination of care.   Electronically signed: Dr. Kerney Elbe

## 2019-01-03 NOTE — Progress Notes (Signed)
Neurosurgeon at bedside for procedure

## 2019-01-03 NOTE — Op Note (Signed)
Date of procedure: 01/01/2019  Date of dictation: Same  Service: Neurosurgery  Preoperative diagnosis: Acute right convexity subdural hematoma with active transtentorial herniation  Postoperative diagnosis: Same  Procedure Name: Emergent right craniotomy with evacuation of subdural hematoma.  Replacement of ventriculostomy.  Placement of right subdural drain  Surgeon:Dylana Shaw A.Alfie Rideaux, M.D.  Asst. Surgeon: Reinaldo Meeker, NP  Anesthesia: General  Indication: 73 year old male status post prior suboccipital craniectomy and evacuation of cerebellar hematoma.  Patient developed obstructive hydrocephalus earlier tonight requiring emergent placement of ventriculostomy.  Ventriculostomy is initially worked quite well but then patient rapidly began to decline.  CT scan demonstrated a very large right convexity subdural hematoma.  The patient is taken emergently the operating room for evacuation of hematoma.  Operative note: After induction anesthesia, patient position supine with head turned toward the left.  Patient's right scalp prepped and draped sterilely.  Curvilinear incision is made in the modified bicoronal fashion on the right side extending from the zygoma toward midline.  The previous right-sided ventriculostomy catheter was cut and trimmed before prepping the wound.  The scalp flap was then mobilized anteriorly.  The temporalis muscle was incised and mobilized with the scalp flap.  This held in place with hooks.  Right frontotemporoparietal craniotomy was then performed using high-speed drill.  Bone flap was elevated.  Dura was opened.  There is a very large amount of subdural hemorrhage.  This was evacuated with gentle suction and cup forceps.  There is a large amount of active bleeding from a bridging vein complex in the frontal region.  This was not directly adjacent to the ventricular catheter entry site.  There did not appear to be a vascular injury at the entry site.  The old catheter was removed  and a new ventricular catheter was passed along the entry site.  The ventricles were quite collapsed and I obtained no flow of CSF but I did not wish to position the catheter further.  We will monitor the placement of the catheter at a later time.  The catheter was exited through a separate stab incision.  The bleeding was controlled and stopped using bipolar electrocautery.  There is no evidence of any other active bleeding.  The brain itself was normal in color and turgor with good pulsation however the brain itself was quite depressed and not reexpanded to the anatomy defect.  I placed a 10 mm Blake drain in the subdural space.  The dura was loosely reapproximated.  Bone flap was then reapproximated using OsteoMed plates.  Scalp was reapproximated using 2-0 Vicryl sutures as was the temporalis muscle.  Sterile dressing was applied.  Ventriculostomy catheter was hooked to an external drainage system and the subdural drain was connected to a bulb suction.  There were no apparent complications.  Patient tolerated the procedure well and he returned to the recovery room in critical condition.

## 2019-01-03 NOTE — Progress Notes (Signed)
Neuro Md at bedside

## 2019-01-03 NOTE — Progress Notes (Signed)
S/p ventriculostomy. Patient now posturing per nursing with right pupil 5 mm nonreactive, left pupil 2 mm with sluggish to no reactivity.   Having episodes of bradycardia down to 30. Suspect possible herniation.   STAT CT head being ordered. Will see patient in CT.   Electronically signed: Dr. Kerney Elbe

## 2019-01-03 NOTE — Progress Notes (Addendum)
This rn and 4N nurse at bedside, 4N nurse assessed maintenance of cerebral drain, bedside report given to 4N nurse. Both nurses note pt with episodes of hr decrease to 30s, right pupil size 5 fixed dilated and no reaction, left pupil size 2 with no reaction, pt with decorticate posturing. Fio2 increased to 100%, neuro md paged and informed of changes in pt condidtion.

## 2019-01-03 NOTE — Progress Notes (Signed)
Patient worsened progressively over the last hour.  Began to extensor posture with dilation of his right pupil.  Emergent head CT scan demonstrates evidence of a very large right-sided subdural hematoma likely secondary to vascular injury with placement of his ventriculostomy.  I discussed situation with the patient's family.  They agree to move forward with emergent craniotomy and evacuation of subdural hematoma.  They understand the situation is grave.

## 2019-01-03 NOTE — Progress Notes (Signed)
Patient transported to and from CT without complications.  

## 2019-01-03 NOTE — Progress Notes (Signed)
Emergent head CT demonstrates evidence of obstructive hydrocephalus.  No new evidence of hemorrhage.  I discussed situation with the patient's brother.  I recommend that we move forward with emergent placement of a right frontal ventriculostomy to relieve his intracranial hypertension.  I discussed the risks involved with ventriculostomy placement.  The brother wishes to proceed emergently.

## 2019-01-03 NOTE — Procedures (Signed)
Endotracheal Intubation Procedure Note  Indication for endotracheal intubation: airway compromise. Airway Assessment: Mallampati Class: IV (only hard palate visible). Sedation: etomidate and midazolam. Paralytic: succinylcholine. Lidocaine: no. Atropine: no. Equipment: Macintosh 4 laryngoscope blade and 7.7mm cuffed endotracheal tube. Cricoid Pressure: yes. Number of attempts: 2. ETT location confirmed by by auscultation, by CXR and ETCO2 monitor.  Renee Pain, MD Board Certified by the ABIM, Pulmonary Diseases & Critical Care Medicine   12/21/2018

## 2019-01-03 NOTE — Op Note (Signed)
Date of procedure: 12/18/2018  Date of dictation: Same  Service: Neurosurgery  Preoperative diagnosis: Obstructive hydrocephalus  Postoperative diagnosis: Same  Procedure Name: Right frontal ventriculostomy placement  Surgeon:Mallie Giambra A.Dilia Alemany, M.D.  Asst. Surgeon: None  Anesthesia: General  Indication: 73 year old status post cerebellar hemorrhage.  Patient had been doing well following craniectomy and evacuation of hemorrhage.  Patient's mental status progressively declined through the night.  Follow-up head CT scan demonstrates evidence of worsening obstructive hydrocephalus.  Plan for right frontal ventriculostomy to relieve intracranial hypertension.  Operative note: Patient is intubated in his hospital bed.  His right frontal scalp was prepped and draped sterilely.  Incision was made along the mid pupillary line proximally 1 cm anterior to the coronal suture.  Twist drill hole was made.  The dura was then pierced using a spinal needle.  A ventriculostomy catheter was then passed into the lateral ventricle.  CSF was returned under pressure.  CSF was blood-tinged.  The catheter was then secured in place.  It was attached to a external drainage system.  Good flow was observed through the drainage system.  No apparent complications.  Patient remains critically ill in the ICU.

## 2019-01-03 NOTE — Progress Notes (Signed)
STROKE TEAM PROGRESS NOTE   INTERVAL HISTORY Pt neurological worsening early this morning with decreased arousability was in speech, tachypnea and sonorous respiration.  Stat CT scan of the head was obtained after patient was emergently intubated by critical care medicine.  CT scan showed hydrocephalus and dilated ventricles.  Patient underwent emergent ventriculostomy by Dr. Trenton Gammon but unfortunately following the procedure he had further neurological change with right pupil enlargement and non-reactivity to light with posturing and episodes of bradycardia down to 30 suggestive of herniation.  Repeat CT scan showed a large subdural hematoma with mass-effect and brain herniation.  He went for emergent craniotomy and ventriculostomy catheter was removed and a subdural drain has been placed.  I examined the patient both prior to and after the craniotomy.  His exam prior to craniotomy showed right pupil 5 mm fixed left 3 mm reactive doll's eye movement sluggish.  Decorticate posturing in both upper extremities to sternal rub.  His exam this afternoon after the surgery shows slight ventilated asynchrony with his respiration.  He is on morphine drip.  He has some semipurposeful withdrawal in the lower extremities but no responses in the upper extremities and he is no longer showing decorticate posturing.  Vitals:   01/06/2019 1245 12/29/2018 1300 12/18/2018 1315 12/26/2018 1330  BP: (!) 166/72 (!) 163/92 129/66 135/66  Pulse: 81 (!) 57 71 (!) 58  Resp: 12 13 12 12   Temp:      TempSrc:      SpO2: 100% 100% 100% 100%  Weight:      Height:        CBC:  Recent Labs  Lab 01/02/19 0352  12/21/2018 0545 12/18/2018 0931  WBC 16.2*  --  14.1*  --   HGB 12.2*   < > 11.8* 9.5*  HCT 37.1*   < > 36.3* 28.0*  MCV 91.6  --  92.8  --   PLT 157  --  149*  --    < > = values in this interval not displayed.    Basic Metabolic Panel:  Recent Labs  Lab 01/02/19 0352  12/18/2018 0545 12/29/2018 0931 12/14/2018 1329  NA 154*    < > 156* 155* 153*  K 3.6   < > 3.9 3.9  --   CL 125*  --  125*  --   --   CO2 20*  --  22  --   --   GLUCOSE 167*  --  179*  --   --   BUN 28*  --  40*  --   --   CREATININE 0.92  --  1.10  --   --   CALCIUM 9.2  --  9.5  --   --    < > = values in this interval not displayed.   Lipid Panel:     Component Value Date/Time   CHOL 126 12/26/2018 1803   CHOL 123 08/18/2018 0947   TRIG 100 12/15/2018 0545   HDL 58 12/22/2018 1803   HDL 68 08/18/2018 0947   CHOLHDL 2.2 01/09/2019 1803   VLDL 20 12/20/2018 1803   LDLCALC 48 01/04/2019 1803   LDLCALC 43 08/18/2018 0947   HgbA1c:  Lab Results  Component Value Date   HGBA1C 6.1 (H) 12/15/2018   Urine Drug Screen:     Component Value Date/Time   LABOPIA NONE DETECTED 12/27/2018 2301   COCAINSCRNUR NONE DETECTED 01/02/2019 2301   LABBENZ NONE DETECTED 12/20/2018 2301   AMPHETMU NONE DETECTED 12/21/2018 2301  THCU NONE DETECTED 12/15/2018 2301   LABBARB NONE DETECTED 01/05/2019 2301    Alcohol Level     Component Value Date/Time   ETH <10 06/29/2018 1035    IMAGING  Ct Head Wo Contrast 01/01/2019 IMPRESSION:  1. Stable volume of intracranial hemorrhage that is mainly intraventricular.  2. Interval normalization of lateral ventricular volume.    Dg Chest Port 1 View 01/01/2019 IMPRESSION:  Enlargement of cardiac silhouette post CABG. Atelectasis versus consolidation LEFT lower lobe increased since previous exam.   Ct Head Wo Contrast 01/08/2019 - 4:30 AM IMPRESSION: 1. Interval worsening of lateral and third ventricle obstructive hydrocephalus. 2. Stable left cerebellum hematoma evacuation cavity and small volume of intraventricular hemorrhage.   Ct Head Wo Contrast 01/09/2019 - 7:15 AM IMPRESSION: 1. Interval development of large acute subdural hematoma on the right measuring up to 19 mm in thickness. 18 mm midline shift to the left. 2. Interval ventricular drain placement with decompression of the ventricles 3.  Postop left occipital craniotomy for hematoma evacuation in the left cerebellum. No recurrent hemorrhage in the left cerebellum. 4. These results were called by telephone at the time of interpretation on 12/30/2018 at 7:37 am to Dr. Trenton Gammon , who verbally acknowledged these results.  Repeat Ct Head Wo Contrast - pending    PHYSICAL EXAM  Temp:  [98.2 F (36.8 C)-99.7 F (37.6 C)] 98.2 F (36.8 C) (06/21 1200) Pulse Rate:  [44-118] 58 (06/21 1330) Resp:  [10-39] 12 (06/21 1330) BP: (113-184)/(51-124) 135/66 (06/21 1330) SpO2:  [89 %-100 %] 100 % (06/21 1330) Arterial Line BP: (145-214)/(56-89) 151/61 (06/21 1330) FiO2 (%):  [40 %-100 %] 40 % (06/21 1008)  General - Well nourished, well developed, elderly Caucasian male who is intubated sedated Ophthalmologic - fundi not visualized due to noncooperation.  Cardiovascular - Regular rate and rhythm.  Neuro -sedated intubated.  Eyes are closed.  Right pupil 5 mm not reactive.  Left 3 mm reactive.  Doll's eye movement absent.  Very weak cough and gag.  Minimal lower extremity withdrawal to sternal rub.  No upper extremity movements.  Plantars not elicitable bilaterally.  ASSESSMENT/PLAN Randy [name] is a 73 y.o. male with history of tobacco abuse, and pulmonary embolus, paroxysmal atrial fibrillation, hypertension, alcohol abuse with history of coagulopathy in setting of Xarelto from prior alcoholism presenting with dizziness, diaphoresis and bradycardia. Initially felt to be cardiac but cerebellar bleed found on imaging.   Hemorrhage: Left cerebellar ICH with IVH on Xarelto and aspirin, s/p [facility] reversal and suboccipital hematoma evacuation  Neurosurgery consult Dr. Ronnald Ramp, post suboccipital Craniectomy 6/17  CT head 6/17 left cerebellar hemorrhage 28 mils with IVH.  Posterior fossa without hydrocephalus  CTA head motion degraded.  No gross AVM or aneurysm  CT head 6/18 L suboccipital craniotomy, evacuation L cerebellar hemorrhage.   Decreased mass-effect on fourth ventricle and posterior fossa.  IVH bilaterally.  Minimal pneumocephalus.  Atherosclerosis.  CT repeat stable hematoma s/p evacuation and IVH, no hydrocephalus but right hemisphere developing hygroma  CT Head - 01/01/2019 - Stable volume of intracranial hemorrhage that is mainly intraventricular.   MRI to rule out hemorrhagic infarct once able to lay flat longer   See above for Head CTs from this morning  2D Echo EF 50 to 55%  LDL 48  HgbA1c 6.1  SCDs for VTE prophylaxis  aspirin 81 mg daily and Xarelto (rivaroxaban) daily prior to admission, now on No antithrombotic given hemorrhage.   Therapy recommendations:  CIR recommended  Disposition:  pending   Cerebral Edema - Induced Hypernatremia  3% saline @ 50 cc/hr -> ? off  Also on dexamethasone per neurosurgery  Na 136->138->144->147->144->154->156->153  Goal Na 150-155  Check Na q 6h  Keep foley  CT repeat stable hematoma s/p evacuation and IVH, no hydrocephalus but right hemisphere developing hygroma  CT Head - 01/01/2019 - Stable volume of intracranial hemorrhage that is mainly intraventricular.   Hydrocephalus and right hemisphere hygroma   Repeat CT 12/31/2018 showed mild developing hydrocephalus  At this time no indication for EVD  CT repeat stable hematoma s/p evacuation and IVH, no hydrocephalus but right hemisphere developing hygroma  CT Head - 01/01/2019 - Stable volume of intracranial hemorrhage that is mainly intraventricular.   Hygroma on the right will need close monitoring  Acute Hypoxic Respiratory failure   Secondary to stroke   Intubated in ED   Extubated 12/31/18  Tolerating well  Continue to have copious secretions  On 3% neb Q6h while awake  NT suction  Post CABG Atrial Fibrillation  CABG 06/2018  Home anticoagulation:  Xarelto (rivaroxaban) daily   Reversed with [facility]  Hypertensive emergency  Home meds: Cozaar 100, Toprol 50  Systolic blood  pressure greater than 200s with hemorrhage   Treated with Cleviprex   Currently stable  SBP goal <160   Restlessness  Developed restlessness after extubation  Put on Precedex, now off  Close monitoring  Hyperlipidemia  Home meds: Lipitor 80  LDL 48, at goal < 70  Hold statin given hemorrhage  Consider continuation of statin lower dose at discharge  Dysphagia   Secondary to stroke   Currently n.p.o.   Speech following  Cortrak placed 01/01/2019  Now on tube feeds  BP home meds: Cozaar 100, Toprol 50 -> resumed  Leukocytosis   WBC 14.8->12.5->15.8->16.2 (on Decadron)->14.1  TMax 100.2->afebrile->T max 100.1->99.7  UA - 01/01/2019 - normal  CXR - 01/01/2019 - Atelectasis versus consolidation LEFT lower lobe increased since previous exam.   CXR - 01/05/2019 - Endotracheal tube tip projects 4.6 cm above the carina. Stable left basilar consolidation.   Neurologic Change and Decompensation - Obstructive hydrocephalus.    Right frontal ventriculostomy - Dr Annette Stable 5:30 this AM  Dr Cheral Marker saw pt at 6:30 AM for further neurologic change and posturing  Head CT - subdural hematoma  Dr Annette Stable took pt back to OR for a craniotomy and evacuation of subdural hematoma   Pt's condition remains tenuous - intubated  Follow up CT pending  CCM and NS have updated family   Other Stroke Risk Factors  Advanced age  Former smoker, quit smoking 6 months ago  History of alcohol abuse, last drink 10 years ago  Coronary artery disease, NSTEMI s/p CABG, December 2019  Other Active Problems  Mildly elevated troponin 0.05, reactionary  Hospital day # 4 Plan the patient unfortunately has had significant neurological worsening this morning secondary to first development of obstructive hydrocephalus and following emergent ventriculostomy he developed acute subdural hematoma with significant midline shift and transtentorial herniation requiring emergent craniotomy for  evacuation of subdural.  His neurological exam remains quite poor post surgery and prognosis is quite grim.  Continue ventilatory support and strict control of blood pressure.  Repeat CT scan of the head tomorrow morning.  Long discussion with Dr. Halford Chessman critical care medicine. I also spoke to the patient's brother as well as his son over the phone and explained his poor prognosis.  Family is quite realistic and feel he would not want  prolonged life support.  They agreed to support him for a few days to see if he makes any meaningful recovery L stable consider one-way extubation and comfort care. This patient is critically ill due to cerebellar hemorrhage, status post surgical evacuation, cerebral edema, hypertensive emergency, A. fib on anticoagulation and at significant risk of neurological worsening, death form hematoma expansion, brain herniation, heart failure, seizure. This patient's care requires constant monitoring of vital signs, hemodynamics, respiratory and cardiac monitoring, review of multiple databases, neurological assessment, discussion with family, other specialists and medical decision making of high complexity. I spent 9minutes of neurocritical care time in the care of this patient.   Antony Contras, MD  To contact Stroke Continuity provider, please refer to [email or web address] After hours, contact General Neurology

## 2019-01-03 NOTE — Anesthesia Preprocedure Evaluation (Signed)
Anesthesia Evaluation  Patient identified by MRN, date of birth, ID band Patient awake    Reviewed: Allergy & Precautions, NPO status , Patient's Chart, lab work & pertinent test results  Airway Mallampati: II  TM Distance: >3 FB     Dental   Pulmonary former smoker,    breath sounds clear to auscultation       Cardiovascular hypertension, + CAD and + Past MI   Rhythm:Regular Rate:Normal     Neuro/Psych PSYCHIATRIC DISORDERS    GI/Hepatic negative GI ROS, Neg liver ROS,   Endo/Other  negative endocrine ROS  Renal/GU negative Renal ROS     Musculoskeletal   Abdominal   Peds  Hematology   Anesthesia Other Findings   Reproductive/Obstetrics                             Anesthesia Physical Anesthesia Plan  ASA: III  Anesthesia Plan: General   Post-op Pain Management:    Induction: Intravenous  PONV Risk Score and Plan: 2 and Ondansetron, Dexamethasone and Midazolam  Airway Management Planned: Oral ETT  Additional Equipment:   Intra-op Plan:   Post-operative Plan: Possible Post-op intubation/ventilation  Informed Consent: I have reviewed the patients History and Physical, chart, labs and discussed the procedure including the risks, benefits and alternatives for the proposed anesthesia with the patient or authorized representative who has indicated his/her understanding and acceptance.     Dental advisory given  Plan Discussed with: CRNA and Anesthesiologist  Anesthesia Plan Comments:         Anesthesia Quick Evaluation

## 2019-01-03 NOTE — Progress Notes (Signed)
Patient transported to and from CT without complication.  

## 2019-01-03 NOTE — Anesthesia Procedure Notes (Signed)
Date/Time: 12/28/2018 8:17 AM Performed by: Leonor Liv, CRNA Induction Type: Inhalational induction with existing ETT Placement Confirmation: positive ETCO2 and breath sounds checked- equal and bilateral Secured at: 24 cm Dental Injury: Teeth and Oropharynx as per pre-operative assessment

## 2019-01-03 NOTE — Anesthesia Procedure Notes (Signed)
Arterial Line Insertion Start/End06/16/2020 8:30 AM, 01/03/2019 8:45 AM Performed by: Shirlyn Goltz, CRNA  Patient location: OR. Emergency situation Left, radial was placed Catheter size: 20 G Hand hygiene performed  and maximum sterile barriers used  Allen's test indicative of satisfactory collateral circulation Attempts: 3 Procedure performed without using ultrasound guided technique. Following insertion, dressing applied and Biopatch. Additional procedure comments: Placed catheter under anesthesia .

## 2019-01-03 NOTE — Progress Notes (Signed)
Pt arrived to unit from OR to 4N32. Pt placed on previous vent settings. ETT secure at 24cm at the lip. Bilateral breath sounds heard and good return volumes on the vent. RT will continue to monitor.

## 2019-01-03 NOTE — Progress Notes (Addendum)
Patient tachypneic with grunting respirations.  He appears agitated.  He mumbles a few words but does not answer questions appropriately.  He is moving all 4 extremities but not to command.  His dressing is clean and dry.  His neck is somewhat stiff.  He is afebrile.  Heart rate and blood pressure acceptable.  Fluid status is negative.  Sodium up to 156.  Patient in the process of being intubated for airway protection.  Stat CT scan is pending.  Not sure what to make of the patient's current neurologic status.  Certainly this would be somewhat atypical for re-hemorrhage or obstructive hydrocephalus.

## 2019-01-03 NOTE — Transfer of Care (Signed)
Immediate Anesthesia Transfer of Care Note  Patient: Randy [name]  Procedure(s) Performed: Right CRANIOTOMY HEMATOMA EVACUATION SUBDURAL. Placement of Right Ventricular Catheter (Right Head)  Patient Location: ICU  Anesthesia Type:General  Level of Consciousness: sedated, unresponsive and Patient remains intubated per anesthesia plan  Airway & Oxygen Therapy: Patient remains intubated per anesthesia plan and Patient placed on Ventilator (see vital sign flow sheet for setting)  Post-op Assessment: Report given to RN and Post -op Vital signs reviewed and stable  Post vital signs: Reviewed and stable  Last Vitals:  Vitals Value Taken Time  BP 123/76 12/15/2018 1011  Temp    Pulse    Resp 21 12/19/2018 1018  SpO2    Vitals shown include unvalidated device data.  Last Pain:  Vitals:   12/17/2018 0745  TempSrc: Oral  PainSc:          Complications: No apparent anesthesia complications

## 2019-01-03 NOTE — Progress Notes (Addendum)
Dr Carson Myrtle at bedside, md states pt has upper airway secretions, md states pt does not have stridor. Pt remains drowsy with poor gag reflex, continues to have bilateral weak grip to command, now with mumbled speech and able to answer yes/no questions.  Md unable to appreciate any significant notable neuro changes at this time.  Md aware of needing routine order for ct head. Md states will consult with neuro md. Dr Carson Myrtle deferred intubating pt at this time.

## 2019-01-03 NOTE — Progress Notes (Addendum)
2 RNs arrived to pt room to reposition pt less alert drowsy eyes open with blank stare, weak grip bilateral to command, able to lift both arms to command.  Pt placed high fowlers position. RT to bedside, nurse Elzie Rings and Dr Oletta Darter notified via elink, Neuro md paged. Upper airway secretions and audible stridor per nursing and RT assessment. Md to order abgs and racemic epi neb, Md will send ground to bedside.

## 2019-01-03 NOTE — Progress Notes (Addendum)
OVERNIGHT COVERAGE CRITICAL CARE PROGRESS NOTE  CTSP re: "stridor".  Additionally, the nurse reports that the patient has been demonstrating increased work of breathing and altered mental status.  He was confused but talkative at the beginning of the shift but is less verbal at this time.  On exam, the patient is demonstrating "snoring", although awake.  He does follow simple commands: can tell me his name (albeit dysarthric), tongue protrusion midline on command, and moves all four extremities on command.  Moves L side much more fully than R.  R gaze preference, which was apparently noted earlier during the day.  Diminished gag.  Notably, the patient becomes somnolent promptly when head of bed is brought down.  Case discussed with Dr. Cheral Marker.  Will get stat head CT.  Will need to intubate for CT scan.  Renee Pain, MD Board Certified by the ABIM, [place] [phone]): Brother Data processing manager) updated by phone.

## 2019-01-03 NOTE — Brief Op Note (Signed)
01/07/2019  9:56 AM  PATIENT:  Randy [name]  73 y.o. male  PRE-OPERATIVE DIAGNOSIS:  right subdural hematoma  POST-OPERATIVE DIAGNOSIS:  right subdural hematoma  PROCEDURE:  Procedure(s): Right CRANIOTOMY HEMATOMA EVACUATION SUBDURAL. Placement of Right Ventricular Catheter (Right)  SURGEON:  Surgeon(s) and Role:    * Earnie Larsson, MD - Primary  PHYSICIAN ASSISTANT:   ASSISTANTSReinaldo Meeker, NP   ANESTHESIA:   general  EBL:  100 mL   BLOOD ADMINISTERED:none  DRAINS: Ventriculostomy Drain in the right frontal horn and (10 mm ) Blake drain(s) in the subdural space   LOCAL MEDICATIONS USED:  NONE  SPECIMEN:  No Specimen  DISPOSITION OF SPECIMEN:  N/A  COUNTS:  YES  TOURNIQUET:  * No tourniquets in log *  DICTATION: .Dragon Dictation  PLAN OF CARE: Admit to inpatient   PATIENT DISPOSITION:  ICU - intubated and critically ill.   Delay start of Pharmacological VTE agent (>24hrs) due to surgical blood loss or risk of bleeding: yes

## 2019-01-04 ENCOUNTER — Inpatient Hospital Stay (HOSPITAL_COMMUNITY): Payer: Medicare Other

## 2019-01-04 ENCOUNTER — Encounter (HOSPITAL_COMMUNITY): Payer: Self-pay | Admitting: Neurosurgery

## 2019-01-04 DIAGNOSIS — G9340 Encephalopathy, unspecified: Secondary | ICD-10-CM

## 2019-01-04 DIAGNOSIS — J9601 Acute respiratory failure with hypoxia: Secondary | ICD-10-CM

## 2019-01-04 LAB — CBC
HCT: 31 % — ABNORMAL LOW (ref 39.0–52.0)
Hemoglobin: 9.9 g/dL — ABNORMAL LOW (ref 13.0–17.0)
MCH: 30.1 pg (ref 26.0–34.0)
MCHC: 31.9 g/dL (ref 30.0–36.0)
MCV: 94.2 fL (ref 80.0–100.0)
Platelets: 128 10*3/uL — ABNORMAL LOW (ref 150–400)
RBC: 3.29 MIL/uL — ABNORMAL LOW (ref 4.22–5.81)
RDW: 15.7 % — ABNORMAL HIGH (ref 11.5–15.5)
WBC: 11.1 10*3/uL — ABNORMAL HIGH (ref 4.0–10.5)
nRBC: 0.2 % (ref 0.0–0.2)

## 2019-01-04 LAB — GLUCOSE, CAPILLARY
Glucose-Capillary: 160 mg/dL — ABNORMAL HIGH (ref 70–99)
Glucose-Capillary: 161 mg/dL — ABNORMAL HIGH (ref 70–99)
Glucose-Capillary: 162 mg/dL — ABNORMAL HIGH (ref 70–99)
Glucose-Capillary: 167 mg/dL — ABNORMAL HIGH (ref 70–99)
Glucose-Capillary: 172 mg/dL — ABNORMAL HIGH (ref 70–99)
Glucose-Capillary: 198 mg/dL — ABNORMAL HIGH (ref 70–99)

## 2019-01-04 LAB — BASIC METABOLIC PANEL
Anion gap: 10 (ref 5–15)
BUN: 53 mg/dL — ABNORMAL HIGH (ref 8–23)
CO2: 20 mmol/L — ABNORMAL LOW (ref 22–32)
Calcium: 8.3 mg/dL — ABNORMAL LOW (ref 8.9–10.3)
Chloride: 121 mmol/L — ABNORMAL HIGH (ref 98–111)
Creatinine, Ser: 1.15 mg/dL (ref 0.61–1.24)
GFR calc Af Amer: >60 mL/min (ref >60)
GFR calc non Af Amer: >60 mL/min (ref >60)
Glucose, Bld: 162 mg/dL — ABNORMAL HIGH (ref 70–99)
Potassium: 3.5 mmol/L (ref 3.5–5.1)
Sodium: 151 mmol/L — ABNORMAL HIGH (ref 135–145)

## 2019-01-04 LAB — SODIUM
Sodium: 154 mmol/L — ABNORMAL HIGH (ref 135–145)
Sodium: 154 mmol/L — ABNORMAL HIGH (ref 135–145)
Sodium: 154 mmol/L — ABNORMAL HIGH (ref 135–145)

## 2019-01-04 LAB — MAGNESIUM: Magnesium: 2.5 mg/dL — ABNORMAL HIGH (ref 1.7–2.4)

## 2019-01-04 LAB — TRIGLYCERIDES: Triglycerides: 138 mg/dL (ref <150)

## 2019-01-04 MED ORDER — SENNA 8.6 MG PO TABS
1.0000 | ORAL_TABLET | Freq: Two times a day (BID) | ORAL | Status: DC
  Administered 2019-01-04 – 2019-01-10 (×10): 8.6 mg
  Filled 2019-01-04 (×10): qty 1

## 2019-01-04 MED ORDER — LOSARTAN POTASSIUM 50 MG PO TABS
100.0000 mg | ORAL_TABLET | Freq: Every day | ORAL | Status: DC
  Administered 2019-01-04 – 2019-01-07 (×4): 100 mg
  Filled 2019-01-04 (×6): qty 2

## 2019-01-04 MED ORDER — INSULIN ASPART 100 UNIT/ML ~~LOC~~ SOLN
3.0000 [IU] | SUBCUTANEOUS | Status: DC
  Administered 2019-01-04 – 2019-01-10 (×34): 3 [IU] via SUBCUTANEOUS

## 2019-01-04 MED ORDER — VITAL AF 1.2 CAL PO LIQD
1000.0000 mL | ORAL | Status: DC
  Administered 2019-01-04 – 2019-01-07 (×4): 1000 mL

## 2019-01-04 MED ORDER — METOPROLOL TARTRATE 25 MG/10 ML ORAL SUSPENSION
50.0000 mg | Freq: Two times a day (BID) | ORAL | Status: DC
  Administered 2019-01-04 – 2019-01-09 (×8): 50 mg
  Filled 2019-01-04 (×10): qty 20

## 2019-01-04 MED ORDER — LOSARTAN POTASSIUM 50 MG PO TABS: 100.0000 mg | ORAL_TABLET | Freq: Every day | ORAL | Status: DC

## 2019-01-04 MED ORDER — FENTANYL CITRATE (PF) 100 MCG/2ML IJ SOLN
12.5000 ug | INTRAMUSCULAR | Status: DC | PRN
  Administered 2019-01-04 – 2019-01-06 (×6): 12.5 ug via INTRAVENOUS
  Filled 2019-01-04 (×5): qty 2

## 2019-01-04 MED ORDER — POTASSIUM CHLORIDE 20 MEQ/15ML (10%) PO SOLN
40.0000 meq | Freq: Once | ORAL | Status: AC
  Administered 2019-01-04: 40 meq
  Filled 2019-01-04: qty 30

## 2019-01-04 MED ORDER — PANTOPRAZOLE SODIUM 40 MG PO PACK
40.0000 mg | PACK | Freq: Every day | ORAL | Status: DC
  Administered 2019-01-04 – 2019-01-09 (×6): 40 mg
  Filled 2019-01-04 (×6): qty 20

## 2019-01-04 NOTE — Progress Notes (Signed)
Nutrition Follow-up  RD working remotely.  DOCUMENTATION CODES:   Not applicable  INTERVENTION:   Tube feeding via Cortrak: - Increase Vital AF 1.2 to 55 ml/hr (1320 ml/day)  Tube feeding regimen provides 1584 kcal, 99 grams of protein, and 1071 ml of H2O (98% of needs).  - d/c Pro-stat  NUTRITION DIAGNOSIS:   Inadequate oral intake related to dysphagia as evidenced by NPO status.  Ongoing, being addressed via TF  GOAL:   Patient will meet greater than or equal to 90% of their needs  Met via TF  MONITOR:   Vent status, Labs, I & O's, Weight trends, TF tolerance, Skin  REASON FOR ASSESSMENT:   Ventilator Enteral/tube feeding initiation and management  ASSESSMENT:   73 year old male who presented to the ED on 6/17 with dizziness and diaphoresis. PMH of CAD s/p CABG, afib on Xarelto, HTN, NSTEMI, EtOH abuse. Pt was intubated in the ED after his mental status declined. CT head showing acute intraparenchymal hemorrhage on the left with intraventricular extension. S/p emergent suboccipital craniectomy for evacuation of large cerebellar hemorrhage on 6/17.  6/18 - extubated 6/19 - Cortrak placed 6/20 - MBS with recommendations for Dysphagia 2 diet and nectar-thick liquids 6/21 - CT head revealing new obstructive hydrocephalus, intubated, s/p emergent placement of right frontal ventriculostomy, repeat CT head revealing very large right-sided SDH, s/p emergent craniotomy and evacuation  Per CCM, will allow vent for one more day and if no improvement per Neuro in AM will need to discuss trach vs comfort care.  Cortrak in place with TF infusing. RD will adjust regimen to better meet pt's needs now that pt is intubated.  Current TF: Vital AF 1.2 @ 35 ml/hr, Pro-stat 30 ml BID  Patient is currently intubated on ventilator support MV: 11.2 L/min Temp (24hrs), Avg:99.7 F (37.6 C), Min:98.2 F (36.8 C), Max:101.7 F (38.7 C) BP: 151/61 MAP: 84  Cleviprex:  off  Medications reviewed and include: SSI, Novolog 3 units q 4 hours, Protonix, KCl 40 mEq once, Senna, IV abx  Labs reviewed: sodium 151, chloride 121, BUN 53, magnesium 2.5, hemoglobin 9.9 CBG's: 146-181 x 24 hours  UOP: 1460 ml x 24 hours JP drain: 245 ml x 24 hours Ventriculostomy: 0 ml x 24 hours I/O's: +1.4 L since admit  Diet Order:   Diet Order            Diet NPO time specified  Diet effective now              EDUCATION NEEDS:   No education needs have been identified at this time  Skin:  Skin Assessment: Skin Integrity Issues: Incisions: closed incisions to head  Last BM:  01/02/19  Height:   Ht Readings from Last 1 Encounters:  01/02/2019 '5\' 4"'$  (1.626 m)    Weight:   Wt Readings from Last 1 Encounters:  01/09/2019 68.1 kg    Ideal Body Weight:  59.1 kg  BMI:  Body mass index is 25.77 kg/m.  Estimated Nutritional Needs:   Kcal:  1612  Protein:  85-100 grams  Fluid:  >/= 1.7 L    Gaynell Face, MS, RD, LDN Inpatient Clinical Dietitian Pager: [phone] Weekend/After Hours: [phone]

## 2019-01-04 NOTE — Progress Notes (Signed)
Physical Therapy Discharge Patient Details Name: Randy [name] MRN: [id number] DOB: 1946/03/14 Today's Date: 01/04/2019 Time:  -     Patient discharged from PT services secondary to medical decline - will need to re-order PT to resume therapy services.  Please see latest therapy progress note for current level of functioning and progress toward goals.    Progress and discharge plan discussed with patient and/or caregiver: Patient unable to participate in discharge planning and no caregivers available   Pt with series of events in the last 12 hours resulting in re-intubation, worsening mental status, new hydrocephalus and Rt SDH s/p emergent crani and drain placement. Not appropriate for PT at this time. Will sign off for now per RN. Thanks  Randy [name]     Randy [name] 01/04/2019, 12:03 PM  Wray Kearns, PT, DPT Acute Rehabilitation Services Pager [phone] Office [phone]

## 2019-01-04 NOTE — Progress Notes (Addendum)
NAME:  Randy [name], MRN:  [id number], DOB:  08/16/1945, LOS: 5 ADMISSION DATE:  12/20/2018, CONSULTATION DATE: June 17 REFERRING MD: Dr. Rory Percy, CHIEF COMPLAINT: Intraparenchymal hemorrhage  Brief History   73 year old male anticoagulated with Xarelto presented with intraparenchymal hematoma and was emergently taken to the OR for evacuation.  Past Medical History  Tobacco Abuse , PE , PAF - on Xarelto, NSTEMI , HTN ,ETOH Abuse   Significant Hospital Events   6/17 Admit with cerebellar ICH. To OR for evacuation. Out to ICU intubated.  6/17->6/22: extubated 6/18, had mild hydrocephalus, increased edema, decadron taper,  started on hypertonic saline, agitated started on precedex, on cleviprex to keep sbp < 160. 6/21 decreased LOC, intubated for airway protection, CT head w/ obstructive hydrocephalus. Right frontal IVC placed. Episodes of bradycardia. New right sided SDH felt 2/2 ICV placement. Went to OR again for right crani and placement of subdural drain.   Consults:  Neurosurgery PCCM  Procedures:  ETT 6/17 > 6/18 ETT 6/21 >>  Art line 6/17 > 6/18 Ventriculostomy 6/21 >>   Significant Diagnostic Tests:  CT head 6/17 > Acute intraparenchymal hemorrhage within the left cerebellum with volume of 28 mL and intraventricular extension to the third and fourth ventricles. 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 CTA head 6/17 > Severely motion degraded study. No proximal large vessel occlusion of the anterior circulation. The hemorrhage pattern of the left cerebellum is most consistent with hypertensive hemorrhage. ECHO 6/18 > severe akinesis of the left ventricular, mid-apical apical segment and lateral wall, LVEF ~ 50-55%, No RWMA, normal RV systolic function, moderate MV regurgitation CT head 6/22: 1. Status post right pterional craniotomy for decompression of right-sided subdural hematoma. 2.  Resolution of midline shift and ventricular entrapment. Markedly decreased size of subdural hematoma over the right convexity. 3. Unchanged appearance of left cerebellar hemorrhage of actuation cavity. Unchanged volume of blood over the left convexity. 4. Moderate pneumocephalus. Micro Data:  SARS-CoV-2 6/17 > neg  Antimicrobials:  Periop cefazolin 6/17  Interim history/subjective:  Sedated over night 2/2 increased BP. Neuro exam a little worse   Objective   Blood pressure 135/70, pulse (Abnormal) 45, temperature 99 F (37.2 C), resp. rate 14, height 5\' 4"  (1.626 m), weight 68.1 kg, SpO2 99 %.    Vent Mode: PRVC FiO2 (%):  [40 %-50 %] 40 % Set Rate:  [12 bmp] 12 bmp Vt Set:  [470 mL] 470 mL PEEP:  [5 cmH20] 5 cmH20 Plateau Pressure:  [11 cmH20-16 cmH20] 16 cmH20   Intake/Output Summary (Last 24 hours) at 01/04/2019 0830 Last data filed at 01/04/2019 0700 Gross per 24 hour  Intake 2257.43 ml  Output 1605 ml  Net 652.43 ml   Filed Weights   12/17/2018 1352 01/11/2019 2245  Weight: 68 kg 68.1 kg    Examination: General 73 year old male. Sedated on propofol gtt HENT NCAT orally intubated. IVC w/ pink tinged CSF, SDH drain on right w/ bloody output. Pupils pinpoint Pulm diffuse rhonchi. Decreased Right base Card RRR no MRG currently freq PVC abd soft + bowel sounds Neuro localizes on right left sided hemiparesis  GU clear yellow  Ext no edema brisk CR  Resolved Hospital Problem list     Assessment & Plan:   Cerebellar Intraparenchymal Hemorrhage, evacuated 6/17 Acute progression of hydrocephalus 6/21 requiring ventriculostomy placement Acute right subdural hematoma s/p evacuation 6/21 Localizes w/ right Plan Serial neuro checks Cont decadron Per neuro/neuro-surg  3% saline for cerebral edema->goal 150-155 SBP goal < 160  Acute respiratory failure, VDRF -in setting of IPH, SDH pcxr ett good position. Left basilar volume loss.  Plan Full vent support PAD protocol;  change RASS 0 to -1 VAP bundle  Daily assessment for SBT although currently MS does not support extubation trial   Atrial fibrillation on Xarelto -currently sinus on tele. Coagulopathy reversed with [facility] in ED.  Hypertension Plan Holding AC d/t head bleed Restart cozaar and lopressor Cont PRN hydralazine and will leave cleviprex on MAR if needed Goal < 160  Fluid and electrolyte imbalance: Hypernatremia (therapeutic), NAG Acidosis 2/2 hyperchloremia, borderline hypokalemia plan Replace potassium Hypertonic saline protocol goal 150-155 Serial chemistries  Anemia  hgb stable; PLTs stable Plan scds given recent bleeding Trend cbc Transfuse for hgb < 7  Hyperglycemia  persists Plan Cont ssi; add low dose q 4 basal   History of ETOH Abuse  -remote and he has not used for about 10 years: LFT normal on arrival  Plan Supportive care    Best practice:  Diet: NPO Pain/Anxiety/Delirium protocol (if indicated): Propofol VAP protocol (if indicated): 6/21 DVT prophylaxis: SCD GI prophylaxis: PPI Glucose control: SSI Mobility: BR Code Status: FULL Family Communication: Neurology and NSGY have discussed w family 6/21 Disposition:  Remains critically ill. Require CCM service for BP control, titration of mechanical ventilation and titration and management of fluid, electrolyte and acid base derangements.remains hemiparetic on left. Not ready for extubation. Will see how he looks off propofol. Goal today: RASS 0, BP <160, added back lopressor, cont supportive care. Reviewed notes from stroke service. Understand family would not want to prolong care if prognosis poor. Will eval over next couple days to see progress.   Critical care time: 32 min.    Erick Colace ACNP-BC [place] Pager # [phone] OR # [phone] if no answer  Attending Note:  73 year old male presenting with ICH while on xarelto that was taken to the OR for evacuation then subsequently had  a drain placed for worsening neuro exam.  Overnight, no further events.  On exam, he is moving the right side but not left spontaneous.  I reviewed CXR myself, ETT is in a good position.  Discussed with neuro and PCCM-NP.  Will change to PCV given dyssynchrony.  Adjust vent for ABG.  F/U CXR and ABG in AM.  Will allow for one more day, if not improvement per neuro in AM then will need to discuss trach vs comfort.  If trach then will need PEG and likely to progress to TC very quickly.  PCCM will continue to follow.  The patient is critically ill with multiple organ systems failure and requires high complexity decision making for assessment and support, frequent evaluation and titration of therapies, application of advanced monitoring technologies and extensive interpretation of multiple databases.   Critical Care Time devoted to patient care services described in this note is  32  Minutes. This time reflects time of care of this signee Dr Jennet Maduro. This critical care time does not reflect procedure time, or teaching time or supervisory time of PA/NP/Med student/Med Resident etc but could involve care discussion time.  Rush Farmer, M.D. [facility] Pulmonary/Critical Care Medicine. Pager: [phone]. After hours pager: [phone].

## 2019-01-04 NOTE — Progress Notes (Signed)
Postop day 1 from craniotomy for subdural hematoma.  Patient on ventilator.  He will open his eyes to stimulation.  He appears somewhat aware.  He is moving his right upper extremity strongly and purposefully.  He is beginning to move his left upper extremity more.  He certainly has some hemiparesis.  Pupils are 3 mm and equal bilaterally.  Right a little bit more sluggish than the left.  Follow-up head CT scan demonstrates resolution of his large right-sided subdural hematoma.  Ventriculostomy catheter well-positioned.  It is functioning but has not required any drainage over the last 8 hours.  Subdural drain with some bloody drainage.  Output is low.  Patient doing about as well as could be hoped for.  He does not appear to have suffered any major new ischemic insults.  Continue supportive care.

## 2019-01-04 NOTE — Progress Notes (Signed)
Occupational Therapy Discharge Patient Details Name: Randy [name] MRN: [id number] DOB: 1946-06-09 Today's Date: 01/04/2019 Time:  -     Patient discharged from OT services secondary to medical decline - will need to re-order OT to resume therapy services.  Please see latest therapy progress note for current level of functioning and progress toward goals.    Progress and discharge plan discussed with patient and/or caregiver: Pt with series of events in the last 12 hours resulting in re-intubation, worsening mental status, new hydrocephalus and Rt SDH s/p emergent crani and drain placement. Not appropriate for OT at this time. Will sign off for now per RN. Thanks      Richelle Ito, OTR/L  Acute Rehabilitation Services Pager: [phone] Office: [phone] .  01/04/2019, 1:37 PM

## 2019-01-04 NOTE — Progress Notes (Signed)
Patient transported to CT and back to 5H84 with no complications. Vitals remained stable.

## 2019-01-04 NOTE — Progress Notes (Signed)
STROKE TEAM PROGRESS NOTE   INTERVAL HISTORY Patient remains intubated and on ventilatory support.  He is unresponsive.  He is beginning to move the right side a little bit on stimulation but not moving the left side as well.  Blood pressure adequately controlled.  Follow-up CT scan this morning shows satisfactory evacuation of right subdural with good position of the ventriculostomy catheter but catheter output is low.  Vitals:   01/04/19 1600 01/04/19 1630 01/04/19 1700 01/04/19 1730  BP: 119/71 138/63 (!) 127/55 (!) 137/58  Pulse: (!) 48 76 (!) 58 68  Resp: 15 (!) 26 17 19   Temp: 99.5 F (37.5 C) 99.7 F (37.6 C) 99.3 F (37.4 C) 99.3 F (37.4 C)  TempSrc:      SpO2: 97% 98% 97% 97%  Weight:      Height:        CBC:  Recent Labs  Lab 01/09/2019 0545 01/09/2019 0931 01/04/19 0518  WBC 14.1*  --  11.1*  HGB 11.8* 9.5* 9.9*  HCT 36.3* 28.0* 31.0*  MCV 92.8  --  94.2  PLT 149*  --  128*    Basic Metabolic Panel:  Recent Labs  Lab 01/07/2019 0545 12/26/2018 0931  01/04/19 0518 01/04/19 1303  NA 156* 155*   < > 151* 154*  K 3.9 3.9  --  3.5  --   CL 125*  --   --  121*  --   CO2 22  --   --  20*  --   GLUCOSE 179*  --   --  162*  --   BUN 40*  --   --  53*  --   CREATININE 1.10  --   --  1.15  --   CALCIUM 9.5  --   --  8.3*  --   MG  --   --   --  2.5*  --    < > = values in this interval not displayed.   Lipid Panel:     Component Value Date/Time   CHOL 126 01/12/2019 1803   CHOL 123 08/18/2018 0947   TRIG 138 01/04/2019 0518   HDL 58 12/21/2018 1803   HDL 68 08/18/2018 0947   CHOLHDL 2.2 12/25/2018 1803   VLDL 20 12/21/2018 1803   LDLCALC 48 12/29/2018 1803   LDLCALC 43 08/18/2018 0947   HgbA1c:  Lab Results  Component Value Date   HGBA1C 6.1 (H) 12/29/2018   Urine Drug Screen:     Component Value Date/Time   LABOPIA NONE DETECTED 12/19/2018 2301   COCAINSCRNUR NONE DETECTED 12/22/2018 2301   LABBENZ NONE DETECTED 01/11/2019 2301   AMPHETMU NONE  DETECTED 12/23/2018 2301   THCU NONE DETECTED 12/14/2018 2301   LABBARB NONE DETECTED 12/24/2018 2301    Alcohol Level     Component Value Date/Time   ETH <10 06/29/2018 1035    IMAGING  Ct Head Wo Contrast 01/01/2019 IMPRESSION:  1. Stable volume of intracranial hemorrhage that is mainly intraventricular.  2. Interval normalization of lateral ventricular volume.    Dg Chest Port 1 View 01/01/2019 IMPRESSION:  Enlargement of cardiac silhouette post CABG. Atelectasis versus consolidation LEFT lower lobe increased since previous exam.   Ct Head Wo Contrast 01/07/2019 - 4:30 AM IMPRESSION: 1. Interval worsening of lateral and third ventricle obstructive hydrocephalus. 2. Stable left cerebellum hematoma evacuation cavity and small volume of intraventricular hemorrhage.   Ct Head Wo Contrast 01/02/2019 - 7:15 AM IMPRESSION: 1. Interval development of large acute  subdural hematoma on the right measuring up to 19 mm in thickness. 18 mm midline shift to the left. 2. Interval ventricular drain placement with decompression of the ventricles 3. Postop left occipital craniotomy for hematoma evacuation in the left cerebellum. No recurrent hemorrhage in the left cerebellum. 4. These results were called by telephone at the time of interpretation on 12/30/2018 at 7:37 am to Dr. Trenton Gammon , who verbally acknowledged these results.  Repeat Ct Head Wo Contrast -01/04/2019 resolution of right-sided subdural hematoma and midline shift with postoperative changes.  Right ventricular catheter in optimal position.  Unchanged appearance of left cerebellar hematoma evacuation.    PHYSICAL EXAM  Temp:  [98.8 F (37.1 C)-101.7 F (38.7 C)] 99.3 F (37.4 C) (06/22 1730) Pulse Rate:  [40-97] 68 (06/22 1730) Resp:  [12-26] 19 (06/22 1730) BP: (101-164)/(50-88) 137/58 (06/22 1730) SpO2:  [90 %-100 %] 97 % (06/22 1730) Arterial Line BP: (116-185)/(49-73) 149/57 (06/22 1730) FiO2 (%):  [40 %-50 %] 40 % (06/22  1526)  General - Well nourished, well developed, elderly Caucasian male who is intubated sedated Ophthalmologic - fundi not visualized due to noncooperation.  Cardiovascular - Regular rate and rhythm.  Neuro -sedated intubated.  Eyes are closed.  Right pupil 4 mm not reactive.  Left 3 mm reactive.  Doll's eye movement absent.  Very weak cough and gag.  Semipurposeful flexion response in the right upper and lower extremity to painful stimuli and trace response in the left lower extremity.  No decorticate posturing   plantars not elicitable bilaterally.  ASSESSMENT/PLAN [name] is a 73 y.o. male with history of tobacco abuse, and pulmonary embolus, paroxysmal atrial fibrillation, hypertension, alcohol abuse with history of coagulopathy in setting of Xarelto from prior alcoholism presenting with dizziness, diaphoresis and bradycardia. Initially felt to be cardiac but cerebellar bleed found on imaging.   Hemorrhage: Left cerebellar ICH with IVH on Xarelto and aspirin, s/p [facility] reversal and suboccipital hematoma evacuation  Neurosurgery consult Dr. Ronnald Ramp, post suboccipital Craniectomy 12/14/2018.Marland Kitchen Neurological worsening due to hydrocephalus 01/02/2019 requiring emergent ventriculostomy followed by further neurological worsening due to acute right subdural hematoma requiring emergent right craniotomy for hematoma evacuation  CT head 6/17 left cerebellar hemorrhage 28 mils with IVH.  Posterior fossa without hydrocephalus  CTA head motion degraded.  No gross AVM or aneurysm  CT head 6/18 L suboccipital craniotomy, evacuation L cerebellar hemorrhage.  Decreased mass-effect on fourth ventricle and posterior fossa.  IVH bilaterally.  Minimal pneumocephalus.  Atherosclerosis.  CT repeat stable hematoma s/p evacuation and IVH, no hydrocephalus but right hemisphere developing hygroma  CT Head - 01/01/2019 - Stable volume of intracranial hemorrhage that is mainly intraventricular.   MRI to rule out  hemorrhagic infarct once able to lay flat longer   See above for Head CTs from this morning  2D Echo EF 50 to 55%  LDL 48  HgbA1c 6.1  SCDs for VTE prophylaxis  aspirin 81 mg daily and Xarelto (rivaroxaban) daily prior to admission, now on No antithrombotic given hemorrhage.   Therapy recommendations:  CIR recommended  Disposition:  pending   Cerebral Edema - Induced Hypernatremia  3% saline @ 50 cc/hr -> ? off  Also on dexamethasone per neurosurgery  Na 136->138->144->147->144->154->156->153  Goal Na 150-155  Check Na q 6h  Keep foley  CT repeat stable hematoma s/p evacuation and IVH, no hydrocephalus but right hemisphere developing hygroma  CT Head - 01/01/2019 - Stable volume of intracranial hemorrhage that is mainly intraventricular.  Hydrocephalus and right hemisphere hygroma   Repeat CT 12/31/2018 showed mild developing hydrocephalus  At this time no indication for EVD  CT repeat stable hematoma s/p evacuation and IVH, no hydrocephalus but right hemisphere developing hygroma  CT Head - 01/01/2019 - Stable volume of intracranial hemorrhage that is mainly intraventricular.   Hygroma on the right will need close monitoring  Acute Hypoxic Respiratory failure   Secondary to stroke   Intubated in ED   Extubated 12/31/18  Tolerating well  Continue to have copious secretions  On 3% neb Q6h while awake  NT suction  Post CABG Atrial Fibrillation  CABG 06/2018  Home anticoagulation:  Xarelto (rivaroxaban) daily   Reversed with [facility]  Hypertensive emergency  Home meds: Cozaar 100, Toprol 50  Systolic blood pressure greater than 200s with hemorrhage   Treated with Cleviprex   Currently stable  SBP goal <160   Restlessness  Developed restlessness after extubation  Put on Precedex, now off  Close monitoring  Hyperlipidemia  Home meds: Lipitor 80  LDL 48, at goal < 70  Hold statin given hemorrhage  Consider continuation of statin  lower dose at discharge  Dysphagia   Secondary to stroke   Currently n.p.o.   Speech following  Cortrak placed 01/01/2019  Now on tube feeds  BP home meds: Cozaar 100, Toprol 50 -> resumed  Leukocytosis   WBC 14.8->12.5->15.8->16.2 (on Decadron)->14.1  TMax 100.2->afebrile->T max 100.1->99.7  UA - 01/01/2019 - normal  CXR - 01/01/2019 - Atelectasis versus consolidation LEFT lower lobe increased since previous exam.   CXR - 01/02/2019 - Endotracheal tube tip projects 4.6 cm above the carina. Stable left basilar consolidation.   Neurologic Change and Decompensation - Obstructive hydrocephalus.    Right frontal ventriculostomy - Dr Annette Stable 5:30 this AM  Dr Cheral Marker saw pt at 6:30 AM for further neurologic change and posturing  Head CT - subdural hematoma  Dr Annette Stable took pt back to OR for a craniotomy and evacuation of subdural hematoma   Pt's condition remains tenuous - intubated  Follow up CT pending  CCM and NS have updated family   Other Stroke Risk Factors  Advanced age  Former smoker, quit smoking 6 months ago  History of alcohol abuse, last drink 10 years ago  Coronary artery disease, NSTEMI s/p CABG, December 2019  Other Active Problems  Mildly elevated troponin 0.05, reactionary  Hospital day # 5 Plan :His neurological exam remains quite poor though somewhat improved post surgery and prognosis is quite grim.  Continue ventilatory support and strict control of blood pressure. Long discussion with Dr. Nelda Marseille critical care medicine. Hopefully will see greater neurological improvement over the next few days..  Family is quite realistic and feel he would not want prolonged life support.  They agreed to support him for a few days to see if he makes any meaningful recovery L stable consider one-way extubation and comfort care. This patient is critically ill due to cerebellar hemorrhage, status post surgical evacuation, cerebral edema, hypertensive emergency, A. fib  on anticoagulation and at significant risk of neurological worsening, death form hematoma expansion, brain herniation, heart failure, seizure. This patient's care requires constant monitoring of vital signs, hemodynamics, respiratory and cardiac monitoring, review of multiple databases, neurological assessment, discussion with family, other specialists and medical decision making of high complexity. I spent 30 minutes of neurocritical care time in the care of this patient.   Antony Contras, MD  To contact Stroke Continuity provider, please refer to [email or web address] After  hours, contact General Neurology

## 2019-01-04 NOTE — Progress Notes (Signed)
Pt dyssynchronous with vent despite RASS -4; Propofol titrated for vent synchrony. Spontaneous movement of the right extremities, left side withdraws to pain.

## 2019-01-04 NOTE — Progress Notes (Signed)
[place] Progress Note Patient Name: Randy [name] DOB: 1946-04-16 MRN: [id number]   Date of Service  01/04/2019  HPI/Events of Note  Notified of bradycardia into the 40s transiently. He is now s/p hematoma evacuation.  On video assessment, BP 139/71, HR 50s-70s.  eICU Interventions  Discontinue metoprolol.   Continue to monitor for now.     Intervention Category Intermediate Interventions: Arrhythmia - evaluation and management  Elsie Lincoln 01/04/2019, 12:02 AM

## 2019-01-04 NOTE — Progress Notes (Signed)
[place] Progress Note Patient Name: Randy [name] DOB: [birth year] MRN: [id number]   Date of Service  01/04/2019  HPI/Events of Note  Notified that patient was trying to pull at the ETT.   eICU Interventions  Wrist restraints on the right ordered.     Intervention Category Minor Interventions: Other:  Elsie Lincoln 01/04/2019, 11:18 PM

## 2019-01-04 NOTE — Progress Notes (Signed)
Patient ID: Randy [name], male   DOB: [birth year], 73 y.o.   MRN: [id number] Events of the weekend noted.  Patient is intubated and sedated.  Dressings are dry.  I reviewed his postoperative CT scan.  Excellent decompression of right subdural hematoma.  Ventriculostomy in place.  No hydrocephalus.  Cerebellar resection still looks good.  Continue current management.

## 2019-01-05 ENCOUNTER — Inpatient Hospital Stay (HOSPITAL_COMMUNITY): Payer: Medicare Other

## 2019-01-05 DIAGNOSIS — R569 Unspecified convulsions: Secondary | ICD-10-CM

## 2019-01-05 LAB — GLUCOSE, CAPILLARY
Glucose-Capillary: 170 mg/dL — ABNORMAL HIGH (ref 70–99)
Glucose-Capillary: 153 mg/dL — ABNORMAL HIGH (ref 70–99)
Glucose-Capillary: 229 mg/dL — ABNORMAL HIGH (ref 70–99)
Glucose-Capillary: 154 mg/dL — ABNORMAL HIGH (ref 70–99)
Glucose-Capillary: 168 mg/dL — ABNORMAL HIGH (ref 70–99)

## 2019-01-05 LAB — COMPREHENSIVE METABOLIC PANEL
ALT: 32 U/L (ref 0–44)
AST: 40 U/L (ref 15–41)
Albumin: 2.8 g/dL — ABNORMAL LOW (ref 3.5–5.0)
Alkaline Phosphatase: 32 U/L — ABNORMAL LOW (ref 38–126)
Anion gap: 9 (ref 5–15)
BUN: 63 mg/dL — ABNORMAL HIGH (ref 8–23)
CO2: 21 mmol/L — ABNORMAL LOW (ref 22–32)
Calcium: 8.2 mg/dL — ABNORMAL LOW (ref 8.9–10.3)
Chloride: 125 mmol/L — ABNORMAL HIGH (ref 98–111)
Creatinine, Ser: 1.17 mg/dL (ref 0.61–1.24)
GFR calc Af Amer: >60 mL/min (ref >60)
GFR calc non Af Amer: >60 mL/min (ref >60)
Glucose, Bld: 159 mg/dL — ABNORMAL HIGH (ref 70–99)
Potassium: 3.7 mmol/L (ref 3.5–5.1)
Sodium: 155 mmol/L — ABNORMAL HIGH (ref 135–145)
Total Bilirubin: 0.6 mg/dL (ref 0.3–1.2)
Total Protein: 5.6 g/dL — ABNORMAL LOW (ref 6.5–8.1)

## 2019-01-05 LAB — CBC
HCT: 33.8 % — ABNORMAL LOW (ref 39.0–52.0)
Hemoglobin: 10.8 g/dL — ABNORMAL LOW (ref 13.0–17.0)
MCH: 29.9 pg (ref 26.0–34.0)
MCHC: 32 g/dL (ref 30.0–36.0)
MCV: 93.6 fL (ref 80.0–100.0)
Platelets: 147 10*3/uL — ABNORMAL LOW (ref 150–400)
RBC: 3.61 MIL/uL — ABNORMAL LOW (ref 4.22–5.81)
RDW: 15.5 % (ref 11.5–15.5)
WBC: 15 10*3/uL — ABNORMAL HIGH (ref 4.0–10.5)
nRBC: 0.1 % (ref 0.0–0.2)

## 2019-01-05 LAB — POCT I-STAT 7, (LYTES, BLD GAS, ICA,H+H)
Acid-base deficit: 3 mmol/L — ABNORMAL HIGH (ref 0.0–2.0)
Bicarbonate: 20.9 mmol/L (ref 20.0–28.0)
Calcium, Ion: 1.21 mmol/L (ref 1.15–1.40)
HCT: 32 % — ABNORMAL LOW (ref 39.0–52.0)
Hemoglobin: 10.9 g/dL — ABNORMAL LOW (ref 13.0–17.0)
O2 Saturation: 96 %
Potassium: 3.6 mmol/L (ref 3.5–5.1)
Sodium: 156 mmol/L — ABNORMAL HIGH (ref 135–145)
TCO2: 22 mmol/L (ref 22–32)
pCO2 arterial: 31.2 mmHg — ABNORMAL LOW (ref 32.0–48.0)
pH, Arterial: 7.434 (ref 7.350–7.450)
pO2, Arterial: 77 mmHg — ABNORMAL LOW (ref 83.0–108.0)

## 2019-01-05 LAB — PHOSPHORUS: Phosphorus: 2.9 mg/dL (ref 2.5–4.6)

## 2019-01-05 LAB — TRIGLYCERIDES: Triglycerides: 109 mg/dL (ref <150)

## 2019-01-05 LAB — SODIUM: Sodium: 152 mmol/L — ABNORMAL HIGH (ref 135–145)

## 2019-01-05 LAB — MAGNESIUM: Magnesium: 2.7 mg/dL — ABNORMAL HIGH (ref 1.7–2.4)

## 2019-01-05 MED ORDER — CHLORHEXIDINE GLUCONATE CLOTH 2 % EX PADS
6.0000 | MEDICATED_PAD | Freq: Every day | CUTANEOUS | Status: DC
  Administered 2019-01-05 – 2019-01-09 (×5): 6 via TOPICAL

## 2019-01-05 MED ORDER — POTASSIUM CHLORIDE 20 MEQ/15ML (10%) PO SOLN
20.0000 meq | Freq: Once | ORAL | Status: AC
  Administered 2019-01-05: 20 meq
  Filled 2019-01-05: qty 15

## 2019-01-05 MED ORDER — LEVETIRACETAM IN NACL 1000 MG/100ML IV SOLN
1000.0000 mg | Freq: Once | INTRAVENOUS | Status: AC
  Administered 2019-01-05: 1000 mg via INTRAVENOUS
  Filled 2019-01-05: qty 100

## 2019-01-05 MED ORDER — HYDRALAZINE HCL 20 MG/ML IJ SOLN
20.0000 mg | Freq: Four times a day (QID) | INTRAMUSCULAR | Status: DC | PRN
  Administered 2019-01-05 – 2019-01-06 (×3): 20 mg via INTRAVENOUS
  Filled 2019-01-05 (×3): qty 1

## 2019-01-05 MED ORDER — MIDAZOLAM HCL 2 MG/2ML IJ SOLN
1.0000 mg | Freq: Once | INTRAMUSCULAR | Status: AC
  Administered 2019-01-05: 1 mg via INTRAVENOUS
  Filled 2019-01-05: qty 2

## 2019-01-05 MED ORDER — LEVETIRACETAM IN NACL 500 MG/100ML IV SOLN
500.0000 mg | Freq: Two times a day (BID) | INTRAVENOUS | Status: DC
  Administered 2019-01-05: 500 mg via INTRAVENOUS
  Filled 2019-01-05 (×2): qty 100

## 2019-01-05 MED ORDER — FREE WATER
300.0000 mL | Freq: Four times a day (QID) | Status: DC
  Administered 2019-01-05 – 2019-01-07 (×8): 300 mL

## 2019-01-05 MED ORDER — AMLODIPINE 1 MG/ML ORAL SUSPENSION
2.5000 mg | Freq: Every day | ORAL | Status: DC
  Administered 2019-01-05 – 2019-01-07 (×3): 2.5 mg
  Filled 2019-01-05 (×7): qty 2.5

## 2019-01-05 MED ORDER — VALPROATE SODIUM 500 MG/5ML IV SOLN
1500.0000 mg | Freq: Once | INTRAVENOUS | Status: AC
  Administered 2019-01-05: 1500 mg via INTRAVENOUS
  Filled 2019-01-05: qty 15

## 2019-01-05 MED ORDER — VALPROATE SODIUM 500 MG/5ML IV SOLN
500.0000 mg | Freq: Three times a day (TID) | INTRAVENOUS | Status: DC
  Administered 2019-01-05 – 2019-01-06 (×2): 500 mg via INTRAVENOUS
  Filled 2019-01-05 (×4): qty 5

## 2019-01-05 NOTE — Progress Notes (Signed)
Providing Compassionate, Quality Care - Together   Subjective: Nursing staff report no issues overnight. Patient able to follow commands on assessment this morning.  Objective: Vital signs in last 24 hours: Temp:  [98.8 F (37.1 C)-100.2 F (37.9 C)] 99.3 F (37.4 C) (06/23 0700) Pulse Rate:  [47-95] 94 (06/23 0729) Resp:  [13-26] 21 (06/23 0729) BP: (101-167)/(50-97) 167/73 (06/23 0729) SpO2:  [91 %-99 %] 97 % (06/23 0729) Arterial Line BP: (121-201)/(48-111) 156/59 (06/23 0700) FiO2 (%):  [40 %] 40 % (06/23 0729)  Intake/Output from previous day: 06/22 0701 - 06/23 0700 In: 2123.9 [I.V.:538; NG/GT:1285.9; IV Piggyback:300] Out: 1887 [Urine:1725; Drains:162] Intake/Output this shift: No intake/output data recorded.  Patient intubated Upon assessment, patient noted to have right eye twitching and gaze deviation to the right. PERRLA Purposeful RUE EVD in place and clamped Incision dry and intact, old drainage   Lab Results: Recent Labs    01/04/19 0518 01/05/19 0428 01/05/19 0550  WBC 11.1*  --  15.0*  HGB 9.9* 10.9* 10.8*  HCT 31.0* 32.0* 33.8*  PLT 128*  --  147*   BMET Recent Labs    01/04/19 0518  01/05/19 0428 01/05/19 0550  NA 151*   < > 156* 155*  K 3.5  --  3.6 3.7  CL 121*  --   --  125*  CO2 20*  --   --  21*  GLUCOSE 162*  --   --  159*  BUN 53*  --   --  63*  CREATININE 1.15  --   --  1.17  CALCIUM 8.3*  --   --  8.2*   < > = values in this interval not displayed.    Studies/Results: Ct Head Wo Contrast  Result Date: 01/04/2019 CLINICAL DATA:  Subdural hemorrhage follow-up EXAM: CT HEAD WITHOUT CONTRAST TECHNIQUE: Contiguous axial images were obtained from the base of the skull through the vertex without intravenous contrast. COMPARISON:  01/04/2019 FINDINGS: Brain: Status post decompressive craniotomy for subdural hematoma. There is a subdural drainage catheter within the right subdural space. Unchanged position of right frontal  approach shunt catheter. There is a large amount of pneumocephalus, predominantly over the frontal lobes. The distribution of intraventricular blood is unchanged. Left cerebellum hematoma evacuation cavity is unchanged. Ventricular obstruction has resolved. Decreased dilatation of the left lateral ventricle. Midline shift has resolved. Amount of blood within the left subdural space is unchanged. Vascular: Atherosclerotic calcification of the vertebral and internal carotid arteries at the skull base. No abnormal hyperdensity of the major intracranial arteries or dural venous sinuses. A Skull: Interval right pterional craniotomy. Sinuses/Orbits: No fluid levels or advanced mucosal thickening of the visualized paranasal sinuses. No mastoid or middle ear effusion. The orbits are normal. IMPRESSION: 1. Status post right pterional craniotomy for decompression of right-sided subdural hematoma. 2. Resolution of midline shift and ventricular entrapment. Markedly decreased size of subdural hematoma over the right convexity. 3. Unchanged appearance of left cerebellar hemorrhage of actuation cavity. Unchanged volume of blood over the left convexity. 4. Moderate pneumocephalus. Electronically Signed   By: Ulyses Jarred M.D.   On: 01/04/2019 03:44   Dg Chest Port 1 View  Result Date: 01/05/2019 CLINICAL DATA:  Endotracheal tube EXAM: PORTABLE CHEST 1 VIEW COMPARISON:  Yesterday FINDINGS: Endotracheal tube tip just below the clavicular heads. Feeding tube that reaches the stomach. 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 Improved left lower lobe aeration. There is still mild atelectasis at the bases. No edema or pneumothorax. IMPRESSION: 1. Stable hardware  positioning. 2. Improved left base aeration. Electronically Signed   By: Monte Fantasia M.D.   On: 01/05/2019 08:59   Dg Chest Port 1 View  Result Date: 01/04/2019 CLINICAL DATA:  Acute respiratory failure with hypoxia. EXAM: PORTABLE CHEST 1 VIEW COMPARISON:  Radiograph of January 03, 2019. FINDINGS: Stable position of endotracheal and feeding tubes. Status post coronary artery bypass graft. No pneumothorax is noted. Right lung is clear. Stable left basilar atelectasis or infiltrate is noted with associated pleural effusion is noted. Bony thorax is unremarkable. IMPRESSION: Stable support apparatus. Stable left basilar opacity as described above Electronically Signed   By: Marijo Conception M.D.   On: 01/04/2019 07:44    Assessment/Plan: Patient is 6 days s/p craniotomy for evacuation of large left cerebellar hemorrhage by Dr. Ronnald Ramp. He developed hydrocephalus and an EVD was placed on 12/14/2018. SDH hematoma developed and a craniectomy was performed 12/20/2018 for evacuation by Dr. Annette Stable. Patient able to follow commands this AM. Focal SZ activity noted 01/05/2019.   LOS: 6 days    -EVD clamped earlier today with ICP monitoring -Focal SZ activity noted at 0929, EEG ordered by Dr. Leonie Man and versed given -CT head scheduled for tomorrow   Viona Gilmore, DNP, AGNP-C Nurse Practitioner  [facility] [place]. [address], [place], [place] [address] P: [phone]    F: [phone]  01/05/2019, 9:44 AM

## 2019-01-05 NOTE — Procedures (Signed)
EEG Report  Clinical History: Altered mental status, left cerebellar ICH with IVH and  Hydrocephalus s/p right EVD.  Stat EEG.  Technical Summary:  A 19 channel digital EEG recording was performed using the 10-20 international system of electrode placement.  Bipolar and Referential montages were used.  The total recording time was approx 20 minutes.  Findings:  There is no posterior dominant rhythm.  Background frequencies are in the 5-6 Hz range.  There is an epileptiform discharge recurrently and intermittently with maximal negativity at C4.  As the recording proceeds, the discharges evolve into a rhythmic theta frequency electrographic seizure in the right frontocentral area with some spread to the right temporal area.      Impression:  This is an abnormal EEG.  There is evidence of a seizure tendency emanating from the right central area of the brain with an electrographic seizure emanating from the right frontocentral area of the brain.    Rogue Jury, MS, MD

## 2019-01-05 NOTE — Progress Notes (Signed)
Speech Language Pathology Discharge Patient Details Name: Copelan Maultsby MRN: [id number] DOB: 10/27/1945 Today's Date: 01/05/2019 Time:  -     Patient discharged from SLP services secondary to medical decline - will need to re-order SLP to resume therapy services. Intubated. Please reconsult if needed  Please see latest therapy progress note for current level of functioning and progress toward goals.    Progress and discharge plan discussed with patient and/or caregiver: Patient unable to participate in discharge planning and no caregivers available  GO     Houston Siren 01/05/2019, 7:47 AM   Orbie Pyo Colvin Caroli.Ed Risk analyst [phone] Office [phone]

## 2019-01-05 NOTE — Progress Notes (Addendum)
NAME:  Randy [name], MRN:  [id number], DOB:  1946/03/06, LOS: 6 ADMISSION DATE:  12/19/2018, CONSULTATION DATE: June 17 REFERRING MD: Dr. Rory Percy, CHIEF COMPLAINT: Intraparenchymal hemorrhage  Brief History   73 year old male anticoagulated with Xarelto presented with intraparenchymal hematoma and was emergently taken to the OR for evacuation.  Past Medical History  Tobacco Abuse , PE , PAF - on Xarelto, NSTEMI , HTN ,ETOH Abuse   Significant Hospital Events   6/17 Admit with cerebellar ICH. To OR for evacuation. Out to ICU intubated.  6/17->6/22: extubated 6/18, had mild hydrocephalus, increased edema, decadron taper,  started on hypertonic saline, agitated started on precedex, on cleviprex to keep sbp < 160. 6/21 decreased LOC, intubated for airway protection, CT head w/ obstructive hydrocephalus. Right frontal IVC placed. Episodes of bradycardia. New right sided SDH felt 2/2 ICV placement. Went to OR again for right crani and placement of subdural drain.  6/22: Subdural drain removed, Seizure-like activity, EEG started    Consults:  Neurosurgery PCCM  Procedures:  ETT 6/17 > 6/18 ETT 6/21 >>  Art line 6/17 > 6/18 Ventriculostomy 6/21 >>   Significant Diagnostic Tests:  CT head 6/17 > Acute intraparenchymal hemorrhage within the left cerebellum with volume of 28 mL and intraventricular extension to the third and fourth ventricles. 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 CTA head 6/17 > Severely motion degraded study. No proximal large vessel occlusion of the anterior circulation. The hemorrhage pattern of the left cerebellum is most consistent with hypertensive hemorrhage. ECHO 6/18 > severe akinesis of the left ventricular, mid-apical apical segment and lateral wall, LVEF ~ 50-55%, No RWMA, normal RV systolic function, moderate MV regurgitation CT head 6/22: 1. Status post right pterional  craniotomy for decompression of right-sided subdural hematoma. 2. Resolution of midline shift and ventricular entrapment. Markedly decreased size of subdural hematoma over the right convexity. 3. Unchanged appearance of left cerebellar hemorrhage of actuation cavity. Unchanged volume of blood over the left convexity. 4. Moderate pneumocephalus. Micro Data:  SARS-CoV-2 6/17 > neg  Antimicrobials:  Periop cefazolin 6/17  Interim history/subjective:  Remains intubated, unable to purposely follow commands. Neuro exam slightly improved from yesterday. Able to localize pain in all four extremities  Objective   Blood pressure (Abnormal) 167/73, pulse 94, temperature 99.3 F (37.4 C), resp. rate (Abnormal) 21, height 5\' 4"  (1.626 m), weight 68.1 kg, SpO2 97 %.    Vent Mode: CPAP;PSV FiO2 (%):  [40 %] 40 % Set Rate:  [12 bmp-15 bmp] 15 bmp Vt Set:  [470 mL] 470 mL PEEP:  [5 cmH20] 5 cmH20 Pressure Support:  [5 cmH20] 5 cmH20 Plateau Pressure:  [12 cmH20-13 cmH20] 12 cmH20   Intake/Output Summary (Last 24 hours) at 01/05/2019 0819 Last data filed at 01/05/2019 0700 Gross per 24 hour  Intake 2077.71 ml  Output 1887 ml  Net 190.71 ml   Filed Weights   01/11/2019 1352 12/26/2018 2245  Weight: 68 kg 68.1 kg    Examination: General: 73 year old male. HENT: NCAT. IVC with pink-tinged CSF. ETT in place. Enteric tube in place Pulm: Intubated. Diminished throughout, tan-colored secretions Card: RRR. no murmur, rubs, or gallops. Occasional PVCs Abd: Soft, nontender. Present bowel sounds x4 Neuro: Localizes to pain on all four extremities, More purposeful on RUE and RLE. Still unable to follow commands   GU: Foley in place. Clear to yellow. Adequate urine output Ext: No edema, brisk capillary refill, staples approximated on  head  Resolved Hospital Problem list     Assessment & Plan:   Cerebellar Intraparenchymal Hemorrhage, evacuated 6/17 Acute progression of hydrocephalus 6/21 requiring  ventriculostomy placement Acute right subdural hematoma s/p evacuation 6/21 Localizes pain in all four extremities. Unable to purposely follow commands Plan Continue serial neuro checks  Per neuro, slowly maintain sodium to normal levels  Continue Decadron per Neuro-Surgery Maintain SBP < 160  Seizure-like activity Right eye twitching with right gaze deviation  Plan Versed 1 mg  Keppra started   EEG started  Acute respiratory failure, VDRF In the setting of IPH, SDH Improved PCXR - ETT good position. Left basilar volume loss improving.  Plan Pressure support ventilation PAD protocol; RASS goal 0 to -1 VAP bundle  Daily assessment for SBT, current mental status does not support extubation trial   Atrial fibrillation on Xarelto Currently sinus rhythm on telemetry. Coagulopathy reversed with [facility] in ED Plan Hold AC d/t head bleed   Hypertension Persists Plan Maintain SBP goal <160 Add Amlodipine 2.5 daily, Continue home meds: cozaar and lopressor PRN hydralazine 20 mg Q6h with goal to d/c cleviprex  Fluid and electrolyte imbalance:  Hypernatremia (therapeutic), NAG Acidosis 2/2 hyperchloremia, borderline hypokalemia Plan Per neuro, slowly maintain sodium to normal levels  Free water bolus started 300 ml Q6h Serial chem  Supplement Potassium   Anemia  Hgb stable, platelets stable Plan Continue to trend CBC  Transfuse for Hgb < 7 SCDs for DVT prophylaxis  Hyperglycemia  Persists Plan Continue SSI and low dose basal  History of ETOH Abuse  Remote history. Per patient, has not used for about 10 years. LFT's normal upon arrival Plan Supportive care    Best practice:  Diet: NPO Pain/Anxiety/Delirium protocol (if indicated): Propofol VAP protocol (if indicated): 6/21 DVT prophylaxis: SCD GI prophylaxis: PPI Glucose control: SSI Mobility: BR Code Status: FULL Family Communication: Neurology and NSGY have discussed w family 6/21 Disposition:  Remains  critically ill. Require CCM service for BP control, mechanical ventilation titration, and titration/ management of fluid, electrolyte, and acid-base imbalances. Neuro exam is improved with localizing pain throughout all four extremities. Intermittently unable to follow commands. Off of sedation, on pressure support ventilation. CXR is improved. SBP < 160 with cleviprex, cozaar, lopressor, and add amlodipine. Transient bradycardia over night in the 40's. Per stroke team's note, family is understanding and would not want to prolong care if prognosis is poor. Will continue to evaluate over the next few days.  Patient experienced seizure-like activity. Right eye and right foot twitching with right gaze deviation. Versed, Keppra and an EEG ordered per neuro    Critical care time:  32 min.    Erick Colace ACNP-BC [place] Pager # [phone] OR # [phone] if no answer  Attending Note:  73 year old male with PMH of HTN presenting with ICH that underwent a crani followed by IVD drainage placement.  No events overnight. This AM is opening eyes and weaning with clear lungs on exam.  I reviewed CXR myself, ETT is in a good position.  Discussed with neurology MD and PCCM-NP.   Acute respiratory failure:             - Not safe to extubate given neuro status, need to determine one way extubation (recommended) vs trach/peg             - If trach, will be off the vent very quickly             -  Adjust vent for ABG             - Titrate O2 for sat of 88-92%  HTN:             - Cleviprex             - Per neuro recommendations  CVA:             - Drain in place, managed by NS             - Daily physical exams to evaluate  PCCM will continue to follow  The patient is critically ill with multiple organ systems failure and requires high complexity decision making for assessment and support, frequent evaluation and titration of therapies, application of advanced monitoring  technologies and extensive interpretation of multiple databases.   Critical Care Time devoted to patient care services described in this note is  31  Minutes. This time reflects time of care of this signee Dr Jennet Maduro. This critical care time does not reflect procedure time, or teaching time or supervisory time of PA/NP/Med student/Med Resident etc but could involve care discussion time.  Rush Farmer, M.D. [facility] Pulmonary/Critical Care Medicine. Pager: [phone]. After hours pager: [phone].

## 2019-01-05 NOTE — Progress Notes (Signed)
Upon assessment by NP, noticed patient having focal sz in right eye. Order for 1 mg versed given to patient and intermittent EEG. Sz lasted from [id number] with right minimal eye twitching and possible right leg twitching. Based off of findings, 24 hr EEG ordered and patient given 1000 mg of keppra followed by q12 hr 500 mg keppra. Patient also given depakote. No further sz activity noted at this time. Will continue to monitor patient.

## 2019-01-05 NOTE — Progress Notes (Signed)
vLTM EEG running. Notified Neuro 

## 2019-01-05 NOTE — Progress Notes (Signed)
STROKE TEAM PROGRESS NOTE   INTERVAL HISTORY The patient initially evaluated by me during a.m. rounds was doing better and was following simple commands and able to move right upper and lower extremity purposefully to commands.  He remains plegic on the left.  Shortly after I saw him he is started developing focal twitchings of his right perioral regions, lips as well as right toes which he could not stop voluntarily even though he was awake.  He was given 1 mg of Versed and went to sleep and the twitching stopped.  Subsequently a bedside EEG was obtained which showed intermittent rhythmic sharp activity arising from the left brain.  He was then loaded with 1 g of Keppra but this persisted and hence IV Depacon 1.5 g was added.  Patient was kept on long-term EEG monitoring for 24 hours Neurosurgery team at earlier clamped the ventriculostomy catheter and had removed the subdural drain.  Plan to repeat CT head in a.m. Vitals:   01/05/19 0630 01/05/19 0645 01/05/19 0700 01/05/19 0729  BP: (!) 151/80  (!) 167/73 (!) 167/73  Pulse: 83 78 95 94  Resp: 16 17 19  (!) 21  Temp: 99.5 F (37.5 C) 99.3 F (37.4 C) 99.3 F (37.4 C)   TempSrc:      SpO2: 94% 97% 98% 97%  Weight:      Height:        CBC:  Recent Labs  Lab 01/04/19 0518 01/05/19 0428 01/05/19 0550  WBC 11.1*  --  15.0*  HGB 9.9* 10.9* 10.8*  HCT 31.0* 32.0* 33.8*  MCV 94.2  --  93.6  PLT 128*  --  147*    Basic Metabolic Panel:  Recent Labs  Lab 01/04/19 0518  01/05/19 0428 01/05/19 0550  NA 151*   < > 156* 155*  K 3.5  --  3.6 3.7  CL 121*  --   --  125*  CO2 20*  --   --  21*  GLUCOSE 162*  --   --  159*  BUN 53*  --   --  63*  CREATININE 1.15  --   --  1.17  CALCIUM 8.3*  --   --  8.2*  MG 2.5*  --   --  2.7*  PHOS  --   --   --  2.9   < > = values in this interval not displayed.   IMAGING  Repeat Ct Head Wo Contrast 01/04/2019  resolution of right-sided subdural hematoma and midline shift with postoperative  changes.  Right ventricular catheter in optimal position.  Unchanged appearance of left cerebellar hematoma evacuation.  Dg Chest Port 1 View 01/05/2019 1. Stable hardware positioning. 2. Improved left base aeration.   PHYSICAL EXAM General - Well nourished, well developed, elderly Caucasian male who is intubated sedated Ophthalmologic - fundi not visualized due to noncooperation.  Cardiovascular - Regular rate and rhythm.  Neuro -sedated intubated.  Eyes are closed.  Right pupil 4 mm not reactive.  Left 3 mm reactive.  Patient is able to follow simple commands and midline as well as right upper and lower extremity.  Very weak cough and gag.   Dense left hemiplegia with hypotonia.   Plantars not elicitable bilaterally.   ASSESSMENT/PLAN Mr. Virat Prather is a 73 y.o. male with history of tobacco abuse, and pulmonary embolus, paroxysmal atrial fibrillation, hypertension, alcohol abuse with history of coagulopathy in setting of Xarelto from prior alcoholism presenting with dizziness, diaphoresis and bradycardia. Initially felt to  be cardiac but cerebellar bleed found on imaging.   Hemorrhage: Left cerebellar ICH with IVH on Xarelto and aspirin, s/p [place] reversal and suboccipital hematoma evacuation. Developed hydrocephalus s/p EVD, the resultant R SDH requiring surgical evacuation  Neurosurgery consult Dr. Ronnald Ramp, post suboccipital Craniectomy 01/02/2019.Marland Kitchen Neurological worsening due to hydrocephalus 01/02/2019 requiring emergent ventriculostomy followed by further neurological worsening due to acute right subdural hematoma requiring emergent right craniotomy for hematoma evacuation  CT head 6/17 left cerebellar hemorrhage 28 mils with IVH.  Posterior fossa without hydrocephalus  CTA head motion degraded.  No gross AVM or aneurysm  CT head 6/18 L suboccipital craniotomy, evacuation L cerebellar hemorrhage.  Decreased mass-effect on fourth ventricle and posterior fossa.  IVH bilaterally.  Minimal  pneumocephalus.  Atherosclerosis.  CT Head - 6/19 - Stable volume of intracranial hemorrhage that is mainly intraventricular.   CT Head - 6/21 6063 - interval worsening lateral and 3rd vent hydrocephalus. Stable L cerebellar hmg evacuation  CT Head - 6/21 0715 - interval development large R SDH 22mm thick w/ 62mm shift. EVD placement with decompression of ventricles. Postop L occipital crani stable.  CT repeat 6/22 0321 stable hematoma s/p evacuation and IVH, no hydrocephalus but right hemisphere developing hygroma  Consider MRI to rule out hemorrhagic infarct once able to lay flat longer   2D Echo EF 50 to 55%  LDL 48  HgbA1c 6.1  SCDs for VTE prophylaxis  aspirin 81 mg daily and Xarelto (rivaroxaban) daily prior to admission, now on No antithrombotic given hemorrhage.   Therapy recommendations:  CIR   Disposition:  pending   Poor prognosis. Dr. Leonie Man meeting with family. They want continued aggressive care for now.   Cerebral Edema - Induced Hypernatremia  Treated with 3%, now off  Also on dexamethasone per neurosurgery  Na 155  Goal Na 150-155  Check Na daily  On free water 300 q6h  Hydrocephalus and right hemisphere hygroma   EVD placement   Neurological worsening due to hydrocephalus 01/02/2019 requiring emergent ventriculostomy followed by further neurological worsening due to acute right subdural hematoma requiring emergent right craniotomy for hematoma evacuation  CT 6/22 stable post evacuation  EVD clamped 6/23  New onset seizure  Started 6/23 0929 after EVD clamped  Treated w/ versed  Loaded with Keppra and valproic acid   EEG pending   Repeat CT head in am  Acute Hypoxic Respiratory failure   Secondary to stroke   Intubated in ED   Extubated 12/31/18  Tolerating well  Continue to have copious secretions  On 3% neb Q6h while awake  NT suction  Post CABG Atrial Fibrillation  CABG 06/2018  Home anticoagulation:  Xarelto  (rivaroxaban) daily   Reversed with [place]  AC on hold d/t bleed  Hypertensive emergency  Home meds: Cozaar 100, Toprol 50  Systolic blood pressure greater than 200s with hemorrhage   Treated with Cleviprex   On norvasc 2.5 (new today), losartan 100, metoprolol 50 bid; PRN hydralazine 20 q6h  SBP goal <160   Currently stable  D/c cleviprex without plans to resume  Restlessness  Developed restlessness after extubation  Put on Precedex, now off  Close monitoring  Hyperlipidemia  Home meds: Lipitor 80  LDL 48, at goal < 70  Hold statin given hemorrhage  Consider continuation of statin lower dose at discharge  Dysphagia   Secondary to stroke   Currently n.p.o.   Speech following  Cortrak placed 01/01/2019  Now on tube feeds  Leukocytosis   WBC (  on Decadron)->15.0  TMax 100.2  UA - 01/01/2019 - normal  Other Stroke Risk Factors  Advanced age  Former smoker, quit smoking 6 months ago  History of alcohol abuse, last drink 10 years ago  Coronary artery disease, NSTEMI s/p CABG, December 2019  Other Active Problems  Mildly elevated troponin 0.05, reactionary  Anemia 10.8  Thrombocytopenia 147  Hospital day # 6 Patient has shown neurological improvement in mental status following surgery for evacuation of subdural hemorrhage but appears to have developed partial seizures surprisingly rising from the left hemisphere.  Continue Keppra and valproic acid for seizures and follow EEG results and keep on long-term EEG monitoring for 24 hours.  Repeat CT scan tomorrow morning and if ventricular size is stable will discontinue ventriculostomy as per neurosurgery team.  Discussed with Dr. Nelda Marseille critical care medicine and neurosurgery This patient is critically ill and at significant risk of neurological worsening, death and care requires constant monitoring of vital signs, hemodynamics,respiratory and cardiac monitoring, extensive review of multiple databases,  frequent neurological assessment, discussion with family, other specialists and medical decision making of high complexity.I have made any additions or clarifications directly to the above note.This critical care time does not reflect procedure time, or teaching time or supervisory time of PA/NP/Med Resident etc but could involve care discussion time.  I spent 40 minutes of neurocritical care time  in the care of  this patient.      Antony Contras, MD  To contact Stroke Continuity provider, please refer to [email or web address] After hours, contact General Neurology

## 2019-01-05 NOTE — Progress Notes (Signed)
Subjective: Patient sedated and intubated, arouses to noxious stimuli  Objective: Vital signs in last 24 hours: Temp:  [98.8 F (37.1 C)-100.2 F (37.9 C)] 99.3 F (37.4 C) (06/23 0700) Pulse Rate:  [47-95] 94 (06/23 0729) Resp:  [13-26] 21 (06/23 0729) BP: (101-167)/(50-97) 167/73 (06/23 0729) SpO2:  [91 %-99 %] 97 % (06/23 0729) Arterial Line BP: (121-201)/(48-111) 156/59 (06/23 0700) FiO2 (%):  [40 %] 40 % (06/23 0729)  Intake/Output from previous day: 06/22 0701 - 06/23 0700 In: 2123.9 [I.V.:538; NG/GT:1285.9; IV Piggyback:300] Out: 1887 [Urine:1725; Drains:162] Intake/Output this shift: No intake/output data recorded.  Neurologic: unable to follow commands, moves right side to noxious stimuli, minimal withdraw on the left  Lab Results: Lab Results  Component Value Date   WBC 15.0 (H) 01/05/2019   HGB 10.8 (L) 01/05/2019   HCT 33.8 (L) 01/05/2019   MCV 93.6 01/05/2019   PLT 147 (L) 01/05/2019   Lab Results  Component Value Date   INR 1.2 12/31/2018   BMET Lab Results  Component Value Date   NA 155 (H) 01/05/2019   K 3.7 01/05/2019   CL 125 (H) 01/05/2019   CO2 21 (L) 01/05/2019   GLUCOSE 159 (H) 01/05/2019   BUN 63 (H) 01/05/2019   CREATININE 1.17 01/05/2019   CALCIUM 8.2 (L) 01/05/2019    Studies/Results: Ct Head Wo Contrast  Result Date: 01/04/2019 CLINICAL DATA:  Subdural hemorrhage follow-up EXAM: CT HEAD WITHOUT CONTRAST TECHNIQUE: Contiguous axial images were obtained from the base of the skull through the vertex without intravenous contrast. COMPARISON:  01/09/2019 FINDINGS: Brain: Status post decompressive craniotomy for subdural hematoma. There is a subdural drainage catheter within the right subdural space. Unchanged position of right frontal approach shunt catheter. There is a large amount of pneumocephalus, predominantly over the frontal lobes. The distribution of intraventricular blood is unchanged. Left cerebellum hematoma evacuation cavity is  unchanged. Ventricular obstruction has resolved. Decreased dilatation of the left lateral ventricle. Midline shift has resolved. Amount of blood within the left subdural space is unchanged. Vascular: Atherosclerotic calcification of the vertebral and internal carotid arteries at the skull base. No abnormal hyperdensity of the major intracranial arteries or dural venous sinuses. A Skull: Interval right pterional craniotomy. Sinuses/Orbits: No fluid levels or advanced mucosal thickening of the visualized paranasal sinuses. No mastoid or middle ear effusion. The orbits are normal. IMPRESSION: 1. Status post right pterional craniotomy for decompression of right-sided subdural hematoma. 2. Resolution of midline shift and ventricular entrapment. Markedly decreased size of subdural hematoma over the right convexity. 3. Unchanged appearance of left cerebellar hemorrhage of actuation cavity. Unchanged volume of blood over the left convexity. 4. Moderate pneumocephalus. Electronically Signed   By: Ulyses Jarred M.D.   On: 01/04/2019 03:44   Dg Chest Port 1 View  Result Date: 01/04/2019 CLINICAL DATA:  Acute respiratory failure with hypoxia. EXAM: PORTABLE CHEST 1 VIEW COMPARISON:  Radiograph of January 03, 2019. FINDINGS: Stable position of endotracheal and feeding tubes. Status post coronary artery bypass graft. No pneumothorax is noted. Right lung is clear. Stable left basilar atelectasis or infiltrate is noted with associated pleural effusion is noted. Bony thorax is unremarkable. IMPRESSION: Stable support apparatus. Stable left basilar opacity as described above Electronically Signed   By: Marijo Conception M.D.   On: 01/04/2019 07:44    Assessment/Plan: Subdural drain discontinued, no output from EVD per nursing. Will clamp EVD today and monitor ICP. CT head tomorrow morning.    LOS: 6 days  Randy [name] 01/05/2019, 7:51 AM

## 2019-01-05 NOTE — Progress Notes (Signed)
Spot EEG complete. vLTM EEG following routine EEG based on findings on study

## 2019-01-06 ENCOUNTER — Inpatient Hospital Stay (HOSPITAL_COMMUNITY): Payer: Medicare Other

## 2019-01-06 LAB — COMPREHENSIVE METABOLIC PANEL
ALT: 18 U/L (ref 0–44)
AST: 36 U/L (ref 15–41)
Albumin: 2.4 g/dL — ABNORMAL LOW (ref 3.5–5.0)
Alkaline Phosphatase: 32 U/L — ABNORMAL LOW (ref 38–126)
Anion gap: 11 (ref 5–15)
BUN: 62 mg/dL — ABNORMAL HIGH (ref 8–23)
CO2: 21 mmol/L — ABNORMAL LOW (ref 22–32)
Calcium: 7.9 mg/dL — ABNORMAL LOW (ref 8.9–10.3)
Chloride: 120 mmol/L — ABNORMAL HIGH (ref 98–111)
Creatinine, Ser: 1.18 mg/dL (ref 0.61–1.24)
GFR calc Af Amer: >60 mL/min (ref >60)
GFR calc non Af Amer: >60 mL/min (ref >60)
Glucose, Bld: 181 mg/dL — ABNORMAL HIGH (ref 70–99)
Potassium: 3.8 mmol/L (ref 3.5–5.1)
Sodium: 152 mmol/L — ABNORMAL HIGH (ref 135–145)
Total Bilirubin: 0.5 mg/dL (ref 0.3–1.2)
Total Protein: 5.2 g/dL — ABNORMAL LOW (ref 6.5–8.1)

## 2019-01-06 LAB — CBC
HCT: 35.8 % — ABNORMAL LOW (ref 39.0–52.0)
Hemoglobin: 11.7 g/dL — ABNORMAL LOW (ref 13.0–17.0)
MCH: 30.4 pg (ref 26.0–34.0)
MCHC: 32.7 g/dL (ref 30.0–36.0)
MCV: 93 fL (ref 80.0–100.0)
Platelets: 150 10*3/uL (ref 150–400)
RBC: 3.85 MIL/uL — ABNORMAL LOW (ref 4.22–5.81)
RDW: 15.9 % — ABNORMAL HIGH (ref 11.5–15.5)
WBC: 21.2 10*3/uL — ABNORMAL HIGH (ref 4.0–10.5)
nRBC: 0.2 % (ref 0.0–0.2)

## 2019-01-06 LAB — GLUCOSE, CAPILLARY
Glucose-Capillary: 158 mg/dL — ABNORMAL HIGH (ref 70–99)
Glucose-Capillary: 162 mg/dL — ABNORMAL HIGH (ref 70–99)
Glucose-Capillary: 170 mg/dL — ABNORMAL HIGH (ref 70–99)
Glucose-Capillary: 174 mg/dL — ABNORMAL HIGH (ref 70–99)
Glucose-Capillary: 186 mg/dL — ABNORMAL HIGH (ref 70–99)
Glucose-Capillary: 197 mg/dL — ABNORMAL HIGH (ref 70–99)
Glucose-Capillary: 201 mg/dL — ABNORMAL HIGH (ref 70–99)

## 2019-01-06 LAB — TRIGLYCERIDES: Triglycerides: 94 mg/dL (ref <150)

## 2019-01-06 LAB — VALPROIC ACID LEVEL: Valproic Acid Lvl: 32 ug/mL — ABNORMAL LOW (ref 50.0–100.0)

## 2019-01-06 LAB — SODIUM: Sodium: 156 mmol/L — ABNORMAL HIGH (ref 135–145)

## 2019-01-06 MED ORDER — DEXTROSE 5 % IV SOLN: INTRAVENOUS | Status: DC

## 2019-01-06 MED ORDER — VALPROATE SODIUM 500 MG/5ML IV SOLN
750.0000 mg | Freq: Three times a day (TID) | INTRAVENOUS | Status: DC
  Administered 2019-01-06 – 2019-01-07 (×3): 750 mg via INTRAVENOUS
  Filled 2019-01-06 (×5): qty 7.5

## 2019-01-06 NOTE — Progress Notes (Signed)
PT transported on vent to CT and back with no complications.

## 2019-01-06 NOTE — Progress Notes (Signed)
Overall stable.  Patient awakens and follows some simple commands with his right side.  Will weakly flex on his left.  Opens his eyes to noxious stimuli.  Follow-up head CT scan demonstrates good appearance after his craniotomy for subdural.  No evidence of recurrent or residual subdural hematoma.  Ventricular catheter well positioned.  Ventricles are small.  No new recommendations.  Continue supportive efforts.

## 2019-01-06 NOTE — Progress Notes (Signed)
Subjective: Patient intubated and sedated  Objective: Vital signs in last 24 hours: Temp:  [98.6 F (37 C)-100.4 F (38 C)] 99.3 F (37.4 C) (06/24 0700) Pulse Rate:  [56-101] 89 (06/24 0724) Resp:  [14-33] 21 (06/24 0724) BP: (97-173)/(46-112) 130/82 (06/24 0700) SpO2:  [92 %-100 %] 99 % (06/24 0724) Arterial Line BP: (105-189)/(45-84) 160/68 (06/24 0700) FiO2 (%):  [40 %] 40 % (06/24 0724)  Intake/Output from previous day: 06/23 0701 - 06/24 0700 In: 2296.5 [I.V.:246.5; NG/GT:1365; IV Piggyback:685] Out: 860 [Urine:850; Drains:10] Intake/Output this shift: No intake/output data recorded.  Neurologic: minimal left sided movement to painful stimulus, PERRLA  Lab Results: Lab Results  Component Value Date   WBC 15.0 (H) 01/05/2019   HGB 10.8 (L) 01/05/2019   HCT 33.8 (L) 01/05/2019   MCV 93.6 01/05/2019   PLT 147 (L) 01/05/2019   Lab Results  Component Value Date   INR 1.2 12/31/2018   BMET Lab Results  Component Value Date   NA 156 (H) 01/06/2019   K 3.7 01/05/2019   CL 125 (H) 01/05/2019   CO2 21 (L) 01/05/2019   GLUCOSE 159 (H) 01/05/2019   BUN 63 (H) 01/05/2019   CREATININE 1.17 01/05/2019   CALCIUM 8.2 (L) 01/05/2019    Studies/Results: Ct Head Wo Contrast  Result Date: 01/06/2019 CLINICAL DATA:  Intracranial hemorrhage follow up EXAM: CT HEAD WITHOUT CONTRAST TECHNIQUE: Contiguous axial images were obtained from the base of the skull through the vertex without intravenous contrast. COMPARISON:  None. FINDINGS: Brain: Unchanged volume of intraventricular hemorrhage with small amount of residual blood products over both convexities. Hematoma evacuation cavity in the left cerebellum is unchanged. No midline shift or mass effect. Slightly decreased size of the lateral ventricles. Pneumocephalus has partially resolved. The right frontal roach EVD is unchanged. Vascular: Atherosclerotic calcification of the vertebral and internal carotid arteries at the skull  base. No abnormal hyperdensity of the major intracranial arteries or dural venous sinuses. Skull: There is no disc signal abnormality. There is a intermediate size central disc protrusion at T8-9 effaces the ventral thecal sac without causing spinal canal stenosis. Sinuses/Orbits: No fluid levels or advanced mucosal thickening of the visualized paranasal sinuses. No mastoid or middle ear effusion. The orbits are normal. IMPRESSION: Slight decrease in size the lateral ventricles and mildly decreased pneumocephalus. Otherwise unchanged examination. Electronically Signed   By: Ulyses Jarred M.D.   On: 01/06/2019 01:48   Dg Chest Port 1 View  Result Date: 01/05/2019 CLINICAL DATA:  Endotracheal tube EXAM: PORTABLE CHEST 1 VIEW COMPARISON:  Yesterday FINDINGS: Endotracheal tube tip just below the clavicular heads. Feeding tube that reaches the stomach. 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 Improved left lower lobe aeration. There is still mild atelectasis at the bases. No edema or pneumothorax. IMPRESSION: 1. Stable hardware positioning. 2. Improved left base aeration. Electronically Signed   By: Monte Fantasia M.D.   On: 01/05/2019 08:59    Assessment/Plan: ICP less than 10 but waveform not reliable on the monitor. CT head stable with a decrease in ventricular size while EVD clamped for over 24 hours now.    LOS: 7 days    Ocie Cornfield [facility] 01/06/2019, 7:38 AM

## 2019-01-06 NOTE — Progress Notes (Addendum)
NAME:  Randy [name], MRN:  [id number], DOB:  07/09/1946, LOS: 7 ADMISSION DATE:  01/02/2019, CONSULTATION DATE: June 17 REFERRING MD: Dr. Rory Percy, CHIEF COMPLAINT: Intraparenchymal hemorrhage  Brief History   73 year old male anticoagulated with Xarelto presented with intraparenchymal hematoma and was emergently taken to the OR for evacuation.  Past Medical History  Tobacco Abuse , PE , PAF - on Xarelto, NSTEMI , HTN ,ETOH Abuse   Significant Hospital Events   6/17 Admit with cerebellar ICH. To OR for evacuation. Out to ICU intubated.  6/17->6/22: extubated 6/18, had mild hydrocephalus, increased edema, decadron taper,  started on hypertonic saline, agitated started on precedex, on cleviprex to keep sbp < 160. 6/21 decreased LOC, intubated for airway protection, CT head w/ obstructive hydrocephalus. Right frontal IVC placed. Episodes of bradycardia. New right sided SDH felt 2/2 ICV placement. Went to OR again for right crani and placement of subdural drain.  6/22: Subdural drain removed, Seizure-like activity (involuntary twitching), EEG started, Clamped IVC per neurosurgery   6/23 not much change. MS a little worse.   Consults:  Neurosurgery PCCM  Procedures:  ETT 6/17 > 6/18 ETT 6/21 >>  Art line 6/17 > 6/18 Ventriculostomy 6/21 >> Clamped 6/23 >>     Significant Diagnostic Tests:  CT head 6/17: Acute intraparenchymal hemorrhage within the left cerebellum with volume of 28 mL and intraventricular extension to the third and fourth ventricles. 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 CTA head 6/17: Severely motion degraded study. No proximal large vessel occlusion of the anterior circulation. The hemorrhage pattern of the left cerebellum is most consistent with hypertensive hemorrhage. CT head 6/18: L suboccipital craniotomy, evacuation L cerebellar hemorrhage. Decreased mass-effect on fourth  ventricle and posterior fossa. IVH bilaterally. Minimal pneumocephalus. Atherosclerosis ECHO 6/18: Severe akinesis of the left ventricular, mid-apical apical segment and lateral wall, LVEF ~ 50-55%, No RWMA, normal RV systolic function, moderate MV regurgitation CT head 6/22: 1. Status post right pterional craniotomy for decompression of right-sided subdural hematoma. 2. Resolution of midline shift and ventricular entrapment. Markedly decreased size of subdural hematoma over the right convexity. 3. Unchanged appearance of left cerebellar hemorrhage of actuation cavity. Unchanged volume of blood over the left convexity. 4. Moderate pneumocephalus. EEG 6/23: Evidence of cerebral dysfunction maximal in the right hemisphere and a severe diffuse encephalopathy. No epileptiform discharges or EEG seizures were recorded.  CT head 6/24: Slightly decrease in size the lateral ventricles and mildly decreased pneumocephalus while EVD clamped for 24 hours. Otherwise unchanged examination.  Micro Data:  6/17 SARS-CoV-2 > neg  Antimicrobials:  6/17 Periop cefazolin  Interim history/subjective:  Remains intubated, intermittently follows commands on RUE and RLE  Objective   Blood pressure 130/82, pulse 89, temperature 99.3 F (37.4 C), resp. rate (Abnormal) 21, height 5\' 4"  (1.626 m), weight 68.1 kg, SpO2 99 %.    Vent Mode: PCV FiO2 (%):  [40 %] 40 % Set Rate:  [15 bmp] 15 bmp PEEP:  [5 cmH20] 5 cmH20 Pressure Support:  [5 cmH20] 5 cmH20 Plateau Pressure:  [10 cmH20-15 cmH20] 15 cmH20   Intake/Output Summary (Last 24 hours) at 01/06/2019 0914 Last data filed at 01/06/2019 0700 Gross per 24 hour  Intake 2219.49 ml  Output 850 ml  Net 1369.49 ml   Filed Weights   01/09/2019 1352 12/27/2018 2245  Weight: 68 kg 68.1 kg    Examination: General: 73 year old male sitting comfortably in bed HENT: [place]/AT, EVD  clamped. ETT in place. Enteric tube in place Pulm: Intubated. Diminished throughout, tan-colored  secretions Card: RRR. No murmurs, gallops, or rubs. Occasional PVCs Abd: Soft, nontender, present bowel sounds x4 Neuro: Intermittently follows commands on RUE and RLE. Localizes pain in LUE and LLE. Does not open eyes to command.  GU: Clear to yellow. Adequate urine output Ext: No edema, brisk capillary refill. Staples approximated on head.   Resolved Hospital Problem list     Assessment & Plan:   Cerebellar Intraparenchymal Hemorrhage, evacuated 6/17 Acute progression of hydrocephalus 6/21 requiring ventriculostomy placement Acute right subdural hematoma s/p evacuation 6/21 Intermittently follows commands on RUE and RLE; localizes pain in LUE and LLE. Does not open eyes to command Plan Continue serial neuro checks Treated with hypertonic saline, now off.  Continue Decadron per Neuro-Surg Maintain SBP <160 Per Neuro note, Poor prognosis. Dr. Leonie Man meeting with family. They want to continue aggressive care for now. IVC management per neuro-surg Defer decadron to neuro/neuro surg team  Seizure-like activity (right eye twitching and gaze pref) EEG 6/23 shows evidence of cerebral dysfunction maximal in the right hemisphere and a severe diffuse encephalopathy. No epileptiform discharges or EEG seizures were recorded.  Plan Cont current keppra and VPA per neuro LTM per neuro   Acute respiratory failure, VDRF In the setting of IPH, SDH Plan Full vent support PAD protocol; RASS goal: 0 to -1 VAP bundle Daily assessment for SBT, current mental status does not support extubation trial   Atrial fibrillation on Xarelto Currently sinus rhythm on telemetry. Coagulopathy reversed with [facility] in ED Plan Hold AC d/t head bleed  Hypertension Currently stable Plan Maintain SBP <160 Continue medications: cozzar, lopressor, and amlopidine; PRN hydralazine 20 q6h   Fluid and electrolyte imbalance:  Hypernatremia (therapeutic), NAG Acidosis 2/2 hyperchloremia, borderline hypokalemia Plan  hypertonic saline, now off Maintain normal Na levels. On free water 312ml Q6h  Leukocytosis WBC (on Decadron) 21.1, Afebrile (Tmax 37.8) Plan:  Continue to trend WBC Pan-culture if Tmax > 38.0  Anemia  Hbg stable, plt stable  Plan Continue to trend CBC Transfuse for Hgb <7 SCDs for DVT prophylaxis  Hyperglycemia  Persists Plan Continue SSI and low dose basal  History of ETOH Abuse  Remote history. Per patient, patient has not used for about 10 years. LFTs normal upon arrival.  Plan Supportive care   Best practice:  Diet: NPO; at goal rate for tubefeeds Pain/Anxiety/Delirium protocol (if indicated): off gtts. PAD protocol in place. PRNs only  VAP protocol (if indicated): 6/21 DVT prophylaxis: SCD GI prophylaxis: PPI Glucose control: SSI Mobility: BR Code Status: FULL Family Communication: Neurology and NSGY have discussed w family 6/21 Disposition:  Remains critically ill. Require CCM service for BP control, mechanical ventilation titration, and titration/ management of fluid, electrolyte and acid-base imbalances. Neuro exam is significant for intermittently following commands on RUE and RLE and localizing pain on LUE and LLE. EEG shows evidence of cerebral dysfunction maximal in the right hemisphere and a severe diffuse encephalopathy. EVD was clamped. Hypernatremia slowly normalizing. BP currently well controlled. New leukocytosis on decadron, patient remains afebrile. Will continue to evaluate over the next few days. Suspect we will be working towards one-way extubation.   Critical care time:  32 min.    Erick Colace ACNP-BC [facility] Pulmonary/Critical Care Pager # [phone] OR # [phone] if no answer  Attending Note:  73 year old male with ICH s/p crani who presents to [facility] for vent management.  Patient is able to wean  but not awake enough to extubate with seizures overnight that now resolved on EEG.  I reviewed CXR myself, no acute disease noted.  Discussed with  PCCM-NP and neurology MD.  Patient is stable from a respiratory standpoint.  Neurology to manage seizure and NS for ICH.  I do not anticipate patient will extubate given neuro status.  Will allow for a few more days prior to decision on one way extubation vs trach/peg.  Recommend against trach/peg.  The patient is critically ill with multiple organ systems failure and requires high complexity decision making for assessment and support, frequent evaluation and titration of therapies, application of advanced monitoring technologies and extensive interpretation of multiple databases.   Critical Care Time devoted to patient care services described in this note is  32  Minutes. This time reflects time of care of this signee Dr Jennet Maduro. This critical care time does not reflect procedure time, or teaching time or supervisory time of PA/NP/Med student/Med Resident etc but could involve care discussion time.  Rush Farmer, M.D. [facility] Pulmonary/Critical Care Medicine. Pager: [phone]. After hours pager: [phone].

## 2019-01-06 NOTE — Progress Notes (Signed)
STROKE TEAM PROGRESS NOTE   INTERVAL HISTORY  Patient continues to be on ventilatory support for respiratory failure.  Continues to be drowsy on sedation but has not had any further witnessed seizures.  EEG had shown electrographic seizures while on Keppra and hence IV Depacon was added.  His long-term EEG monitoring was suboptimal and recorded only for 7 hours due to technical glitches but did not show any ongoing seizures.  He is on Depacon 500 every 8 and valproic acid level is low at 32 this morning. Vitals:   01/06/19 0800 01/06/19 0900 01/06/19 1000 01/06/19 1122  BP: 101/64 136/86 (!) 126/92 111/75  Pulse: 77 (!) 105 98   Resp: 18 (!) 22 20   Temp: 98.2 F (36.8 C) 99.7 F (37.6 C) 100 F (37.8 C)   TempSrc:      SpO2: 98% 98% 98% 99%  Weight:      Height:        CBC:  Recent Labs  Lab 01/05/19 0550 01/06/19 0824  WBC 15.0* 21.2*  HGB 10.8* 11.7*  HCT 33.8* 35.8*  MCV 93.6 93.0  PLT 147* 606    Basic Metabolic Panel:  Recent Labs  Lab 01/04/19 0518  01/05/19 0550 01/06/19 0500 01/06/19 0824  NA 151*   < > 155* 156* 152*  K 3.5   < > 3.7  --  3.8  CL 121*  --  125*  --  120*  CO2 20*  --  21*  --  21*  GLUCOSE 162*  --  159*  --  181*  BUN 53*  --  63*  --  62*  CREATININE 1.15  --  1.17  --  1.18  CALCIUM 8.3*  --  8.2*  --  7.9*  MG 2.5*  --  2.7*  --   --   PHOS  --   --  2.9  --   --    < > = values in this interval not displayed.   IMAGING  Ct Head Wo Contrast 01/06/2019 Slight decrease in size the lateral ventricles and mildly decreased pneumocephalus. Otherwise unchanged examination.   Repeat Ct Head Wo Contrast 01/04/2019  resolution of right-sided subdural hematoma and midline shift with postoperative changes.  Right ventricular catheter in optimal position.  Unchanged appearance of left cerebellar hematoma evacuation.  Dg Chest Port 1 View 01/05/2019 1. Stable hardware positioning. 2. Improved left base aeration.  EEG  01/06/2019 This EEG  shows evidence of cerebral dysfunction maximal in the right hemisphere and a severe diffuse encephalopathy. No epileptiform discharges or EEG seizures were recorded.  01/05/2019 This is an abnormal EEG.  There is evidence of a seizure tendency emanating from the right central area of the brain with an electrographic seizure emanating from the right frontocentral area of the brain.     PHYSICAL EXAM:General - Well nourished, well developed, elderly Caucasian male who is intubated sedated Ophthalmologic - fundi not visualized due to noncooperation.  Cardiovascular - Regular rate and rhythm.  Neuro -sedated intubated.  Eyes are closed.  Right pupil 4 mm not reactive.  Left 3 mm reactive.  Patient is able to follow simple commands and midline as well as right upper and lower extremity as per the nurse but did not do so for me..  Very weak cough and gag.   Dense left hemiplegia with hypotonia.   Plantars not elicitable bilaterally.   ASSESSMENT/PLAN Randy [name] is a 73 y.o. male with history of tobacco abuse,  and pulmonary embolus, paroxysmal atrial fibrillation, hypertension, alcohol abuse with history of coagulopathy in setting of Xarelto from prior alcoholism presenting with dizziness, diaphoresis and bradycardia. Initially felt to be cardiac but cerebellar bleed found on imaging.   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 Developed hydrocephalus s/p EVD, the resultant R SDH requiring surgical evacuation  Neurosurgery consult Dr. Ronnald Ramp, post suboccipital Craniectomy 12/22/2018.Marland Kitchen Neurological worsening due to hydrocephalus 01/02/2019 requiring emergent ventriculostomy followed by further neurological worsening due to acute right subdural hematoma requiring emergent right craniotomy for hematoma evacuation  CT head 6/17 left cerebellar hemorrhage 28 mils with IVH.  Posterior fossa without hydrocephalus  CTA head motion  degraded.  No gross AVM or aneurysm  CT head 6/18 L suboccipital craniotomy, evacuation L cerebellar hemorrhage.  Decreased mass-effect on fourth ventricle and posterior fossa.  IVH bilaterally.  Minimal pneumocephalus.  Atherosclerosis.  CT Head - 6/19 - Stable volume of intracranial hemorrhage that is mainly intraventricular.   CT Head - 6/21 7616 - interval worsening lateral and 3rd vent hydrocephalus. Stable L cerebellar hmg evacuation  CT Head - 6/21 0715 - interval development large R SDH 21mm thick w/ 75mm shift. EVD placement with decompression of ventricles. Postop L occipital crani stable.  CT repeat 6/22 0321 stable hematoma s/p evacuation and IVH, no hydrocephalus but right hemisphere developing hygroma  CT head 6/24 slight decrease in ventricle size  Consider MRI to rule out hemorrhagic infarct once able to lay flat longer   2D Echo EF 50 to 55%  LDL 48  HgbA1c 6.1  SCDs for VTE prophylaxis  aspirin 81 mg daily and Xarelto (rivaroxaban) daily prior to admission, now on No antithrombotic given hemorrhage.   Therapy recommendations:  CIR   Disposition:  pending   Poor prognosis. Continued aggressive care per family request.  Cerebral Edema - Induced Hypernatremia   Treated with 3%, now off  Also on dexamethasone per neurosurgery  Na 152  Goal Na 150-155  Check Na daily  On free water 300 q6h  Hydrocephalus and right hemisphere hygroma   EVD placement   Neurological worsening due to hydrocephalus 01/02/2019 requiring emergent ventriculostomy followed by further neurological worsening due to acute right subdural hematoma requiring emergent right craniotomy for hematoma evacuation  CT 6/22 stable post evacuation  EVD clamped 6/23  CT 6/24 decreasing ventricle size  New onset seizure  Started 6/23 0929 after EVD clamped  Treated w/ versed  Loaded with Keppra and valproic acid   EEG w/ seizure  LT EEG w/ R sided dysfunction, no seizure   D/c  LT EEG  D/c keppra  Increase valproic acid dose to 750 q 8h on 01/06/2019  Acute Hypoxic Respiratory failure / VDRF  Secondary to stroke   Intubated in ED   Extubated 12/31/18  Tolerating well  Continue to have copious secretions  On 3% neb Q6h while awake  NT suction  Post CABG Atrial Fibrillation  CABG 06/2018  Home anticoagulation:  Xarelto (rivaroxaban) daily   Reversed with [place]  AC on hold d/t bleed  Hypertensive emergency  Home meds: Cozaar 100, Toprol 50  Systolic blood pressure greater than 200s with hemorrhage   Treated with Cleviprex   On norvasc 2.5, losartan 100, metoprolol 50 bid; PRN hydralazine 20 q6h  SBP goal <160   Currently stable  Restlessness  Developed restlessness after extubation  Put on Precedex, now off  Close monitoring  Hyperlipidemia  Home meds:  Lipitor 80  LDL 48, at goal < 70  Hold statin given hemorrhage  Consider continuation of statin lower dose at discharge  Hyperglycemia  HgbA1c 6.1  On novolog 3u q4h  SSI  Dysphagia   Secondary to stroke   Currently n.p.o.   Speech following  Cortrak placed 01/01/2019  Now on tube feeds  Leukocytosis   WBC (on Decadron)->15.0->21.1  TMax 100.2  UA - 01/01/2019 - normal  Other Stroke Risk Factors  Advanced age  Former smoker, quit smoking 6 months ago  History of alcohol abuse, last drink 10 years ago. LFTs normal  Coronary artery disease, NSTEMI s/p CABG, December 2019  Other Active Problems  Mildly elevated troponin 0.05, reactionary  Anemia 10.8  Thrombocytopenia 147  Hospital day # 7  Patient had neurological decline due to focal seizure status but appears to have improved on IV Depacon but the level is suboptimal hence will increase the dose to 750 mg every 8 hourly.  Discontinue long-term EEG monitoring.  Continue ventilatory lightly support for respiratory failure.  Wean off ventilatory support as per critical care team.I spoke to his  brother and son over the phone and answered questions about his care.  Continue supportive care for the next few days to see if he continues to improve in order to consider extubation else will have to make decisions about goals of care and prolonged ventilatory support.  Family is in agreement with plan.  Discussed with Dr. Nelda Marseille This patient is critically ill and at significant risk of neurological worsening, death and care requires constant monitoring of vital signs, hemodynamics,respiratory and cardiac monitoring, extensive review of multiple databases, frequent neurological assessment, discussion with family, other specialists and medical decision making of high complexity.I have made any additions or clarifications directly to the above note.This critical care time does not reflect procedure time, or teaching time or supervisory time of PA/NP/Med Resident etc but could involve care discussion time.  I spent 35 minutes of neurocritical care time  in the care of  this patient.      Antony Contras, MD  To contact Stroke Continuity provider, please refer to [email or web address] After hours, contact General Neurology

## 2019-01-06 NOTE — Progress Notes (Signed)
LTM discont;  No skin breakdown

## 2019-01-06 NOTE — Progress Notes (Signed)
[facility] Progress Note Patient Name: Coreon Simkins DOB: 1945-09-04 MRN: [id number]   Date of Service  01/06/2019  HPI/Events of Note  RN requesting renewal of restraint order  eICU Interventions  Order renewed        Frederik Pear 01/06/2019, 10:27 PM

## 2019-01-06 NOTE — Procedures (Signed)
CPT/Type of Study: 63016; 2-12hr EEG with video Recording Date: 01/05/2019 10:49 - 01/06/2019 01:01 Interpreting physician: Izora Ribas, DO  History: This is a 73 year old patient, undergoing an EEG to evaluate for seizures.   Technical Description: The EEG was performed using standard setting per the guidelines of American Clinical Neurophysiology Society (ACNS).   A minimum of 21 electrodes were placed on scalp according to the International 10-20 or/and 10-10 Systems. Supplemental electrodes were placed as needed. Single EKG electrode was also used to detect cardiac arrhythmia. Patient's behavior was continuously recorded on video simultaneously with EEG. A minimum of 16 channels were used for data display. Each epoch of study was reviewed manually daily and as needed using standard referential and bipolar montages. Computerized quantitative EEG analysis (such as compressed spectral array analysis, trending, automated spike & seizure detection) were used as indicated.   Clinical State: Stupor Background: The posterior dominant rhythm was not recorded Overall Amplitude: Normal Predominant Frequency: Delta and theta rhythms maximal right  Asymmetry: Yes breach rhythm right frontal  Sleep background: Normal sleep architecture was not seen  Abnormalities: Continuous slow generalized and lateralized right Rhythmic or periodic pattern: None  Epileptiform activity: No Electrographic Seizure: No Events: No  Breach rhythm: Yes right  Reactivity: Yes  Stimulation procedures:  Hyperventilation: Not done Photic stimulation: Not done  Impression: This EEG shows evidence of cerebral dysfunction maximal in the right hemisphere and a severe diffuse encephalopathy. No epileptiform discharges or EEG seizures were recorded.

## 2019-01-07 ENCOUNTER — Other Ambulatory Visit (HOSPITAL_COMMUNITY): Payer: Self-pay | Admitting: *Deleted

## 2019-01-07 ENCOUNTER — Inpatient Hospital Stay (HOSPITAL_COMMUNITY): Payer: Medicare Other

## 2019-01-07 DIAGNOSIS — I619 Nontraumatic intracerebral hemorrhage, unspecified: Secondary | ICD-10-CM

## 2019-01-07 DIAGNOSIS — R569 Unspecified convulsions: Secondary | ICD-10-CM

## 2019-01-07 LAB — COMPREHENSIVE METABOLIC PANEL
ALT: 16 U/L (ref 0–44)
AST: 44 U/L — ABNORMAL HIGH (ref 15–41)
Albumin: 2.2 g/dL — ABNORMAL LOW (ref 3.5–5.0)
Alkaline Phosphatase: 36 U/L — ABNORMAL LOW (ref 38–126)
Anion gap: 11 (ref 5–15)
BUN: 63 mg/dL — ABNORMAL HIGH (ref 8–23)
CO2: 22 mmol/L (ref 22–32)
Calcium: 7.7 mg/dL — ABNORMAL LOW (ref 8.9–10.3)
Chloride: 120 mmol/L — ABNORMAL HIGH (ref 98–111)
Creatinine, Ser: 1.18 mg/dL (ref 0.61–1.24)
GFR calc Af Amer: >60 mL/min (ref >60)
GFR calc non Af Amer: >60 mL/min (ref >60)
Glucose, Bld: 189 mg/dL — ABNORMAL HIGH (ref 70–99)
Potassium: 3.7 mmol/L (ref 3.5–5.1)
Sodium: 153 mmol/L — ABNORMAL HIGH (ref 135–145)
Total Bilirubin: 0.6 mg/dL (ref 0.3–1.2)
Total Protein: 5.1 g/dL — ABNORMAL LOW (ref 6.5–8.1)

## 2019-01-07 LAB — CBC
HCT: 36 % — ABNORMAL LOW (ref 39.0–52.0)
Hemoglobin: 11.8 g/dL — ABNORMAL LOW (ref 13.0–17.0)
MCH: 30.3 pg (ref 26.0–34.0)
MCHC: 32.8 g/dL (ref 30.0–36.0)
MCV: 92.5 fL (ref 80.0–100.0)
Platelets: 146 10*3/uL — ABNORMAL LOW (ref 150–400)
RBC: 3.89 MIL/uL — ABNORMAL LOW (ref 4.22–5.81)
RDW: 15.7 % — ABNORMAL HIGH (ref 11.5–15.5)
WBC: 24.2 10*3/uL — ABNORMAL HIGH (ref 4.0–10.5)
nRBC: 0.1 % (ref 0.0–0.2)

## 2019-01-07 LAB — GLUCOSE, CAPILLARY
Glucose-Capillary: 164 mg/dL — ABNORMAL HIGH (ref 70–99)
Glucose-Capillary: 170 mg/dL — ABNORMAL HIGH (ref 70–99)
Glucose-Capillary: 180 mg/dL — ABNORMAL HIGH (ref 70–99)
Glucose-Capillary: 166 mg/dL — ABNORMAL HIGH (ref 70–99)
Glucose-Capillary: 138 mg/dL — ABNORMAL HIGH (ref 70–99)
Glucose-Capillary: 134 mg/dL — ABNORMAL HIGH (ref 70–99)

## 2019-01-07 LAB — VALPROIC ACID LEVEL: Valproic Acid Lvl: 50 ug/mL (ref 50.0–100.0)

## 2019-01-07 MED ORDER — EPINEPHRINE PF 1 MG/ML IJ SOLN
INTRAMUSCULAR | Status: AC
  Filled 2019-01-07: qty 1

## 2019-01-07 MED ORDER — LORAZEPAM 2 MG/ML IJ SOLN
0.5000 mg | INTRAMUSCULAR | Status: DC | PRN
  Administered 2019-01-07 – 2019-01-10 (×7): 2 mg via INTRAVENOUS
  Filled 2019-01-07 (×9): qty 1

## 2019-01-07 MED ORDER — PHENYLEPHRINE HCL-NACL 10-0.9 MG/250ML-% IV SOLN
INTRAVENOUS | Status: AC
  Administered 2019-01-07: 40 ug/min via INTRAVENOUS
  Filled 2019-01-07: qty 250

## 2019-01-07 MED ORDER — VALPROATE SODIUM 500 MG/5ML IV SOLN
1000.0000 mg | Freq: Three times a day (TID) | INTRAVENOUS | Status: DC
  Administered 2019-01-07 – 2019-01-10 (×9): 1000 mg via INTRAVENOUS
  Filled 2019-01-07 (×8): qty 10
  Filled 2019-01-07: qty 5
  Filled 2019-01-07 (×2): qty 10

## 2019-01-07 MED ORDER — FUROSEMIDE 10 MG/ML IJ SOLN
40.0000 mg | Freq: Three times a day (TID) | INTRAMUSCULAR | Status: AC
  Administered 2019-01-07 (×2): 40 mg via INTRAVENOUS
  Filled 2019-01-07 (×2): qty 4

## 2019-01-07 MED ORDER — SODIUM CHLORIDE 0.9 % IV BOLUS
500.0000 mL | Freq: Once | INTRAVENOUS | Status: AC
  Administered 2019-01-07: 500 mL via INTRAVENOUS

## 2019-01-07 MED ORDER — EPINEPHRINE 1 MG/10ML IJ SOSY
PREFILLED_SYRINGE | INTRAMUSCULAR | Status: AC
  Filled 2019-01-07: qty 10

## 2019-01-07 MED ORDER — METOLAZONE 5 MG PO TABS
5.0000 mg | ORAL_TABLET | Freq: Every day | ORAL | Status: AC
  Administered 2019-01-07: 5 mg
  Filled 2019-01-07: qty 1

## 2019-01-07 MED ORDER — FREE WATER
300.0000 mL | Status: DC
  Administered 2019-01-07 – 2019-01-10 (×19): 300 mL

## 2019-01-07 MED ORDER — PHENYLEPHRINE HCL-NACL 10-0.9 MG/250ML-% IV SOLN
0.0000 ug/min | INTRAVENOUS | Status: DC
  Administered 2019-01-07 (×2): 40 ug/min via INTRAVENOUS
  Administered 2019-01-08: 80 ug/min via INTRAVENOUS
  Administered 2019-01-08: 75 ug/min via INTRAVENOUS
  Administered 2019-01-08: 22:00:00 70 ug/min via INTRAVENOUS
  Administered 2019-01-08 (×3): 80 ug/min via INTRAVENOUS
  Administered 2019-01-08: 02:00:00 65 ug/min via INTRAVENOUS
  Administered 2019-01-08 (×2): 80 ug/min via INTRAVENOUS
  Administered 2019-01-08: 65 ug/min via INTRAVENOUS
  Administered 2019-01-09: 160 ug/min via INTRAVENOUS
  Administered 2019-01-09: 110 ug/min via INTRAVENOUS
  Administered 2019-01-09: 70 ug/min via INTRAVENOUS
  Administered 2019-01-09: 100 ug/min via INTRAVENOUS
  Administered 2019-01-09: 120 ug/min via INTRAVENOUS
  Administered 2019-01-09: 60 ug/min via INTRAVENOUS
  Administered 2019-01-10: 50 ug/min via INTRAVENOUS
  Administered 2019-01-10: 40 ug/min via INTRAVENOUS
  Filled 2019-01-07: qty 500
  Filled 2019-01-07: qty 250
  Filled 2019-01-07 (×2): qty 500
  Filled 2019-01-07: qty 250
  Filled 2019-01-07 (×3): qty 500
  Filled 2019-01-07: qty 250
  Filled 2019-01-07 (×2): qty 500
  Filled 2019-01-07: qty 250
  Filled 2019-01-07: qty 750

## 2019-01-07 MED ORDER — LORAZEPAM 2 MG/ML IJ SOLN: 0.5000 mg | Freq: Two times a day (BID) | INTRAMUSCULAR | Status: DC | PRN

## 2019-01-07 MED ORDER — LEVETIRACETAM IN NACL 1000 MG/100ML IV SOLN
1000.0000 mg | Freq: Two times a day (BID) | INTRAVENOUS | Status: DC
  Administered 2019-01-08 (×2): 1000 mg via INTRAVENOUS
  Filled 2019-01-07 (×3): qty 100

## 2019-01-07 MED ORDER — POTASSIUM CHLORIDE 20 MEQ/15ML (10%) PO SOLN
40.0000 meq | Freq: Three times a day (TID) | ORAL | Status: AC
  Administered 2019-01-07 (×2): 40 meq
  Filled 2019-01-07 (×2): qty 30

## 2019-01-07 NOTE — Progress Notes (Signed)
Subjective: Patient intubated and sedated still  Objective: Vital signs in last 24 hours: Temp:  [98.6 F (37 C)-101 F (38.3 C)] 99.2 F (37.3 C) (06/25 0500) Pulse Rate:  [36-169] 51 (06/25 0800) Resp:  [17-27] 23 (06/25 0800) BP: (80-159)/(54-120) 123/98 (06/25 0800) SpO2:  [97 %-100 %] 98 % (06/25 0800) Arterial Line BP: (101-139)/(49-61) 101/49 (06/24 1200) FiO2 (%):  [30 %] 30 % (06/25 0800)  Intake/Output from previous day: 06/24 0701 - 06/25 0700 In: 1880.2 [I.V.:120.3; NG/GT:1320; IV Piggyback:439.9] Out: 1750 [Urine:1750] Intake/Output this shift: Total I/O In: 142.8 [NG/GT:110; IV Piggyback:32.8] Out: -   Neurologic: unable to [facility], withdraws to noxious stimuli  Lab Results: Lab Results  Component Value Date   WBC 24.2 (H) 01/07/2019   HGB 11.8 (L) 01/07/2019   HCT 36.0 (L) 01/07/2019   MCV 92.5 01/07/2019   PLT 146 (L) 01/07/2019   Lab Results  Component Value Date   INR 1.2 12/31/2018   BMET Lab Results  Component Value Date   NA 153 (H) 01/07/2019   K 3.7 01/07/2019   CL 120 (H) 01/07/2019   CO2 22 01/07/2019   GLUCOSE 189 (H) 01/07/2019   BUN 63 (H) 01/07/2019   CREATININE 1.18 01/07/2019   CALCIUM 7.7 (L) 01/07/2019    Studies/Results: Ct Head Wo Contrast  Result Date: 01/07/2019 CLINICAL DATA:  Muscle weakness. Right-sided facial twitching and left upper extremity posturing EXAM: CT HEAD WITHOUT CONTRAST TECHNIQUE: Contiguous axial images were obtained from the base of the skull through the vertex without intravenous contrast. COMPARISON:  Yesterday and multiple priors dating back to 12/20/2018 admission head CT FINDINGS: Brain: Extensive cytotoxic edema in the parasagittal right frontal, parietal, and occipital lobes. There is local mass effect without midline shift. Left cerebellar hemorrhage and evacuation. Small volume residual without progression. No increasing ventricular clot or ventricular volume, a right frontal shunt is present. Mild  high-density thickening of the posterior falx and right tentorium attributed to subdural hematoma, without mass effect. There is extra-axial hemorrhage about the right pterional flap measuring up to 6 mm, stable. Pneumocephalus. Small volume parenchymal hemorrhage along the anterior aspect of the ACA territory infarct Vascular: No hyperdense vessel. Skull: Left suboccipital craniectomy Sinuses/Orbits: Negative Other: These results were called by telephone at the time of interpretation on 01/07/2019 at 7:07 am to Dr. Lorraine Lax who verbally acknowledged these results. IMPRESSION: 1. Right ACA and PCA territory acute infarcts with small volume right frontal hemorrhage. 2. Stable multifocal intracranial hemorrhage as described. 3. Right frontal drain with normal ventricular volume. Electronically Signed   By: Monte Fantasia M.D.   On: 01/07/2019 07:12   Ct Head Wo Contrast  Result Date: 01/06/2019 CLINICAL DATA:  Intracranial hemorrhage follow up EXAM: CT HEAD WITHOUT CONTRAST TECHNIQUE: Contiguous axial images were obtained from the base of the skull through the vertex without intravenous contrast. COMPARISON:  None. FINDINGS: Brain: Unchanged volume of intraventricular hemorrhage with small amount of residual blood products over both convexities. Hematoma evacuation cavity in the left cerebellum is unchanged. No midline shift or mass effect. Slightly decreased size of the lateral ventricles. Pneumocephalus has partially resolved. The right frontal roach EVD is unchanged. Vascular: Atherosclerotic calcification of the vertebral and internal carotid arteries at the skull base. No abnormal hyperdensity of the major intracranial arteries or dural venous sinuses. Skull: There is no disc signal abnormality. There is a intermediate size central disc protrusion at T8-9 effaces the ventral thecal sac without causing spinal canal stenosis. Sinuses/Orbits: No fluid levels  or advanced mucosal thickening of the visualized  paranasal sinuses. No mastoid or middle ear effusion. The orbits are normal. IMPRESSION: Slight decrease in size the lateral ventricles and mildly decreased pneumocephalus. Otherwise unchanged examination. Electronically Signed   By: Ulyses Jarred M.D.   On: 01/06/2019 01:48    Assessment/Plan: CT head stable this morning since EVD being clamped for the last 48 hours. ICP 6 with good waveform. D/c EVD today.    LOS: 8 days    Ocie Cornfield Talley Casco 01/07/2019, 9:02 AM

## 2019-01-07 NOTE — Progress Notes (Signed)
Bedside EEG completed, Dr Leonie Man reviewed.  Results pending.

## 2019-01-07 NOTE — Progress Notes (Signed)
STROKE TEAM PROGRESS NOTE   INTERVAL HISTORY Patient had a very brief episode of right facial twitching last night.  Stat CT scan was obtained early this morning which showed stable appearance of the right ACA and PCA infarcts and hemorrhage without any acute finding.  The ventricular size was normal.  EEG was ordered and is currently being done.  Patient had ventriculostomy catheter removed earlier today by neurosurgery.  He remains drowsy but can be aroused with some difficulty and follow simple commands on the right side.  He has not had any further witnessed seizure activity today his valproic acid level is low normal at 50  Vitals:   01/07/19 0600 01/07/19 0700 01/07/19 0719 01/07/19 0800  BP: 115/71 126/75 126/75 (!) 123/98  Pulse: 72 (!) 169 94 (!) 51  Resp: 19 (!) 23 (!) 27 (!) 23  Temp:      TempSrc:      SpO2: 97% 98% 99% 98%  Weight:      Height:        CBC:  Recent Labs  Lab 01/06/19 0824 01/07/19 0415  WBC 21.2* 24.2*  HGB 11.7* 11.8*  HCT 35.8* 36.0*  MCV 93.0 92.5  PLT 150 146*    Basic Metabolic Panel:  Recent Labs  Lab 01/04/19 0518  01/05/19 0550  01/06/19 0824 01/07/19 0415  NA 151*   < > 155*   < > 152* 153*  K 3.5   < > 3.7  --  3.8 3.7  CL 121*  --  125*  --  120* 120*  CO2 20*  --  21*  --  21* 22  GLUCOSE 162*  --  159*  --  181* 189*  BUN 53*  --  63*  --  62* 63*  CREATININE 1.15  --  1.17  --  1.18 1.18  CALCIUM 8.3*  --  8.2*  --  7.9* 7.7*  MG 2.5*  --  2.7*  --   --   --   PHOS  --   --  2.9  --   --   --    < > = values in this interval not displayed.   IMAGING Ct Head Wo Contrast 01/07/2019 1. Right ACA and PCA territory acute infarcts with small volume right frontal hemorrhage. 2. Stable multifocal intracranial hemorrhage as described. 3. Right frontal drain with normal ventricular volume.  01/06/2019 Slight decrease in size the lateral ventricles and mildly decreased pneumocephalus. Otherwise unchanged examination.  01/04/2019   resolution of right-sided subdural hematoma and midline shift with postoperative changes.  Right ventricular catheter in optimal position.  Unchanged appearance of left cerebellar hematoma evacuation.  Dg Chest Port 1 View 01/05/2019 1. Stable hardware positioning. 2. Improved left base aeration.  EEG  01/07/2019 No ongoing sz per Leonie Man review.  Brief 2-second run of irritability.  Formal reading pending  01/06/2019 This EEG shows evidence of cerebral dysfunction maximal in the right hemisphere and a severe diffuse encephalopathy. No epileptiform discharges or EEG seizures were recorded.  01/05/2019 This is an abnormal EEG.  There is evidence of a seizure tendency emanating from the right central area of the brain with an electrographic seizure emanating from the right frontocentral area of the brain.     PHYSICAL EXAM:  General - Well nourished, well developed, elderly Caucasian male who is intubated sedated Ophthalmologic - fundi not visualized due to noncooperation.  Cardiovascular - Regular rate and rhythm.  Neuro -sedated intubated.  Eyes are closed.  Right pupil 4 mm not reactive.  Left 3 mm reactive.  Patient is sleepy but can be aroused with some difficulty and is able to follow simple commands and midline as well as right upper and lower extremity Very weak cough and gag.   Dense left hemiplegia with hypotonia.   Plantars not elicitable bilaterally.   ASSESSMENT/PLAN [name] is a 73 y.o. male with history of tobacco abuse, and pulmonary embolus, paroxysmal atrial fibrillation, hypertension, alcohol abuse with history of coagulopathy in setting of Xarelto from prior alcoholism presenting with dizziness, diaphoresis and bradycardia. Initially felt to be cardiac but cerebellar bleed found on imaging.   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 Developed hydrocephalus s/p EVD, the resultant R SDH  requiring surgical evacuation  Neurosurgery consult Dr. Ronnald Ramp, post suboccipital Craniectomy 12/23/2018.Marland Kitchen Neurological worsening due to hydrocephalus 01/02/2019 requiring emergent ventriculostomy followed by further neurological worsening due to acute right subdural hematoma requiring emergent right craniotomy for hematoma evacuation  CT head 6/17 left cerebellar hemorrhage 28 mils with IVH.  Posterior fossa without hydrocephalus  CTA head motion degraded.  No gross AVM or aneurysm  CT head 6/18 L suboccipital craniotomy, evacuation L cerebellar hemorrhage.  Decreased mass-effect on fourth ventricle and posterior fossa.  IVH bilaterally.  Minimal pneumocephalus.  Atherosclerosis.  CT Head - 6/19 - Stable volume of intracranial hemorrhage that is mainly intraventricular.   CT Head - 6/21 3086 - interval worsening lateral and 3rd vent hydrocephalus. Stable L cerebellar hmg evacuation  CT Head - 6/21 0715 - interval development large R SDH 45mm thick w/ 34mm shift. EVD placement with decompression of ventricles. Postop L occipital crani stable.  CT repeat 6/22 0321 stable hematoma s/p evacuation and IVH, no hydrocephalus but right hemisphere developing hygroma  CT head 6/24 slight decrease in ventricle size  CT head 6/25 stable  Consider MRI to rule out hemorrhagic infarct once able to lay flat longer   2D Echo EF 50 to 55%  LDL 48  HgbA1c 6.1  SCDs for VTE prophylaxis  aspirin 81 mg daily and Xarelto (rivaroxaban) daily prior to admission, now on No antithrombotic given hemorrhage.   Therapy recommendations:  CIR   Disposition:  pending   Poor prognosis. Continued aggressive care per family request. Family does not want long term trach and PEG.   Cerebral Edema - Induced Hypernatremia   Treated with 3%, now off  Off dexamethasone today   Na 153  On free water 300 q6h  Check Na daily  Hydrocephalus and right hemisphere hygroma   EVD placement   Neurological worsening  due to hydrocephalus 01/02/2019 requiring emergent ventriculostomy followed by further neurological worsening due to acute right subdural hematoma requiring emergent right craniotomy for hematoma evacuation  Put on ancef 6/21>>  CT 6/22 stable post evacuation  EVD clamped 6/23  CT 6/24 decreasing ventricle size  CT 6/25 stable  EVD out 6/25  D/c decadron  New onset seizure  Started 6/23 0929 after EVD clamped  Treated w/ versed  Loaded with Keppra and valproic acid   EEG w/ seizure  LT EEG w/ R sided dysfunction, no seizure. D/c LT EEG  D/c keppra  R eye twitching x 45 sec over night  Repeat EEG no sz (no need for LT EEG)  Increase valproic acid dose to 1000 q 8h on 01/06/2019  Acute Hypoxic Respiratory failure / VDRF  Secondary to stroke   Intubated in ED  Extubated 12/31/18  Tolerating well  Continue to have copious secretions  On 3% neb Q6h while awake  NT suction  Post CABG Atrial Fibrillation  CABG 06/2018  Home anticoagulation:  Xarelto (rivaroxaban) daily   Reversed with [place]  AC on hold d/t bleed  Hypertensive emergency  Home meds: Cozaar 100, Toprol 50  Systolic blood pressure greater than 200s with hemorrhage   Treated with Cleviprex   On norvasc 2.5, losartan 100, metoprolol 50 bid; PRN hydralazine 20 q6h  SBP goal <160   Currently stable  Restlessness  Developed restlessness after extubation  Put on Precedex, now off  Close monitoring  Hyperlipidemia  Home meds: Lipitor 80  LDL 48, at goal < 70  Hold statin given hemorrhage  Consider continuation of statin lower dose at discharge  Hyperglycemia  HgbA1c 6.1  On novolog 3u q4h  SSI  Dysphagia   Secondary to stroke   Currently n.p.o.   Speech following  Cortrak placed 01/01/2019  Now on tube feeds  Leukocytosis   WBC (on Decadron)->15.0->21.1->24.2  TMax 101  UA - 01/01/2019 - normal  CXR 6/23 okay  D/c decadron  Other Stroke Risk  Factors  Advanced age  Former smoker, quit smoking 6 months ago  History of alcohol abuse, last drink 10 years ago. LFTs normal  Coronary artery disease, NSTEMI s/p CABG, December 2019  Other Active Problems  Mildly elevated troponin 0.05, reactionary  Anemia 11.8  Thrombocytopenia 146  Lethargic since Sat. Avoiding amantadine given seizures. Will follow.  Hospital day # 8 The patient continues to have depressed mental status which is worrisome.  This may be result of seizures or seizure medications.  Even though he may wean on the ventilator he may not be able to protect his airway.  Recommend increased dose of valproic acid to 1 gm 3 times daily and repeat level in the morning.  Await formal EEG reading.  Hopefully patient begins to wake up and is able to be extubated over the next few days.  Family is clear that they do not want prolonged ventilatory support or tracheostomy.  His prognosis remains guarded.  Discussed with Dr. Nelda Marseille critical care medicine. This patient is critically ill and at significant risk of neurological worsening, death and care requires constant monitoring of vital signs, hemodynamics,respiratory and cardiac monitoring, extensive review of multiple databases, frequent neurological assessment, discussion with family, other specialists and medical decision making of high complexity.I have made any additions or clarifications directly to the above note.This critical care time does not reflect procedure time, or teaching time or supervisory time of PA/NP/Med Resident etc but could involve care discussion time.  I spent 30 minutes of neurocritical care time  in the care of  this patient.     Antony Contras, MD  To contact Stroke Continuity provider, please refer to [email or web address] After hours, contact General Neurology

## 2019-01-07 NOTE — Progress Notes (Signed)
Pt. Had right eye twitching that lasted only 45 seconds - notified neurology  Pt not following commands and has abnormal extension posturing on both arms when stimulated. Repeat CT done, awaiting results. Vital signs stable.   Will continue to monitor.   Leticia Clas, RN BSN

## 2019-01-07 NOTE — Progress Notes (Signed)
[place] Progress Note Patient Name: Pinchos Topel DOB: 1945-09-30 MRN: [id number]   Date of Service  01/07/2019  HPI/Events of Note  Hypotension with MAP in the 50,s. Pt received 2 doses of 40 mg of Lasix over the course of the day and is -1300. He had Ancef infusing but has been on it since 6/21 making anaphylaxis less likely  eICU Interventions  NS 500 ml iv fluid bolus, Phenylephrine infusion wean as tolerated to maintain SBP < 140 and MAP of 70, arterial line for closer monitoring of BP        [place]wo U Ogan 01/07/2019, 9:42 PM

## 2019-01-07 NOTE — Progress Notes (Signed)
NAME:  Carrie Schoonmaker, MRN:  [id number], DOB:  [birth year], LOS: 8 ADMISSION DATE:  01/01/2019, CONSULTATION DATE: June 17 REFERRING MD: Dr. Rory Percy, CHIEF COMPLAINT: Intraparenchymal hemorrhage  Brief History   73 year old male anticoagulated with Xarelto presented with intraparenchymal hematoma and was emergently taken to the OR for evacuation.  Past Medical History  Tobacco Abuse , PE , PAF - on Xarelto, NSTEMI , HTN ,ETOH Abuse   Significant Hospital Events   6/17 Admit with cerebellar ICH. To OR for evacuation. Out to ICU intubated.  6/17->6/22: extubated 6/18, had mild hydrocephalus, increased edema, decadron taper,  started on hypertonic saline, agitated started on precedex, on cleviprex to keep sbp < 160. 6/21 decreased LOC, intubated for airway protection, CT head w/ obstructive hydrocephalus. Right frontal IVC placed. Episodes of bradycardia. New right sided SDH felt 2/2 ICV placement. Went to OR again for right crani and placement of subdural drain.  6/22: Subdural drain removed, Seizure-like activity (involuntary twitching), EEG started, Clamped IVC per neurosurgery   6/23 not much change. MS a little worse.  6/24 ON new seizure like activity, EEG restarted 6/25 EVD d/c  Consults:  Neurosurgery PCCM  Procedures:  ETT 6/17 > 6/18 ETT 6/21 >>  Art line 6/17 > 6/18 Ventriculostomy 6/21 >> Clamped 6/23 >> d/c 6/25  Significant Diagnostic Tests:  CT head 6/17: Acute intraparenchymal hemorrhage within the left cerebellum with volume of 28 mL and intraventricular extension to the third and fourth ventricles. 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 CTA head 6/17: Severely motion degraded study. No proximal large vessel occlusion of the anterior circulation. The hemorrhage pattern of the left cerebellum is most consistent with hypertensive hemorrhage. CT head 6/18: L suboccipital craniotomy,  evacuation L cerebellar hemorrhage. Decreased mass-effect on fourth ventricle and posterior fossa. IVH bilaterally. Minimal pneumocephalus. Atherosclerosis ECHO 6/18: Severe akinesis of the left ventricular, mid-apical apical segment and lateral wall, LVEF ~ 50-55%, No RWMA, normal RV systolic function, moderate MV regurgitation CT head 6/22: 1. Status post right pterional craniotomy for decompression of right-sided subdural hematoma. 2. Resolution of midline shift and ventricular entrapment. Markedly decreased size of subdural hematoma over the right convexity. 3. Unchanged appearance of left cerebellar hemorrhage of actuation cavity. Unchanged volume of blood over the left convexity. 4. Moderate pneumocephalus. EEG 6/23: Evidence of cerebral dysfunction maximal in the right hemisphere and a severe diffuse encephalopathy. No epileptiform discharges or EEG seizures were recorded.  CT head 6/24: Slightly decrease in size the lateral ventricles and mildly decreased pneumocephalus while EVD clamped for 24 hours. Otherwise unchanged examination.  Micro Data:  6/17 SARS-CoV-2 > neg  Antimicrobials:  6/17 Periop cefazolin >>  Interim history/subjective:  Remains intubated, new seizure-like activity overnight  Objective   Blood pressure (Abnormal) 121/58, pulse 99, temperature 99.2 F (37.3 C), resp. rate (Abnormal) 24, height 5\' 4"  (1.626 m), weight 68.1 kg, SpO2 98 %.    Vent Mode: PSV;CPAP FiO2 (%):  [30 %] 30 % Set Rate:  [15 bmp] 15 bmp PEEP:  [5 cmH20] 5 cmH20 Pressure Support:  [5 cmH20] 5 cmH20 Plateau Pressure:  [12 cmH20-15 cmH20] 14 cmH20   Intake/Output Summary (Last 24 hours) at 01/07/2019 0955 Last data filed at 01/07/2019 0800 Gross per 24 hour  Intake 2022.96 ml  Output 1750 ml  Net 272.96 ml   Filed Weights   12/19/2018 1352 01/08/2019 2245  Weight: 68 kg 68.1 kg    Examination: General: 73 year  old. Well nourished male HENT: [place]/AT, EVD clamped. ETT in place. Enteric tube  in place Pulm: Intubated. Diminished throughout Card: RRR, no murmurs, gallops, rubs.  Abd: Soft, nontender, present bowel sounds x4 Neuro: Sedated, intubated. Right pupil non-reactive, intermittent follows simple commands with RUE and RLE. Left hemiplegia with decorticate movement GU: Clear to yellow. Adequate urine output Ext: No edema, brisk cap refill. Staples approximated on head  Resolved Hospital Problem list     Assessment & Plan:   Cerebellar Intraparenchymal Hemorrhage, evacuated 6/17 Acute progression of hydrocephalus 6/21 requiring ventriculostomy placement Acute right subdural hematoma s/p evacuation 6/21  Plan  Per neuro, poor prognosis. Continue aggressive care per family request. Family does not want tracheostomy.   Seizure-like activity  Plan Valproic acid  Restarted EEG Defer to Neuro   Acute respiratory failure, VDRF Plan PS as tolerated but no extubation given mental status Titrate O2 for sat of 88-92% VAP prevention protocol   Atrial fibrillation on Xarelto  Plan Tele monitoring Rate control if needed  Hypertension  Plan Continue BP control as ordered  Fluid and electrolyte imbalance:   Plan Increase free water to 300q4 Lasix 40 mg IV q8 x2 doses Zaroxolyn x1 dose BMET in AM Replace K  Leukocytosis  Plan:  CBC in AM  Anemia   Plan Hold transfusion for now  Hyperglycemia   Plan ISS CBG  History of ETOH Abuse   Plan Thiamine Folate MVI Precedex  Best practice:  Diet: NPO; at goal rate for tubefeeds Pain/Anxiety/Delirium protocol (if indicated): off gtts. PAD protocol in place. PRNs only  VAP protocol (if indicated): 6/21 DVT prophylaxis: SCD GI prophylaxis: PPI Glucose control: SSI Mobility: BR Code Status: FULL Family Communication: Neurology and NSGY have discussed w family 6/21 Disposition:  Remains critically ill. Require CCM service for BP control, mechanical ventilation titration, and titration/  management of fluid, electrolyte and acid-base imbalances. Neuro exam is significant for intermittently following commands on RUE and RLE and localizing pain on LUE and LLE. EEG shows evidence of cerebral dysfunction maximal in the right hemisphere and a severe diffuse encephalopathy. EVD was clamped. Hypernatremia slowly normalizing. BP currently well controlled. New leukocytosis on decadron, patient remains afebrile. Will continue to evaluate over the next few days. Suspect we will be working towards one-way extubation.   Critical care time:   35 min.    Attending Note:  73 year old male with ICH and SDH s/p crani and EVD placement for hydrocephalus.  Overnight, persistent seizure and evaluated by neurology.  Fever to 101.  On exam, unresponsive, postures on the left and some purposeful movement on the right to pain only, not command with clear lungs and weaning.  I reviewed CXR myself, ETT is in a good position.  Discussed with PCCM-NP and bedside RN.  Patient is not extubatable at this time due to mental status.  Begin PS trials and maintain as tolerated.  Lasix 40 q8 x2 doses.  Zaroxolyn x1 dose.  Replace K.  Increase free water to 300 q4.  AM labs ordered.  Neurology to communicate with family today regarding plan of care.  Trach/peg vs comfort measures, will defer to neuro discussion with family.  The patient is critically ill with multiple organ systems failure and requires high complexity decision making for assessment and support, frequent evaluation and titration of therapies, application of advanced monitoring technologies and extensive interpretation of multiple databases.   Critical Care Time devoted to patient care services described in this note is  33  Minutes. This time reflects time of care of this signee Dr Jennet Maduro. This critical care time does not reflect procedure time, or teaching time or supervisory time of PA/NP/Med student/Med Resident etc but could involve care  discussion time.  Rush Farmer, M.D. [facility] Pulmonary/Critical Care Medicine. Pager: [phone]. After hours pager: [phone].

## 2019-01-07 NOTE — Procedures (Signed)
EEG Report  Clinical History:  Right eyelid twitching in setting of ICH/SDH.  Technical Summary:  A 19 channel digital EEG recording was performed using the 10-20 international system of electrode placement.  Bipolar and Referential montages were used.  The total recording time was approx 20 minutes.  Findings:  There is no posterior dominant rhythm.  Background frequencies are about 6 Hz and symmetrical.  There is theta frequency focal slowing in the right frontotemporal areas.  There are recurrent epileptiform discharges with maximal negativity at C4.  On several occasions, these evolve into electrographic seizures in the right hemisphere lasting 10-30 seconds each time.   Impression:  This is an abnormal EEG.  There is evidence of moderate generalized slowing of brain activity more pronounced in the right frontotemporal area and a seizure tendency emanating form the right central area associated with several seizures in the right hemisphere.    Rogue Jury, MS, MD

## 2019-01-07 NOTE — Progress Notes (Signed)
[place] Progress Note Patient Name: Randy [name] DOB: 02-Sep-1945 MRN: [id number]   Date of Service  01/07/2019  HPI/Events of Note  Focal seizures involving right peri-orbital muscles  eICU Interventions  Ativan prn seizures, ? Vimpat (will defer to neurology), resume cEEG        Shalah Estelle U Dayanna Pryce 01/07/2019, 4:38 AM

## 2019-01-07 NOTE — Progress Notes (Signed)
R isolated eyelid twitching. Myokymia vs simple partial sz. Will repeat EEG today, CT head. Continue AEDS for now.   Roland Rack, MD [facility] [phone]  If 7pm- 7am, please page neurology on call as listed in [place].

## 2019-01-08 ENCOUNTER — Inpatient Hospital Stay (HOSPITAL_COMMUNITY): Payer: Medicare Other

## 2019-01-08 DIAGNOSIS — J9601 Acute respiratory failure with hypoxia: Secondary | ICD-10-CM

## 2019-01-08 LAB — CBC WITH DIFFERENTIAL/PLATELET
Abs Immature Granulocytes: 0.9 10*3/uL — ABNORMAL HIGH (ref 0.00–0.07)
Basophils Absolute: 0.1 10*3/uL (ref 0.0–0.1)
Basophils Relative: 0 %
Eosinophils Absolute: 0 10*3/uL (ref 0.0–0.5)
Eosinophils Relative: 0 %
HCT: 31.6 % — ABNORMAL LOW (ref 39.0–52.0)
Hemoglobin: 10.2 g/dL — ABNORMAL LOW (ref 13.0–17.0)
Immature Granulocytes: 3 %
Lymphocytes Relative: 4 %
Lymphs Abs: 1.4 10*3/uL (ref 0.7–4.0)
MCH: 30 pg (ref 26.0–34.0)
MCHC: 32.3 g/dL (ref 30.0–36.0)
MCV: 92.9 fL (ref 80.0–100.0)
Monocytes Absolute: 2.3 10*3/uL — ABNORMAL HIGH (ref 0.1–1.0)
Monocytes Relative: 7 %
Neutro Abs: 27.4 10*3/uL — ABNORMAL HIGH (ref 1.7–7.7)
Neutrophils Relative %: 86 %
Platelets: 159 10*3/uL (ref 150–400)
RBC: 3.4 MIL/uL — ABNORMAL LOW (ref 4.22–5.81)
RDW: 15.6 % — ABNORMAL HIGH (ref 11.5–15.5)
WBC: 31.9 10*3/uL — ABNORMAL HIGH (ref 4.0–10.5)
nRBC: 0.3 % — ABNORMAL HIGH (ref 0.0–0.2)

## 2019-01-08 LAB — POCT I-STAT 7, (LYTES, BLD GAS, ICA,H+H)
Acid-Base Excess: 1 mmol/L (ref 0.0–2.0)
Bicarbonate: 22.9 mmol/L (ref 20.0–28.0)
Calcium, Ion: 1.16 mmol/L (ref 1.15–1.40)
HCT: 34 % — ABNORMAL LOW (ref 39.0–52.0)
Hemoglobin: 11.6 g/dL — ABNORMAL LOW (ref 13.0–17.0)
O2 Saturation: 98 %
Patient temperature: 100.4
Potassium: 3.7 mmol/L (ref 3.5–5.1)
Sodium: 150 mmol/L — ABNORMAL HIGH (ref 135–145)
TCO2: 24 mmol/L (ref 22–32)
pCO2 arterial: 29.1 mmHg — ABNORMAL LOW (ref 32.0–48.0)
pH, Arterial: 7.507 — ABNORMAL HIGH (ref 7.350–7.450)
pO2, Arterial: 106 mmHg (ref 83.0–108.0)

## 2019-01-08 LAB — VALPROIC ACID LEVEL: Valproic Acid Lvl: 84 ug/mL (ref 50.0–100.0)

## 2019-01-08 LAB — BASIC METABOLIC PANEL
Anion gap: 9 (ref 5–15)
BUN: 58 mg/dL — ABNORMAL HIGH (ref 8–23)
CO2: 22 mmol/L (ref 22–32)
Calcium: 7.2 mg/dL — ABNORMAL LOW (ref 8.9–10.3)
Chloride: 119 mmol/L — ABNORMAL HIGH (ref 98–111)
Creatinine, Ser: 1.28 mg/dL — ABNORMAL HIGH (ref 0.61–1.24)
GFR calc Af Amer: >60 mL/min (ref >60)
GFR calc non Af Amer: 56 mL/min — ABNORMAL LOW (ref >60)
Glucose, Bld: 136 mg/dL — ABNORMAL HIGH (ref 70–99)
Potassium: 3.6 mmol/L (ref 3.5–5.1)
Sodium: 150 mmol/L — ABNORMAL HIGH (ref 135–145)

## 2019-01-08 LAB — GLUCOSE, CAPILLARY
Glucose-Capillary: 107 mg/dL — ABNORMAL HIGH (ref 70–99)
Glucose-Capillary: 108 mg/dL — ABNORMAL HIGH (ref 70–99)
Glucose-Capillary: 111 mg/dL — ABNORMAL HIGH (ref 70–99)
Glucose-Capillary: 112 mg/dL — ABNORMAL HIGH (ref 70–99)
Glucose-Capillary: 113 mg/dL — ABNORMAL HIGH (ref 70–99)
Glucose-Capillary: 118 mg/dL — ABNORMAL HIGH (ref 70–99)

## 2019-01-08 LAB — MAGNESIUM: Magnesium: 2.7 mg/dL — ABNORMAL HIGH (ref 1.7–2.4)

## 2019-01-08 LAB — PHOSPHORUS: Phosphorus: 3.5 mg/dL (ref 2.5–4.6)

## 2019-01-08 MED ORDER — LEVETIRACETAM IN NACL 1000 MG/100ML IV SOLN
1000.0000 mg | Freq: Once | INTRAVENOUS | Status: AC
  Administered 2019-01-08: 1000 mg via INTRAVENOUS

## 2019-01-08 MED ORDER — SODIUM CHLORIDE 0.9 % IV SOLN
100.0000 mg | Freq: Two times a day (BID) | INTRAVENOUS | Status: DC
  Administered 2019-01-08 – 2019-01-10 (×4): 100 mg via INTRAVENOUS
  Filled 2019-01-08 (×5): qty 10

## 2019-01-08 MED ORDER — VITAL AF 1.2 CAL PO LIQD
1000.0000 mL | ORAL | Status: DC
  Administered 2019-01-08 (×2): 1000 mL

## 2019-01-08 MED ORDER — LEVETIRACETAM IN NACL 1500 MG/100ML IV SOLN
1500.0000 mg | Freq: Two times a day (BID) | INTRAVENOUS | Status: DC
  Administered 2019-01-08 – 2019-01-10 (×4): 1500 mg via INTRAVENOUS
  Filled 2019-01-08 (×3): qty 100

## 2019-01-08 MED ORDER — SODIUM CHLORIDE 0.9 % IV SOLN
200.0000 mg | Freq: Once | INTRAVENOUS | Status: AC
  Administered 2019-01-08: 200 mg via INTRAVENOUS
  Filled 2019-01-08: qty 20

## 2019-01-08 MED ORDER — LEVETIRACETAM IN NACL 1500 MG/100ML IV SOLN: 1500.0000 mg | Freq: Two times a day (BID) | INTRAVENOUS | Status: DC

## 2019-01-08 NOTE — Progress Notes (Signed)
Subjective: Patient intubated and sedated still  Objective: Vital signs in last 24 hours: Temp:  [99.3 F (37.4 C)-100.4 F (38 C)] 99.7 F (37.6 C) (06/26 0400) Pulse Rate:  [73-111] 96 (06/26 0734) Resp:  [15-34] 32 (06/26 0734) BP: (70-166)/(52-96) 155/59 (06/26 0734) SpO2:  [96 %-100 %] 99 % (06/26 0700) Arterial Line BP: (58-145)/(31-66) 121/54 (06/26 0700) FiO2 (%):  [30 %] 30 % (06/26 0735)  Intake/Output from previous day: 06/25 0701 - 06/26 0700 In: 2676.6 [I.V.:729.8; NG/GT:1375; IV Piggyback:571.8] Out: 3050 [Urine:3050] Intake/Output this shift: No intake/output data recorded.  Neurologic: withdraws to noxious stimuli  Lab Results: Lab Results  Component Value Date   WBC 24.2 (H) 01/07/2019   HGB 11.6 (L) 01/08/2019   HCT 34.0 (L) 01/08/2019   MCV 92.5 01/07/2019   PLT 146 (L) 01/07/2019   Lab Results  Component Value Date   INR 1.2 12/31/2018   BMET Lab Results  Component Value Date   NA 150 (H) 01/08/2019   K 3.7 01/08/2019   CL 120 (H) 01/07/2019   CO2 22 01/07/2019   GLUCOSE 189 (H) 01/07/2019   BUN 63 (H) 01/07/2019   CREATININE 1.18 01/07/2019   CALCIUM 7.7 (L) 01/07/2019    Studies/Results: Ct Head Wo Contrast  Result Date: 01/07/2019 CLINICAL DATA:  Muscle weakness. Right-sided facial twitching and left upper extremity posturing EXAM: CT HEAD WITHOUT CONTRAST TECHNIQUE: Contiguous axial images were obtained from the base of the skull through the vertex without intravenous contrast. COMPARISON:  Yesterday and multiple priors dating back to 12/19/2018 admission head CT FINDINGS: Brain: Extensive cytotoxic edema in the parasagittal right frontal, parietal, and occipital lobes. There is local mass effect without midline shift. Left cerebellar hemorrhage and evacuation. Small volume residual without progression. No increasing ventricular clot or ventricular volume, a right frontal shunt is present. Mild high-density thickening of the posterior falx  and right tentorium attributed to subdural hematoma, without mass effect. There is extra-axial hemorrhage about the right pterional flap measuring up to 6 mm, stable. Pneumocephalus. Small volume parenchymal hemorrhage along the anterior aspect of the ACA territory infarct Vascular: No hyperdense vessel. Skull: Left suboccipital craniectomy Sinuses/Orbits: Negative Other: These results were called by telephone at the time of interpretation on 01/07/2019 at 7:07 am to Dr. Lorraine Lax who verbally acknowledged these results. IMPRESSION: 1. Right ACA and PCA territory acute infarcts with small volume right frontal hemorrhage. 2. Stable multifocal intracranial hemorrhage as described. 3. Right frontal drain with normal ventricular volume. Electronically Signed   By: Monte Fantasia M.D.   On: 01/07/2019 07:12    Assessment/Plan: Patient likely neuro storming. Otherwise unchanged. No new recom.    LOS: 9 days    Ocie Cornfield Joleigh Mineau 01/08/2019, 8:00 AM

## 2019-01-08 NOTE — Progress Notes (Signed)
STROKE TEAM PROGRESS NOTE   INTERVAL HISTORY Patient's EEG showed focal seizure in the R hemisphere despite being on keppra and increased dose of valproate. Remains intubated. Experienced hypotensive episodes overnight with MAP in 50s, A-line placed, currently requiring Neo gtt @ 80 to maintain BP goal but has episodes of SBPs over 200 without Neo.   Patient continues to remain drowsy and difficult to arouse.exam has worsened No longer following commands, minimal withdrawal to pain on BUE.   Vitals:   01/08/19 0837 01/08/19 0845 01/08/19 0900 01/08/19 0915  BP: 95/62 104/61 92/61 109/73  Pulse: 89 94 90 92  Resp: (!) 32 (!) 36 (!) 30 (!) 29  Temp:      TempSrc:      SpO2: 98% 99% 98% 98%  Weight:      Height:        CBC:  Recent Labs  Lab 01/06/19 0824 01/07/19 0415 01/08/19 0247  WBC 21.2* 24.2*  --   HGB 11.7* 11.8* 11.6*  HCT 35.8* 36.0* 34.0*  MCV 93.0 92.5  --   PLT 150 146*  --     Basic Metabolic Panel:  Recent Labs  Lab 01/05/19 0550  01/07/19 0415 01/08/19 0247 01/08/19 0855  NA 155*   < > 153* 150* 150*  K 3.7   < > 3.7 3.7 3.6  CL 125*   < > 120*  --  119*  CO2 21*   < > 22  --  22  GLUCOSE 159*   < > 189*  --  136*  BUN 63*   < > 63*  --  58*  CREATININE 1.17   < > 1.18  --  1.28*  CALCIUM 8.2*   < > 7.7*  --  7.2*  MG 2.7*  --   --   --  2.7*  PHOS 2.9  --   --   --  3.5   < > = values in this interval not displayed.   IMAGING Ct Head Wo Contrast 01/07/2019 1. Right ACA and PCA territory acute infarcts with small volume right frontal hemorrhage. 2. Stable multifocal intracranial hemorrhage as described. 3. Right frontal drain with normal ventricular volume.  01/06/2019 Slight decrease in size the lateral ventricles and mildly decreased pneumocephalus. Otherwise unchanged examination.  01/04/2019  resolution of right-sided subdural hematoma and midline shift with postoperative changes.  Right ventricular catheter in optimal position.  Unchanged  appearance of left cerebellar hematoma evacuation.  Dg Chest Port 1 View 01/05/2019 1. Stable hardware positioning. 2. Improved left base aeration.  EEG   01/08/2019 Focal seizures seen in the R hemisphere.   01/07/2019 No ongoing sz per Leonie Man review.  Brief 2-second run of irritability.  Formal reading pending  01/06/2019 This EEG shows evidence of cerebral dysfunction maximal in the right hemisphere and a severe diffuse encephalopathy. No epileptiform discharges or EEG seizures were recorded.  01/05/2019 This is an abnormal EEG.  There is evidence of a seizure tendency emanating from the right central area of the brain with an electrographic seizure emanating from the right frontocentral area of the brain.     PHYSICAL EXAM:  General - Well nourished, well developed, elderly Caucasian male who is intubated   Ophthalmologic - fundi not visualized due to noncooperation.  Cardiovascular - Regular rate and rhythm.  Neuro- intubated.  Eyes are closed.   Patient is unresponsive does not arouse to voice or pain. Minimally withdraws on BUE to pain. Not following commands.  ASSESSMENT/PLAN Randy [name] is a 73 y.o. male with history of tobacco abuse, and pulmonary embolus, paroxysmal atrial fibrillation, hypertension, alcohol abuse with history of coagulopathy in setting of Xarelto from prior alcoholism presenting with dizziness, diaphoresis and bradycardia. Initially felt to be cardiac but cerebellar bleed found on imaging.   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 Developed hydrocephalus s/p EVD, the resultant R SDH requiring surgical evacuation  Neurosurgery consult Dr. Ronnald Ramp, post suboccipital Craniectomy 12/18/2018.Marland Kitchen Neurological worsening due to hydrocephalus 01/02/2019 requiring emergent ventriculostomy followed by further neurological worsening due to acute right subdural hematoma requiring emergent right  craniotomy for hematoma evacuation  CT head 6/17 left cerebellar hemorrhage 28 mils with IVH.  Posterior fossa without hydrocephalus  CTA head motion degraded.  No gross AVM or aneurysm  CT head 6/18 L suboccipital craniotomy, evacuation L cerebellar hemorrhage.  Decreased mass-effect on fourth ventricle and posterior fossa.  IVH bilaterally.  Minimal pneumocephalus.  Atherosclerosis.  CT Head - 6/19 - Stable volume of intracranial hemorrhage that is mainly intraventricular.   CT Head - 6/21 1696 - interval worsening lateral and 3rd vent hydrocephalus. Stable L cerebellar hmg evacuation  CT Head - 6/21 0715 - interval development large R SDH 35mm thick w/ 67mm shift. EVD placement with decompression of ventricles. Postop L occipital crani stable.  CT repeat 6/22 0321 stable hematoma s/p evacuation and IVH, no hydrocephalus but right hemisphere developing hygroma  CT head 6/24 slight decrease in ventricle size  CT head 6/25 stable  Consider MRI to rule out hemorrhagic infarct once able to lay flat longer   2D Echo EF 50 to 55%  LDL 48  HgbA1c 6.1  SCDs for VTE prophylaxis  aspirin 81 mg daily and Xarelto (rivaroxaban) daily prior to admission, now on No antithrombotic given hemorrhage.   Therapy recommendations:  CIR   Disposition:  pending   Poor prognosis. Continued aggressive care per family request. Family does not want long term trach and PEG.   6/26 Repeat CTH for decreased responsiveness- unchanged  Cerebral Edema - Induced Hypernatremia   Treated with 3%, now off  Off dexamethasone 6/25  Na 150  On free water 300 q6h  Check Na daily  Hydrocephalus and right hemisphere hygroma   EVD placement   Neurological worsening due to hydrocephalus 01/02/2019 requiring emergent ventriculostomy followed by further neurological worsening due to acute right subdural hematoma requiring emergent right craniotomy for hematoma evacuation  Put on ancef 6/21>>  CT 6/22  stable post evacuation  EVD clamped 6/23  CT 6/24 decreasing ventricle size  CT 6/25 stable  EVD out 6/25  D/c decadron  New onset seizure  Started 6/23 0929 after EVD clamped  Treated w/ versed  Loaded with Keppra and valproic acid   EEG w/ seizure  LT EEG w/ R sided dysfunction, no seizure. D/c LT EEG  D/c keppra  R eye twitching x 45 sec over night  Repeat EEG no sz (no need for LT EEG)  Increase valproic acid dose to 1000 q 8h on 01/06/2019  EEG showed R hemispheric seizures on 6/25  F/u EEG  STAT valproic acid levels  Start vimpat 200 mg IV load, 100 mg BID after for maintenance  Acute Hypoxic Respiratory failure / VDRF  Secondary to stroke   Intubated in ED   Extubated 12/31/18  Tolerating well  Continue to have copious secretions  On 3% neb Q6h while awake  NT suction  Continues to be intubated  Post CABG Atrial Fibrillation  CABG 06/2018  Home anticoagulation:  Xarelto (rivaroxaban) daily   Reversed with [place]  AC on hold d/t bleed  Hypertensive emergency  Home meds: Cozaar 100, Toprol 50  Systolic blood pressure greater than 200s with hemorrhage   Treated with Cleviprex   On norvasc 2.5, losartan 100, metoprolol 50 bid; PRN hydralazine 20 q6h  SBP goal <160   Currently hypotensive requiring Neo with episodes of storming  Restlessness  Developed restlessness after extubation  Put on Precedex, now off  Close monitoring  Hyperlipidemia  Home meds: Lipitor 80  LDL 48, at goal < 70  Hold statin given hemorrhage  Consider continuation of statin lower dose at discharge  Hyperglycemia  HgbA1c 6.1  On novolog 3u q4h  SSI  Dysphagia   Secondary to stroke   Currently n.p.o.   Speech following  Cortrak placed 01/01/2019  Now on tube feeds  Leukocytosis   WBC (on Decadron)->15.0->21.1->24.2  TMax 101  UA - 01/01/2019 - normal  CXR 6/23 okay  D/c decadron 6/25  Other Stroke Risk  Factors  Advanced age  Former smoker, quit smoking 6 months ago  History of alcohol abuse, last drink 10 years ago. LFTs normal  Coronary artery disease, NSTEMI s/p CABG, December 2019  Other Active Problems  Mildly elevated troponin 0.05, reactionary  Anemia 11.8  Thrombocytopenia 146  Lethargic since Sat. Avoiding amantadine given seizures. Will follow.  Hospital day # 9   The patient continues to have depressed mental status which is worrisome.  This may be result of seizures or seizure medications.  Even though he may wean on the ventilator he may not be able to protect his airway. Follow up valproic acid levels. Load with vimpat and continue for added seizure suppression.Repeat Ct head today again shows stable appearance of hemorrhage and ventricular size without any worsening Family is clear that they do not want prolonged ventilatory support or tracheostomy.  His prognosis remains guarded.  Discussed with Dr Maryjean Ka CCM attending. If patient doesn't improve in the next few days, will discuss with family about moving to comfort care. I spoke to his son and brother and updated them and answered questions. They are in agreement that if does not show significant improvement over next few days they will go the comfort care route This patient is critically ill and at significant risk of neurological worsening, death and care requires constant monitoring of vital signs, hemodynamics,respiratory and cardiac monitoring, extensive review of multiple databases, frequent neurological assessment, discussion with family, other specialists and medical decision making of high complexity.I have made any additions or clarifications directly to the above note.This critical care time does not reflect procedure time, or teaching time or supervisory time of PA/NP/Med Resident etc but could involve care discussion time.  I spent 35 minutes of neurocritical care time  in the care of  this patient.   Antony Contras, MD  To contact Stroke Continuity provider, please refer to [email or web address] After hours, contact General Neurology

## 2019-01-08 NOTE — Progress Notes (Signed)
Patient transported to CT and returned to 4N32. No complications. Vitals stable at this time. RT will continue to monitor.

## 2019-01-08 NOTE — Procedures (Signed)
Arterial Catheter Insertion Procedure Note Kylil Swopes [id number] April 19, 1946  Procedure: Insertion of Arterial Catheter  Indications: Blood pressure monitoring  Procedure Details Consent: Unable to obtain consent because of emergent medical necessity. Time Out: Verified patient identification, verified procedure, site/side was marked, verified correct patient position, special equipment/implants available, medications/allergies/relevent history reviewed, required imaging and test results available.  Performed  Maximum sterile technique was used including antiseptics, cap, gloves, gown, hand hygiene, mask and sheet. Skin prep: Chlorhexidine; local anesthetic administered 20 gauge catheter was inserted into right radial artery using the Seldinger technique. ULTRASOUND GUIDANCE USED: YES Evaluation Blood flow good; BP tracing good. Complications: No apparent complications.   Marlowe Aschoff 01/08/2019

## 2019-01-08 NOTE — Progress Notes (Addendum)
NAME:  Randy [name], MRN:  [id number], DOB:  Nov 05, 1945, LOS: 9 ADMISSION DATE:  12/23/2018, CONSULTATION DATE: June 17 REFERRING MD: Dr. Rory Percy, CHIEF COMPLAINT: Intraparenchymal hemorrhage  Brief History   73 year old male anticoagulated with Xarelto presented with intraparenchymal hematoma and was emergently taken to the OR for evacuation.  Past Medical History  Tobacco Abuse , PE , PAF - on Xarelto, NSTEMI , HTN ,ETOH Abuse   Significant Hospital Events   6/17 Admit with cerebellar ICH. To OR for evacuation. Out to ICU intubated.  6/17->6/22: extubated 6/18, had mild hydrocephalus, increased edema, decadron taper,  started on hypertonic saline, agitated started on precedex, on cleviprex to keep sbp < 160. 6/21 decreased LOC, intubated for airway protection, CT head w/ obstructive hydrocephalus. Right frontal IVC placed. Episodes of bradycardia. New right sided SDH felt 2/2 ICV placement. Went to OR again for right crani and placement of subdural drain.  6/22: Subdural drain removed, Seizure-like activity (involuntary twitching), EEG started, Clamped IVC per neurosurgery   6/23 not much change. MS a little worse.  6/24 ON new seizure like activity, EEG restarted 6/25 EVD d/c  Consults:  Neurosurgery PCCM  Procedures:  ETT 6/17 > 6/18 ETT 6/21 >>  Art line 6/17 > 6/18 Ventriculostomy 6/21 >> Clamped 6/23 >> d/c 6/25  Significant Diagnostic Tests:  CT head 6/17: Acute intraparenchymal hemorrhage within the left cerebellum with volume of 28 mL and intraventricular extension to the third and fourth ventricles. 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 CTA head 6/17: Severely motion degraded study. No proximal large vessel occlusion of the anterior circulation. The hemorrhage pattern of the left cerebellum is most consistent with hypertensive hemorrhage. CT head 6/18: L suboccipital craniotomy,  evacuation L cerebellar hemorrhage. Decreased mass-effect on fourth ventricle and posterior fossa. IVH bilaterally. Minimal pneumocephalus. Atherosclerosis ECHO 6/18: Severe akinesis of the left ventricular, mid-apical apical segment and lateral wall, LVEF ~ 50-55%, No RWMA, normal RV systolic function, moderate MV regurgitation CT head 6/22: 1. Status post right pterional craniotomy for decompression of right-sided subdural hematoma. 2. Resolution of midline shift and ventricular entrapment. Markedly decreased size of subdural hematoma over the right convexity. 3. Unchanged appearance of left cerebellar hemorrhage of actuation cavity. Unchanged volume of blood over the left convexity. 4. Moderate pneumocephalus. EEG 6/23: Evidence of cerebral dysfunction maximal in the right hemisphere and a severe diffuse encephalopathy. No epileptiform discharges or EEG seizures were recorded.  CT head 6/24: Slightly decrease in size the lateral ventricles and mildly decreased pneumocephalus while EVD clamped for 24 hours. Otherwise unchanged examination.  Micro Data:  6/17 SARS-CoV-2 > neg  Antimicrobials:  6/17 Periop cefazolin >>  Interim history/subjective:  Remains intubated, hypotensive o/n requiring fluid, pressor support. Alteration in neuro exam this a.m.  Objective   Blood pressure 96/61, pulse 92, temperature 99.9 F (37.7 C), temperature source Axillary, resp. rate (!) 26, height 5\' 4"  (1.626 m), weight 68.1 kg, SpO2 100 %.    Vent Mode: CPAP;PSV FiO2 (%):  [30 %] 30 % Set Rate:  [15 bmp] 15 bmp PEEP:  [5 cmH20] 5 cmH20 Pressure Support:  [5 cmH20] 5 cmH20 Plateau Pressure:  [12 cmH20-14 cmH20] 14 cmH20   Intake/Output Summary (Last 24 hours) at 01/08/2019 1051 Last data filed at 01/08/2019 1013 Gross per 24 hour  Intake 3408.55 ml  Output 2725 ml  Net 683.55 ml   Filed Weights   12/21/2018 1352 12/19/2018 2245  Weight: 68  kg 68.1 kg    Examination: General: 73 year old. Well  nourished male HENT: [place], staples from prior crani/EVD placement in place.  ETT in place. Enteric tube in place Pulm: Intubated. BS Diminished throughout Card: RRR, no r/m/g  Abd: Soft, nontender, present bowel sounds x4 Neuro: Sedated, intubated. Pupils 27mm, fixed, conjugate.withdraws to noxious stim on right upper, but not left upper UE. No spont movement or w/d more distal to clavicle/shoulder. LE's: no spont. Movement LLE; w/d to pain proximally, but not below knee. Limited w/d to pain RLE.  GU: Clear to yellow. Adequate urine output Ext: No edema, brisk cap refill.   Resolved Hospital Problem list     Assessment & Plan:   Cerebellar Intraparenchymal Hemorrhage, evacuated 6/17 Acute progression of hydrocephalus 6/21 requiring ventriculostomy placement Acute right subdural hematoma s/p evacuation 6/21  Plan  Per neuro, poor prognosis. Significant alteration in exam today portends poorly. Continue aggressive care per family request. Family does not want tracheostomy.  CT pending but my personal review has no reaccumulation of blood products, no hydrocephalus, mild volume shift with significant R sided infarct. Seizure-like activity  Plan Valproic acid  Restarted EEG Defer to Neuro   Acute respiratory failure, VDRF Plan PS as tolerated but no extubation given mental status Titrate O2 for sat of 88-92% VAP prevention protocol   Atrial fibrillation on Xarelto  Plan Tele monitoring Rate control if needed  Hypertension  Plan Continue BP control as ordered  Fluid and electrolyte imbalance:   Plan Increase free water to 300q4 Lasix 40 mg IV q8 x2 doses Zaroxolyn x1 dose BMET in AM Replace K  Leukocytosis  Plan:  CBC in AM  Anemia   Plan Hold transfusion for now  Hyperglycemia   Plan ISS CBG  History of ETOH Abuse   Plan Thiamine Folate MVI Precedex  Best practice:  Diet: NPO; at goal rate for tubefeeds Pain/Anxiety/Delirium protocol (if  indicated): off gtts. PAD protocol in place. PRNs only  VAP protocol (if indicated): 6/21 DVT prophylaxis: SCD GI prophylaxis: PPI Glucose control: SSI Mobility: BR Code Status: FULL Family Communication: Neurology and NSGY have discussed w family 6/21 Disposition:  Remains critically ill. Require CCM service for BP support/control, mechanical ventilation titration, and titration/ management of fluid, electrolyte and acid-base imbalances. Will continue to evaluate over the next 24-48 hours.    I have independently seen and examined the patient, reviewed data, and developed an assessment and plan.  A total of 36 minutes were spent in critical care assessment and medical decision making. This critical care time does not reflect procedure time, or teaching time or supervisory time of PA/NP/Med student/Med Resident, etc but could involve care discussion time.  Bonna Gains, MD PhD 01/08/19 11:00 AM

## 2019-01-08 NOTE — Progress Notes (Signed)
EEG Completed; Results Pending  

## 2019-01-08 NOTE — Procedures (Signed)
.  EEG Report  Clinical History:  Altered mental status/ICH/SDH/Hydrocephalus  Technical Summary:  A 19 channel digital EEG recording was performed using the 10-20 international system of electrode placement.  Bipolar and Referential montages were used.  The total recording time was approx 20 minutes.  Findings:  There is no posterior dominant rhythm.  Background frequencies are in the 5-6 Hz and low in amplitude.  No focal slowing is present.  There are frequent epileptiform discharges with maximal negativity at F4 and spread to FP2, Fz, and C4.  There are no electrographic seizures.    Impression:  This is an abnormal EEG.  There is evidence of moderate generalized slowing of brain activity.  There is ongoing evidence of a seizure tendency emanating from the right frontal area with some spread to right anterior frontal and right central areas.  Compared to the EEG from yesterday, there is an improvement in that there are no electrographic seizures identified.  Rogue Jury, MS, MD

## 2019-01-08 NOTE — Progress Notes (Signed)
Nutrition Follow-up  DOCUMENTATION CODES:   Not applicable  INTERVENTION:   Tube feeding via Cortrak: Increase Vital AF 1.2 to 60 ml/hr (1440 ml/day)  Tube feeding regimen provides 1728 kcal, 108 grams of protein, and 1167 ml of H2O Total free water: 2967 ml   NUTRITION DIAGNOSIS:   Inadequate oral intake related to dysphagia as evidenced by NPO status.  Ongoing, being addressed via TF  GOAL:   Patient will meet greater than or equal to 90% of their needs  Met via TF  MONITOR:   Vent status, Labs, I & O's, Weight trends, TF tolerance, Skin  REASON FOR ASSESSMENT:   Ventilator Enteral/tube feeding initiation and management  ASSESSMENT:   73 year old male who presented to the ED on 6/17 with dizziness and diaphoresis. PMH of CAD s/p CABG, afib on Xarelto, HTN, NSTEMI, EtOH abuse. Pt was intubated in the ED after his mental status declined. CT head showing acute intraparenchymal hemorrhage on the left with intraventricular extension. S/p emergent suboccipital craniectomy for evacuation of large cerebellar hemorrhage on 6/17.  6/18 - extubated 6/19 - Cortrak placed 6/20 - MBS with recommendations for Dysphagia 2 diet and nectar-thick liquids 6/21 - CT head revealing new obstructive hydrocephalus, intubated, s/p emergent placement of right frontal ventriculostomy, repeat CT head revealing very large right-sided SDH, s/p emergent craniotomy and evacuation 6/25 EVD d/c'ed   Per MD, family does not want a trach. Per neuro pt with poor prognosis. Cortrak in place.   Patient is currently intubated on ventilator support MV: 11.1 L/min Temp (24hrs), Avg:99.9 F (37.7 C), Min:99.5 F (37.5 C), Max:100.4 F (38 C) MAP: 69  Medications reviewed and include: SSI, Novolog 3 units q 4 hours, Senna Neo @ 80 mcg  300 ml free water every 4 hours = 1800 ml  Labs reviewed: sodium 150 CBG's: 113-108  I/O's: +3.4 L since admit  Cortrak: Vital AF 1.2 @ 55 ml/hr  Provides 1584  kcal, 99 grams of protein, and 1071 ml of H2O  Diet Order:   Diet Order    None      EDUCATION NEEDS:   No education needs have been identified at this time  Skin:  Skin Assessment: Skin Integrity Issues: Incisions: closed incisions to head  Last BM:  6/26  Height:   Ht Readings from Last 1 Encounters:  12/19/2018 '5\' 4"'$  (1.626 m)    Weight:   Wt Readings from Last 1 Encounters:  01/05/2019 68.1 kg    Ideal Body Weight:  59.1 kg  BMI:  Body mass index is 25.77 kg/m.  Estimated Nutritional Needs:   Kcal:  5830  Protein:  85-100 grams  Fluid:  >/= 1.7 L  Maylon Peppers RD, LDN, CNSC [phone] Pager [phone] After Hours Pager

## 2019-01-08 NOTE — Progress Notes (Signed)
Patient became tachypneic, tachycardic, and hypertensive with a forced right gaze. 2mg  Ativan given and Neuro MD notified. 2mg  Ativan ordered as well as additional Keppra IVPB. Gaze back to midline. Will continue to monitor.    01/08/19 2112  Vitals  Pulse Rate 100  ECG Heart Rate (!) 102  Resp (!) 43  Oxygen Therapy  SpO2 98 %  Art Line  Arterial Line BP 194/79  Arterial Line MAP (mmHg) 109 mmHg

## 2019-01-09 DIAGNOSIS — R509 Fever, unspecified: Secondary | ICD-10-CM

## 2019-01-09 DIAGNOSIS — J9811 Atelectasis: Secondary | ICD-10-CM

## 2019-01-09 DIAGNOSIS — S065X9A Traumatic subdural hemorrhage with loss of consciousness of unspecified duration, initial encounter: Secondary | ICD-10-CM

## 2019-01-09 LAB — CBC WITH DIFFERENTIAL/PLATELET
Abs Immature Granulocytes: 0.74 10*3/uL — ABNORMAL HIGH (ref 0.00–0.07)
Basophils Absolute: 0 10*3/uL (ref 0.0–0.1)
Basophils Relative: 0 %
Eosinophils Absolute: 0 10*3/uL (ref 0.0–0.5)
Eosinophils Relative: 0 %
HCT: 27.6 % — ABNORMAL LOW (ref 39.0–52.0)
Hemoglobin: 8.9 g/dL — ABNORMAL LOW (ref 13.0–17.0)
Immature Granulocytes: 3 %
Lymphocytes Relative: 4 %
Lymphs Abs: 1.1 10*3/uL (ref 0.7–4.0)
MCH: 30.8 pg (ref 26.0–34.0)
MCHC: 32.2 g/dL (ref 30.0–36.0)
MCV: 95.5 fL (ref 80.0–100.0)
Monocytes Absolute: 1.6 10*3/uL — ABNORMAL HIGH (ref 0.1–1.0)
Monocytes Relative: 6 %
Neutro Abs: 23.4 10*3/uL — ABNORMAL HIGH (ref 1.7–7.7)
Neutrophils Relative %: 87 %
Platelets: 128 10*3/uL — ABNORMAL LOW (ref 150–400)
RBC: 2.89 MIL/uL — ABNORMAL LOW (ref 4.22–5.81)
RDW: 15.5 % (ref 11.5–15.5)
WBC: 26.8 10*3/uL — ABNORMAL HIGH (ref 4.0–10.5)
nRBC: 0.5 % — ABNORMAL HIGH (ref 0.0–0.2)

## 2019-01-09 LAB — GLUCOSE, CAPILLARY
Glucose-Capillary: 122 mg/dL — ABNORMAL HIGH (ref 70–99)
Glucose-Capillary: 122 mg/dL — ABNORMAL HIGH (ref 70–99)
Glucose-Capillary: 122 mg/dL — ABNORMAL HIGH (ref 70–99)
Glucose-Capillary: 124 mg/dL — ABNORMAL HIGH (ref 70–99)
Glucose-Capillary: 132 mg/dL — ABNORMAL HIGH (ref 70–99)
Glucose-Capillary: 136 mg/dL — ABNORMAL HIGH (ref 70–99)
Glucose-Capillary: 97 mg/dL (ref 70–99)

## 2019-01-09 LAB — BASIC METABOLIC PANEL
Anion gap: 13 (ref 5–15)
BUN: 49 mg/dL — ABNORMAL HIGH (ref 8–23)
CO2: 22 mmol/L (ref 22–32)
Calcium: 6.7 mg/dL — ABNORMAL LOW (ref 8.9–10.3)
Chloride: 115 mmol/L — ABNORMAL HIGH (ref 98–111)
Creatinine, Ser: 1.2 mg/dL (ref 0.61–1.24)
GFR calc Af Amer: >60 mL/min (ref >60)
GFR calc non Af Amer: >60 mL/min (ref >60)
Glucose, Bld: 158 mg/dL — ABNORMAL HIGH (ref 70–99)
Potassium: 3.4 mmol/L — ABNORMAL LOW (ref 3.5–5.1)
Sodium: 150 mmol/L — ABNORMAL HIGH (ref 135–145)

## 2019-01-09 LAB — VALPROIC ACID LEVEL: Valproic Acid Lvl: 76 ug/mL (ref 50.0–100.0)

## 2019-01-09 LAB — MAGNESIUM: Magnesium: 2.8 mg/dL — ABNORMAL HIGH (ref 1.7–2.4)

## 2019-01-09 LAB — PHOSPHORUS: Phosphorus: 2.9 mg/dL (ref 2.5–4.6)

## 2019-01-09 MED ORDER — METOPROLOL TARTRATE 5 MG/5ML IV SOLN
5.0000 mg | INTRAVENOUS | Status: DC | PRN
  Administered 2019-01-09 (×2): 5 mg via INTRAVENOUS

## 2019-01-09 MED ORDER — DILTIAZEM LOAD VIA INFUSION
10.0000 mg | Freq: Once | INTRAVENOUS | Status: DC
  Filled 2019-01-09: qty 10

## 2019-01-09 MED ORDER — LACTATED RINGERS IV BOLUS
500.0000 mL | Freq: Once | INTRAVENOUS | Status: AC
  Administered 2019-01-09: 500 mL via INTRAVENOUS

## 2019-01-09 MED ORDER — DILTIAZEM HCL-DEXTROSE 100-5 MG/100ML-% IV SOLN (PREMIX)
5.0000 mg/h | INTRAVENOUS | Status: DC
  Filled 2019-01-09: qty 100

## 2019-01-09 MED ORDER — AMIODARONE LOAD VIA INFUSION
150.0000 mg | Freq: Once | INTRAVENOUS | Status: AC
  Administered 2019-01-09: 150 mg via INTRAVENOUS
  Filled 2019-01-09: qty 83.34

## 2019-01-09 MED ORDER — AMIODARONE HCL IN DEXTROSE 360-4.14 MG/200ML-% IV SOLN
60.0000 mg/h | INTRAVENOUS | Status: DC
  Administered 2019-01-09 (×2): 60 mg/h via INTRAVENOUS
  Filled 2019-01-09 (×2): qty 200

## 2019-01-09 MED ORDER — METOPROLOL TARTRATE 5 MG/5ML IV SOLN
INTRAVENOUS | Status: AC
  Filled 2019-01-09: qty 10

## 2019-01-09 MED ORDER — AMIODARONE HCL IN DEXTROSE 360-4.14 MG/200ML-% IV SOLN
30.0000 mg/h | INTRAVENOUS | Status: DC
  Administered 2019-01-09 – 2019-01-10 (×2): 30 mg/h via INTRAVENOUS
  Filled 2019-01-09 (×3): qty 200

## 2019-01-09 NOTE — Progress Notes (Signed)
Pt tachycardiac in the 130s-180s  And tachypneic, 12 lead EKG obtained, CCM notified & called to bedside.

## 2019-01-09 NOTE — Progress Notes (Addendum)
NAME:  Randy [name], MRN:  [id number], DOB:  [birth year], LOS: 75 ADMISSION DATE:  01/09/2019, CONSULTATION DATE: June 17 REFERRING MD: Dr. Rory Percy, CHIEF COMPLAINT: Intraparenchymal hemorrhage  Brief History   73 year old male anticoagulated with Xarelto presented with intraparenchymal hematoma and was emergently taken to the OR for evacuation.  Past Medical History  Tobacco Abuse , PE , PAF - on Xarelto, NSTEMI , HTN ,ETOH Abuse   Significant Hospital Events   6/17 Admit with cerebellar ICH. To OR for evacuation. Out to ICU intubated.  6/17->6/22: extubated 6/18, had mild hydrocephalus, increased edema, decadron taper,  started on hypertonic saline, agitated started on precedex, on cleviprex to keep sbp < 160. 6/21 decreased LOC, intubated for airway protection, CT head w/ obstructive hydrocephalus. Right frontal IVC placed. Episodes of bradycardia. New right sided SDH felt 2/2 ICV placement. Went to OR again for right crani and placement of subdural drain.  6/22: Subdural drain removed, Seizure-like activity (involuntary twitching), EEG started, Clamped IVC per neurosurgery   6/23 not much change. MS a little worse.  6/24 ON new seizure like activity, EEG restarted 6/25 EVD d/c  Consults:  Neurosurgery PCCM  Procedures:  ETT 6/17 > 6/18 ETT 6/21 >>  Art line 6/17 > 6/18 Ventriculostomy 6/21 >> Clamped 6/23 >> d/c 6/25  Significant Diagnostic Tests:  CT head 6/17: Acute intraparenchymal hemorrhage within the left cerebellum with volume of 28 mL and intraventricular extension to the third and fourth ventricles. 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 CTA head 6/17: Severely motion degraded study. No proximal large vessel occlusion of the anterior circulation. The hemorrhage pattern of the left cerebellum is most consistent with hypertensive hemorrhage. CT head 6/18: L suboccipital craniotomy,  evacuation L cerebellar hemorrhage. Decreased mass-effect on fourth ventricle and posterior fossa. IVH bilaterally. Minimal pneumocephalus. Atherosclerosis ECHO 6/18: Severe akinesis of the left ventricular, mid-apical apical segment and lateral wall, LVEF ~ 50-55%, No RWMA, normal RV systolic function, moderate MV regurgitation CT head 6/22: 1. Status post right pterional craniotomy for decompression of right-sided subdural hematoma. 2. Resolution of midline shift and ventricular entrapment. Markedly decreased size of subdural hematoma over the right convexity. 3. Unchanged appearance of left cerebellar hemorrhage of actuation cavity. Unchanged volume of blood over the left convexity. 4. Moderate pneumocephalus. EEG 6/23: Evidence of cerebral dysfunction maximal in the right hemisphere and a severe diffuse encephalopathy. No epileptiform discharges or EEG seizures were recorded.  CT head 6/24: Slightly decrease in size the lateral ventricles and mildly decreased pneumocephalus while EVD clamped for 24 hours. Otherwise unchanged examination.  Micro Data:  6/17 SARS-CoV-2 > neg  Antimicrobials:  6/17 Periop cefazolin >>  Interim history/subjective:  Continues to be unresponsive. Episode of rapid atrial fibrillation.  Periods of tachypnea.  Objective   Blood pressure (!) 118/103, pulse (!) 40, temperature (!) 101.5 F (38.6 C), temperature source Axillary, resp. rate (!) 32, height 5\' 4"  (1.626 m), weight 68.1 kg, SpO2 99 %.    Vent Mode: PCV FiO2 (%):  [30 %] 30 % Set Rate:  [15 bmp] 15 bmp PEEP:  [5 cmH20] 5 cmH20 Pressure Support:  [5 cmH20] 5 cmH20 Plateau Pressure:  [11 cmH20-14 cmH20] 11 cmH20   Intake/Output Summary (Last 24 hours) at 01/09/2019 1133 Last data filed at 01/09/2019 1000 Gross per 24 hour  Intake 4412.97 ml  Output 975 ml  Net 3437.97 ml   Filed Weights   12/19/2018 1352 01/04/2019 2245  Weight: 68 kg 68.1 kg    Examination: General: 73 year old. Well nourished  male HENT: [place], staples from prior crani/EVD placement in place.  ETT in place. Enteric tube in place Pulm: Intubated. BS Diminished throughout.  Patient with marked Cheyne-Stokes breathing pattern. Card: RRR, no r/m/g  Abd: Soft, nontender, present bowel sounds x4 Neuro: Sedated, intubated. Pupils 95mm, fixed, conjugate.withdraws to noxious stim on right upper, but not left upper UE. No spont movement or w/d more distal to clavicle/shoulder. LE's: no spont. Movement LLE; w/d to pain proximally, but not below knee. Limited w/d to pain RLE.  Occasional blinking eye movements suggestive of seizure activity. GU: Clear to yellow. Adequate urine output Ext: No edema, brisk cap refill.   Resolved Hospital Problem list     Assessment & Plan:   Cerebellar Intraparenchymal Hemorrhage, evacuated 6/17 Acute progression of hydrocephalus 6/21 requiring ventriculostomy placement Acute right subdural hematoma s/p evacuation 6/21  Plan  Per neuro, poor prognosis. Significant alteration in exam today portends poorly. Continue aggressive care per family request. Family does not want tracheostomy.  CT pending but my personal review has no reaccumulation of blood products, no hydrocephalus, mild volume shift with significant R sided infarct. Seizure-like activity  Plan Valproic acid  Restarted EEG I have discussed the patients prognosis with Dr Erlinda Hong and the patient's family and we have all agreed that the patient's long-term prognosis is for an unacceptable level of debility.  We are planning a withdrawal of care tomorrow am. Do not expect patient will survive long after discontinuation of care.  Acute respiratory failure, VDRF Plan PS as tolerated but no extubation given mental status Titrate O2 for sat of 88-92% VAP prevention protocol   Atrial fibrillation on Xarelto  Plan Tele monitoring Rate control if needed  Hypertension  Plan Continue BP control as ordered  Fluid and electrolyte  imbalance:   Plan Increase free water to 300q4 Lasix 40 mg IV q8 x2 doses Zaroxolyn x1 dose BMET in AM Replace K  Leukocytosis  Plan:  CBC in AM  Anemia   Plan Hold transfusion for now  Hyperglycemia   Plan ISS CBG  History of ETOH Abuse   Plan Thiamine Folate MVI Precedex  Best practice:  Diet: NPO; at goal rate for tubefeeds Pain/Anxiety/Delirium protocol (if indicated): off gtts. PAD protocol in place. PRNs only  VAP protocol (if indicated): 6/21 DVT prophylaxis: SCD GI prophylaxis: PPI Glucose control: SSI Mobility: BR Code Status: DNR with no escalation of care. Family Communication: I have communicated with the family today. Disposition: For scheduled withdrawal of care tomorrow am  CRITICAL CARE Performed by: Kipp Brood   Total critical care time: 40 minutes  Critical care time was exclusive of separately billable procedures and treating other patients.  Critical care was necessary to treat or prevent imminent or life-threatening deterioration.  Critical care was time spent personally by me on the following activities: development of treatment plan with patient and/or surrogate as well as nursing, discussions with consultants, evaluation of patient's response to treatment, examination of patient, obtaining history from patient or surrogate, ordering and performing treatments and interventions, ordering and review of laboratory studies, ordering and review of radiographic studies, pulse oximetry, re-evaluation of patient's condition and participation in multidisciplinary rounds.  Kipp Brood, MD [facility] ICU Physician [place]  Pager: [phone] Mobile: [phone] After hours: [phone].    01/09/19 11:33 AM

## 2019-01-09 NOTE — Progress Notes (Signed)
STROKE TEAM PROGRESS NOTE   INTERVAL HISTORY Patient RN and Dr. Lynetta Mare are at the bedside.  Patient still intubated, unresponsive.  Cheyne-Stokes respiratory pattern.  A. fib RVR with heart rate ~140s.  Intermittent right eyelid twitching, but no arm or leg twitching.  EEG yesterday showed right hemisphere slowing.  Currently on Vimpat, Keppra and Depakote.  Depakote level this morning 76.  Still have fever this morning 100.2.  Patient neuro condition poor, Dr. Lynetta Mare is going to discuss with family regarding goals of care.  Vitals:   01/09/19 0430 01/09/19 0435 01/09/19 0445 01/09/19 0737  BP: 113/70  105/66 (!) 138/57  Pulse: 91 92 85   Resp: (!) 30 (!) 28 (!) 30   Temp:      TempSrc:      SpO2: 98% 98% 97%   Weight:      Height:        CBC:  Recent Labs  Lab 01/08/19 0840 01/09/19 0557  WBC 31.9* 26.8*  NEUTROABS 27.4* 23.4*  HGB 10.2* 8.9*  HCT 31.6* 27.6*  MCV 92.9 95.5  PLT 159 128*    Basic Metabolic Panel:  Recent Labs  Lab 01/08/19 0855 01/09/19 0557  NA 150* 150*  K 3.6 3.4*  CL 119* 115*  CO2 22 22  GLUCOSE 136* 158*  BUN 58* 49*  CREATININE 1.28* 1.20  CALCIUM 7.2* 6.7*  MG 2.7* 2.8*  PHOS 3.5 2.9   IMAGING Ct Head Wo Contrast 01/07/2019 1. Right ACA and PCA territory acute infarcts with small volume right frontal hemorrhage. 2. Stable multifocal intracranial hemorrhage as described. 3. Right frontal drain with normal ventricular volume.  01/06/2019 Slight decrease in size the lateral ventricles and mildly decreased pneumocephalus. Otherwise unchanged examination.  01/04/2019  resolution of right-sided subdural hematoma and midline shift with postoperative changes.  Right ventricular catheter in optimal position.  Unchanged appearance of left cerebellar hematoma evacuation.  Dg Chest Port 1 View 01/05/2019 1. Stable hardware positioning. 2. Improved left base aeration.  EEG  01/07/2019 No ongoing sz per Leonie Man review.  Brief 2-second run of  irritability.  Formal reading pending  01/06/2019 This EEG shows evidence of cerebral dysfunction maximal in the right hemisphere and a severe diffuse encephalopathy. No epileptiform discharges or EEG seizures were recorded.  01/05/2019 This is an abnormal EEG.  There is evidence of a seizure tendency emanating from the right central area of the brain with an electrographic seizure emanating from the right frontocentral area of the brain.     PHYSICAL EXAM:  General - Well nourished, well developed, elderly Caucasian male who is intubated without sedation  Ophthalmologic - fundi not visualized due to noncooperation.  Cardiovascular - irregularly irregular heart rate and rhythm, afib RVR.  Neuro - intubated not on sedation.  Eyes are closed, not open to voice or pain.  Unresponsive, not following commands.  Bilateral pupils 2.5 mm not reactive to light.  Eyes in middle position, absence of doll's eyes.  No tracking, no blinking to visual threat bilaterally.  Corneal reflex absent bilaterally.  Positive gag reflexes. Breathing over the vent, in Cheyne-Stokes pattern.  Facial symmetry not able to test due to ET tube.  Able to frown with pain stimulation.  Tongue protrusion not cooperative. On pain stimulation, only slight withdrawal of left arm, no movement of other limbs. DTR absent and no babinski. Sensation, coordination and gait not tested.   ASSESSMENT/PLAN [name] is a 73 y.o. male with history of tobacco abuse, and pulmonary  embolus, paroxysmal atrial fibrillation, hypertension, alcohol abuse with history of coagulopathy in setting of Xarelto from prior alcoholism presenting with dizziness, diaphoresis and bradycardia. Initially felt to be cardiac but cerebellar bleed found on imaging.   ICH: Left cerebellar ICH with IVH on Xarelto and aspirin, s/p [place] reversal and suboccipital hematoma evacuation. Developed hydrocephalus s/p EVD, as well as resultant R SDH requiring surgical  evacuation  Neurosurgery consult Dr. Ronnald Ramp, post suboccipital Craniectomy 12/25/2018.Marland Kitchen Neurological worsening due to hydrocephalus 01/02/2019 requiring emergent ventriculostomy followed by further neurological worsening due to acute right subdural hematoma requiring emergent right craniotomy for hematoma evacuation  CT head 6/17 left cerebellar hemorrhage 28 mils with IVH.  Posterior fossa without hydrocephalus  CTA head motion degraded.  No gross AVM or aneurysm  CT head 6/18 L suboccipital craniotomy, evacuation L cerebellar hemorrhage.  Decreased mass-effect on fourth ventricle and posterior fossa.  IVH bilaterally.  Minimal pneumocephalus.  Atherosclerosis.  CT Head - 6/19 - Stable volume of intracranial hemorrhage that is mainly intraventricular.   CT Head - 6/21 4196 - interval worsening lateral and 3rd vent hydrocephalus. Stable L cerebellar hmg evacuation  CT Head - 6/21 0715 - interval development large R SDH 42mm thick w/ 53mm shift. EVD placement with decompression of ventricles. Postop L occipital crani stable.  CT repeat 6/22 0321 stable hematoma s/p evacuation and IVH, no hydrocephalus but right hemisphere developing hygroma  CT head 6/24 slight decrease in ventricle size  CT head 6/25 stable  2D Echo EF 50 to 55%  LDL 48  HgbA1c 6.1  SCDs for VTE prophylaxis  aspirin 81 mg daily and Xarelto (rivaroxaban) daily prior to admission, now on No antithrombotic given hemorrhage.   Therapy recommendations: pending  Disposition:  pending   Currently neuro exam showed poor prognosis. Family does not want long term trach and PEG. Dr. Lynetta Mare is going to discuss with family about [place].   Cerebral Edema with MLS due to right SDH s/p evaculation Induced Hypernatremia   Treated with 3%, now off  Off dexamethasone   Na 153->150  On free water 300 q4h  CT head 6/26 - stable SDH and no MLS  Check Na daily  Hydrocephalus and right SDH   EVD placement   Neurological  worsening due to hydrocephalus 01/02/2019 requiring emergent ventriculostomy followed by further neurological worsening due to acute right SDH requiring emergent right craniotomy for hematoma evacuation  Put on ancef 6/21>>  CT 6/22 stable post evacuation  EVD clamped 6/23  CT serials decreasing ventricle size and stable SDH   EVD out 6/25  D/c decadron  New onset seizure  Started 6/23 0929 after EVD clamped  Treated w/ versed  Loaded with Keppra and valproic acid   EEG w/ seizure  LTM EEG w/ R sided dysfunction, no seizure. D/c LT EEG 6/25  6/26 EEG right hemisphere slowing  On keppra 1000, vimpat 100 and depakote 1000 Q8  depakote level 84->76  Acute Hypoxic Respiratory failure / VDRF  Secondary to stroke   Intubated in ED   Extubated 12/31/18  Re-intubated 6/21 for neuro worsening and SDH  Cheyne-Stokes breathing pattern  CCM on board  Post CABG Atrial Fibrillation  CABG 06/2018  Home anticoagulation:  Xarelto (rivaroxaban) daily   Reversed with [place]  AC on hold d/t bleed  Currently still afib RVR with HR 140s  Hypertensive emergency  Home meds: Cozaar 100, Toprol 50  Systolic blood pressure greater than 200s with hemorrhage   Treated with Cleviprex  On norvasc 2.5, losartan 100, metoprolol 50 bid  SBP goal <160   Fever with leukocytosis  WBC 24.2-31.9-26.8  Decadron discontinued  T-max 100.7-100.2  On Ancef  Cooling blanket  CCM on board   UA pending   blood culture pending  Hyperlipidemia  Home meds: Lipitor 80  LDL 48, at goal < 70  Hold statin given hemorrhage  Consider continuation of statin lower dose at discharge  Hyperglycemia  HgbA1c 6.1  On novolog 3u q4h  SSI  Dysphagia   Secondary to stroke   Currently n.p.o.   Speech following  Cortrak placed 01/01/2019 - now intubated with OG  On tube feeds  Other Stroke Risk Factors  Advanced age  Former smoker, quit smoking 6 months ago  History  of alcohol abuse, last drink 10 years ago. LFTs normal  Coronary artery disease, NSTEMI s/p CABG, December 2019  Other Active Problems  Mildly elevated troponin 0.05, reactionary  Anemia 11.8-10.2-8.9  Thrombocytopenia [phone]  Hospital day # 10  This patient is critically ill and at significant risk of neurological worsening, death and care requires constant monitoring of vital signs, hemodynamics,respiratory and cardiac monitoring, extensive review of multiple databases, frequent neurological assessment, discussion with family, other specialists and medical decision making of high complexity. I spent 35 minutes of neurocritical care time  in the care of  this patient.  I also discussed with Dr. Lynetta Mare at bedside.   Rosalin Hawking, MD PhD Stroke Neurology 01/09/2019 10:51 AM   To contact Stroke Continuity provider, please refer to [email or web address] After hours, contact General Neurology

## 2019-01-09 NOTE — Progress Notes (Signed)
  Amiodarone Drug - Drug Interaction Consult Note  Recommendations: Monitoring QTc while on both Vimpat and amiodarone   Amiodarone is metabolized by the cytochrome P450 system and therefore has the potential to cause many drug interactions. Amiodarone has an average plasma half-life of 50 days (range 20 to 100 days).   There is potential for drug interactions to occur several weeks or months after stopping treatment and the onset of drug interactions may be slow after initiating amiodarone.   []  Statins: Increased risk of myopathy. Simvastatin- restrict dose to 20mg  daily. Other statins: counsel patients to report any muscle pain or weakness immediately.  []  Anticoagulants: Amiodarone can increase anticoagulant effect. Consider warfarin dose reduction. Patients should be monitored closely and the dose of anticoagulant altered accordingly, remembering that amiodarone levels take several weeks to stabilize.  []  Antiepileptics: Amiodarone can increase plasma concentration of phenytoin, the dose should be reduced. Note that small changes in phenytoin dose can result in large changes in levels. Monitor patient and counsel on signs of toxicity.  []  Beta blockers: increased risk of bradycardia, AV block and myocardial depression. Sotalol - avoid concomitant use.  []   Calcium channel blockers (diltiazem and verapamil): increased risk of bradycardia, AV block and myocardial depression.  []   Cyclosporine: Amiodarone increases levels of cyclosporine. Reduced dose of cyclosporine is recommended.  []  Digoxin dose should be halved when amiodarone is started.  []  Diuretics: increased risk of cardiotoxicity if hypokalemia occurs.  []  Oral hypoglycemic agents (glyburide, glipizide, glimepiride): increased risk of hypoglycemia. Patient's glucose levels should be monitored closely when initiating amiodarone therapy.   [x]  Drugs that prolong the QT interval:  Torsades de pointes risk may be increased with  concurrent use - avoid if possible.  Monitor QTc, also keep magnesium/potassium WNL if concurrent therapy can't be avoided. Marland Kitchen Antibiotics: e.g. fluoroquinolones, erythromycin. . Antiarrhythmics: e.g. quinidine, procainamide, disopyramide, sotalol. . Antipsychotics: e.g. phenothiazines, haloperidol.  . Lithium, tricyclic antidepressants, and methadone. Thank Dawson Bills  01/09/2019 1:18 PM

## 2019-01-10 LAB — MAGNESIUM: Magnesium: 2.7 mg/dL — ABNORMAL HIGH (ref 1.7–2.4)

## 2019-01-10 LAB — BASIC METABOLIC PANEL
Anion gap: 11 (ref 5–15)
BUN: 42 mg/dL — ABNORMAL HIGH (ref 8–23)
CO2: 23 mmol/L (ref 22–32)
Calcium: 6.8 mg/dL — ABNORMAL LOW (ref 8.9–10.3)
Chloride: 114 mmol/L — ABNORMAL HIGH (ref 98–111)
Creatinine, Ser: 1.08 mg/dL (ref 0.61–1.24)
GFR calc Af Amer: >60 mL/min (ref >60)
GFR calc non Af Amer: >60 mL/min (ref >60)
Glucose, Bld: 132 mg/dL — ABNORMAL HIGH (ref 70–99)
Potassium: 3.2 mmol/L — ABNORMAL LOW (ref 3.5–5.1)
Sodium: 148 mmol/L — ABNORMAL HIGH (ref 135–145)

## 2019-01-10 LAB — CBC WITH DIFFERENTIAL/PLATELET
Abs Immature Granulocytes: 0.43 10*3/uL — ABNORMAL HIGH (ref 0.00–0.07)
Basophils Absolute: 0 10*3/uL (ref 0.0–0.1)
Basophils Relative: 0 %
Eosinophils Absolute: 0 10*3/uL (ref 0.0–0.5)
Eosinophils Relative: 0 %
HCT: 28 % — ABNORMAL LOW (ref 39.0–52.0)
Hemoglobin: 8.9 g/dL — ABNORMAL LOW (ref 13.0–17.0)
Immature Granulocytes: 2 %
Lymphocytes Relative: 5 %
Lymphs Abs: 1 10*3/uL (ref 0.7–4.0)
MCH: 30.3 pg (ref 26.0–34.0)
MCHC: 31.8 g/dL (ref 30.0–36.0)
MCV: 95.2 fL (ref 80.0–100.0)
Monocytes Absolute: 1 10*3/uL (ref 0.1–1.0)
Monocytes Relative: 5 %
Neutro Abs: 17.1 10*3/uL — ABNORMAL HIGH (ref 1.7–7.7)
Neutrophils Relative %: 88 %
Platelets: 121 10*3/uL — ABNORMAL LOW (ref 150–400)
RBC: 2.94 MIL/uL — ABNORMAL LOW (ref 4.22–5.81)
RDW: 15.3 % (ref 11.5–15.5)
WBC: 19.7 10*3/uL — ABNORMAL HIGH (ref 4.0–10.5)
nRBC: 0.3 % — ABNORMAL HIGH (ref 0.0–0.2)

## 2019-01-10 LAB — GLUCOSE, CAPILLARY
Glucose-Capillary: 103 mg/dL — ABNORMAL HIGH (ref 70–99)
Glucose-Capillary: 114 mg/dL — ABNORMAL HIGH (ref 70–99)

## 2019-01-10 LAB — VALPROIC ACID LEVEL: Valproic Acid Lvl: 56 ug/mL (ref 50.0–100.0)

## 2019-01-10 LAB — PHOSPHORUS: Phosphorus: 2.5 mg/dL (ref 2.5–4.6)

## 2019-01-10 MED ORDER — POLYVINYL ALCOHOL 1.4 % OP SOLN
1.0000 [drp] | Freq: Four times a day (QID) | OPHTHALMIC | Status: DC | PRN
  Filled 2019-01-10: qty 15

## 2019-01-10 MED ORDER — ONDANSETRON HCL 4 MG/2ML IJ SOLN: 4.0000 mg | Freq: Four times a day (QID) | INTRAMUSCULAR | Status: DC | PRN

## 2019-01-10 MED ORDER — GLYCOPYRROLATE 0.2 MG/ML IJ SOLN: 0.2000 mg | INTRAMUSCULAR | Status: DC | PRN

## 2019-01-10 MED ORDER — MORPHINE 100MG IN NS 100ML (1MG/ML) PREMIX INFUSION
10.0000 mg/h | INTRAVENOUS | Status: DC
  Administered 2019-01-10: 10 mg/h via INTRAVENOUS
  Filled 2019-01-10: qty 100

## 2019-01-10 MED ORDER — HALOPERIDOL 0.5 MG PO TABS
0.5000 mg | ORAL_TABLET | ORAL | Status: DC | PRN
  Filled 2019-01-10: qty 1

## 2019-01-10 MED ORDER — POTASSIUM CHLORIDE 20 MEQ/15ML (10%) PO SOLN
30.0000 meq | ORAL | Status: DC
  Administered 2019-01-10: 30 meq
  Filled 2019-01-10: qty 30

## 2019-01-10 MED ORDER — BIOTENE DRY MOUTH MT LIQD: 15.0000 mL | OROMUCOSAL | Status: DC | PRN

## 2019-01-10 MED ORDER — HALOPERIDOL LACTATE 2 MG/ML PO CONC
0.5000 mg | ORAL | Status: DC | PRN
  Filled 2019-01-10: qty 0.3

## 2019-01-10 MED ORDER — ONDANSETRON 4 MG PO TBDP
4.0000 mg | ORAL_TABLET | Freq: Four times a day (QID) | ORAL | Status: DC | PRN
  Filled 2019-01-10: qty 1

## 2019-01-10 MED ORDER — MORPHINE BOLUS VIA INFUSION
4.0000 mg | INTRAVENOUS | Status: DC | PRN
  Filled 2019-01-10: qty 4

## 2019-01-10 MED ORDER — GLYCOPYRROLATE 1 MG PO TABS
1.0000 mg | ORAL_TABLET | ORAL | Status: DC | PRN
  Filled 2019-01-10: qty 1

## 2019-01-10 MED ORDER — HALOPERIDOL LACTATE 5 MG/ML IJ SOLN: 0.5000 mg | INTRAMUSCULAR | Status: DC | PRN

## 2019-01-13 NOTE — Death Summary Note (Addendum)
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 Alcohol abuse    Last drink was 10 years ago (2009).  Marland Kitchen Hypertension   . NSTEMI (non-ST elevated myocardial infarction) ([place]) 06/29/2018   s/p CABG w/ LIMA-LAD, SVG-diagonal, SVG-OM, SVG-PLV  . PAF (paroxysmal atrial fibrillation) ([place]) 06/2018   Postop CABG  . Pulmonary embolus ([place]) 06/29/2018  . Tobacco abuse    Past Surgical History:  Procedure Laterality Date  . CORONARY ARTERY BYPASS GRAFT N/A 07/01/2018   Procedure: CORONARY ARTERY BYPASS GRAFTING (CABG) x 4, LIMA TO LAD, SVG TO OM1, SVG TO DIAG, SVG TO PL, USING LEFT INTERNAL MAMMARY ARTERY AND RIGHT GREATER SAPHENOUS VEIN HARVESTED ENDOSCOPICALLY;  Surgeon: Grace Isaac, MD;  Location: [facility];  Service: Open Heart Surgery;  Laterality: N/A;  . CRANIOTOMY Right 01/06/2019   Procedure: Right CRANIOTOMY HEMATOMA EVACUATION SUBDURAL. Placement of Right Ventricular Catheter;  Surgeon: Earnie Larsson, MD;  Location: [facility];  Service: Neurosurgery;  Laterality: Right;  . IABP INSERTION Right 06/29/2018   Procedure: IABP Insertion;  Surgeon: Sherren Mocha, MD;  Location: [facility];  Service: Cardiovascular;  Laterality: Right;  . LEFT HEART CATH AND  CORONARY ANGIOGRAPHY N/A 06/29/2018   Procedure: LEFT HEART CATH AND CORONARY ANGIOGRAPHY;  Surgeon: Sherren Mocha, MD;  Location: [facility];  Service: Cardiovascular;  Laterality: N/A;  . SUBOCCIPITAL CRANIECTOMY CERVICAL LAMINECTOMY N/A January 21, 2019   Procedure: SUBOCCIPITAL CRANIECTOMY FOR CEREBELLAR HEMATOMA;  Surgeon: Eustace Moore, MD;  Location: [facility];  Service: Neurosurgery;  Laterality: N/A;  . TEE WITHOUT CARDIOVERSION N/A 07/01/2018   Procedure: TRANSESOPHAGEAL ECHOCARDIOGRAM (TEE);  Surgeon: Grace Isaac, MD;  Location: [facility];  Service: Open Heart Surgery;  Laterality: N/A;    Family History Family History  Problem Relation Age of Onset  . CAD Father        died from a "massive heart attack" in his mid 47's  . Stroke Neg Hx   . Sudden death Neg Hx     Social History  reports that he quit smoking about 6 months ago. His smoking use included cigarettes. He has a 25.00 pack-year smoking history. He has never used smokeless tobacco. He reports previous alcohol use. He reports that he does not use drugs.  Allergies as of 02/01/2019   No Known Allergies     HOME MEDICATIONS PRIOR TO ADMISSION Medications Prior to Admission  Medication Sig Dispense Refill  . aspirin EC 81 MG EC tablet Take 1 tablet (81 mg total) by mouth daily.    Marland Kitchen atorvastatin (LIPITOR) 80 MG tablet Take 1 tablet (80 mg total) by mouth daily at 6 PM. 90 tablet 1  .  losartan (COZAAR) 100 MG tablet Take 1 tablet (100 mg total) by mouth daily. 90 tablet 1  . metoprolol succinate (TOPROL-XL) 50 MG 24 hr tablet Take 1 tablet (50 mg total) by mouth daily. Take with or immediately following a meal. 90 tablet 1  . rivaroxaban (XARELTO) 20 MG TABS tablet Take 20 mg by mouth daily with supper.       HOSPITAL MEDICATIONS . chlorhexidine gluconate (MEDLINE KIT)  15 mL Mouth Rinse BID  . Chlorhexidine Gluconate Cloth  6 each Topical Daily  . mouth rinse  15 mL Mouth Rinse 10 times per day     LABORATORY STUDIES CBC    Component Value Date/Time   WBC 19.7 (H) 02-04-2019 0513   RBC 2.94 (L) February 04, 2019 0513   HGB 8.9 (L) 02/04/19 0513   HCT 28.0 (L) 02-04-19 0513   PLT 121 (L) 02/04/19 0513   MCV 95.2 Feb 04, 2019 0513   MCH 30.3 02/04/19 0513   MCHC 31.8 Feb 04, 2019 0513   RDW 15.3 2019/02/04 0513   LYMPHSABS 1.0 02-04-2019 0513   MONOABS 1.0 02/04/19 0513   EOSABS 0.0 02-04-19 0513   BASOSABS 0.0 Feb 04, 2019 0513   CMP    Component Value Date/Time   NA 148 (H) 04-Feb-2019 0513   NA 137 08/18/2018 0947   K 3.2 (L) February 04, 2019 0513   CL 114 (H) 2019-02-04 0513   CO2 23 02-04-19 0513   GLUCOSE 132 (H) 02/04/19 0513   BUN 42 (H) 02-04-2019 0513   BUN 23 08/18/2018 0947   CREATININE 1.08 02-04-2019 0513   CALCIUM 6.8 (L) 02-04-2019 0513   PROT 5.1 (L) 01/07/2019 0415   PROT 6.2 08/18/2018 0947   ALBUMIN 2.2 (L) 01/07/2019 0415   ALBUMIN 4.3 08/18/2018 0947   AST 44 (H) 01/07/2019 0415   ALT 16 01/07/2019 0415   ALKPHOS 36 (L) 01/07/2019 0415   BILITOT 0.6 01/07/2019 0415   BILITOT 0.7 08/18/2018 0947   GFRNONAA >60 2019/02/04 0513   GFRAA >60 04-Feb-2019 0513   COAGS Lab Results  Component Value Date   INR 1.2 12/31/2018   INR 1.6 (H) 01/02/2019   INR 1.42 07/01/2018   Lipid Panel    Component Value Date/Time   CHOL 126 12/27/2018 1803   CHOL 123 08/18/2018 0947   TRIG 94 01/06/2019 0500   HDL 58 01/06/2019 1803   HDL 68 08/18/2018 0947   CHOLHDL 2.2 12/20/2018 1803   VLDL 20 12/21/2018 1803   LDLCALC 48 12/27/2018 1803   LDLCALC 43 08/18/2018 0947   HgbA1C  Lab Results  Component Value Date   HGBA1C 6.1 (H) 01/05/2019   Urinalysis    Component Value Date/Time   COLORURINE YELLOW 01/01/2019 1415   APPEARANCEUR CLEAR 01/01/2019 1415   LABSPEC 1.014 01/01/2019 1415   PHURINE 5.0 01/01/2019 1415   GLUCOSEU NEGATIVE 01/01/2019 1415   HGBUR MODERATE (A) 01/01/2019 1415   BILIRUBINUR NEGATIVE 01/01/2019 1415   KETONESUR  NEGATIVE 01/01/2019 1415   PROTEINUR NEGATIVE 01/01/2019 1415   NITRITE NEGATIVE 01/01/2019 1415   LEUKOCYTESUR NEGATIVE 01/01/2019 1415   Urine Drug Screen     Component Value Date/Time   LABOPIA NONE DETECTED 01/04/2019 2301   COCAINSCRNUR NONE DETECTED 12/24/2018 2301   LABBENZ NONE DETECTED 12/29/2018 2301   AMPHETMU NONE DETECTED 12/18/2018 2301   THCU NONE DETECTED 12/17/2018 2301   LABBARB NONE DETECTED 12/20/2018 2301    Alcohol Level    Component Value Date/Time   ETH <10 06/29/2018 1035  SIGNIFICANT DIAGNOSTIC STUDIES  Ct Angio Head W Or Wo Contrast 12/28/2018 IMPRESSION:  1. Severely motion degraded study. No proximal large vessel occlusion of the anterior circulation.  2. The hemorrhage pattern of the left cerebellum is most consistent with hypertensive hemorrhage.   Ct Head Wo Contrast 01/08/2019 IMPRESSION:  1. Similar appearing right-sided acute infarcts with small volume right frontal hemorrhage. No midline shift or herniation.  2. Decreased intraventricular hemorrhage in the left occipital horn. Otherwise unchanged multifocal intracranial hemorrhage as described above.   Ct Head Wo Contrast 01/07/2019 IMPRESSION:  1. Right ACA and PCA territory acute infarcts with small volume right frontal hemorrhage.  2. Stable multifocal intracranial hemorrhage as described.  3. Right frontal drain with normal ventricular volume.   Ct Head Wo Contrast 01/06/2019 IMPRESSION:  Slight decrease in size the lateral ventricles and mildly decreased pneumocephalus. Otherwise unchanged examination.   Ct Head Wo Contrast 01/04/2019 IMPRESSION:  1. Status post right pterional craniotomy for decompression of right-sided subdural hematoma.  2. Resolution of midline shift and ventricular entrapment. Markedly decreased size of subdural hematoma over the right convexity.  3. Unchanged appearance of left cerebellar hemorrhage of actuation cavity. Unchanged volume of blood over  the left convexity.  4. Moderate pneumocephalus.   Ct Head Wo Contrast 12/29/2018 IMPRESSION:  1. Interval development of large acute subdural hematoma on the right measuring up to 19 mm in thickness. 18 mm midline shift to the left.  2. Interval ventricular drain placement with decompression of the ventricles  3. Postop left occipital craniotomy for hematoma evacuation in the left cerebellum. No recurrent hemorrhage in the left cerebellum.   Ct Head Wo Contrast 01/08/2019 IMPRESSION:  1. Interval worsening of lateral and third ventricle obstructive hydrocephalus.  2. Stable left cerebellum hematoma evacuation cavity and small volume of intraventricular hemorrhage.  Ct Head Wo Contrast 01/01/2019 IMPRESSION:  1. Stable volume of intracranial hemorrhage that is mainly intraventricular.  2. Interval normalization of lateral ventricular volume.   Ct Head Wo Contrast 12/31/2018 IMPRESSION:  1. Left suboccipital craniotomy for evacuation of left cerebellar hemorrhage.  2. Decreased mass effect on the fourth ventricle and in the posterior fossa.  3. Intraventricular hemorrhage now evident in the fourth ventricle, aqueduct of Sylvius, third ventricle, and layering in the lateral ventricles bilaterally.  4. Minimal pneumocephalus as expected.  5. Atherosclerosis   Ct Head Wo Contrast 01/05/2019 IMPRESSION:  Acute intraparenchymal hemorrhage within the left cerebellum with volume of 28 mL and intraventricular extension to the third and fourth ventricles. 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   Dg Chest Port 1 View 01/08/2019 IMPRESSION:  1. Support lines and tubing in satisfactory position.  2. No acute cardiopulmonary disease.   Dg Chest Port 1 View 01/05/2019 IMPRESSION:  1. Stable hardware positioning.  2. Improved left base aeration.   Dg Chest Port 1 View 01/04/2019 IMPRESSION:   Stable support apparatus. Stable left basilar opacity as described above   Dg Chest Port 1 View 12/18/2018 IMPRESSION:  Endotracheal tube tip projects 4.6 cm above the carina. Stable left basilar consolidation.   Dg Chest Port 1 View 01/01/2019 IMPRESSION:  Enlargement of cardiac silhouette post CABG. Atelectasis versus consolidation LEFT lower lobe increased since previous exam.   Dg Chest Portable 1 View 01/12/2019 IMPRESSION:  1. Lines and tubes as above.  2. Borderline cardiomegaly. The patient is status post prior median sternotomy.  3. The lungs are clear without evidence of a  significant pneumothorax or pleural effusion.   Dg Abd Portable 1v 12/31/2018 IMPRESSION:  Esophageal tube side-port overlies the GE junction, consider further advancement for more optimal positioning   Dg Swallowing Func-speech Pathology 01/02/2019 CLINICAL IMPRESSIONS Pt demonstrates moderate aspiration risk with a mild dysphagia primarily respirtory based. Oral deficits are present resulting in decreased bolus cohesion with solids and with pills, though mastication complete. When taking sips of thin liquids at a slow pace, one sip at a time, swallow function is WNL. Due to intermittent timing/coordination impairment, likely due to shortness of breath, pt did have one instance of trace sensed aspriation with consecutive straw sips, and then one instance of significant silent aspiration when trying to coordinate continuous sips of thin to transit pill. Pt is very congested and cough is weak. Given good timing and airway protection with nectar thick liquids recommend Dys 2/nectar diet. Pills should be given whole in puree. NG tube did not significantly interfere with swallow, so pt may keep this in place until toelrance is determined.  SLP Visit Diagnosis Dysphagia, oropharyngeal phase (R13.12) Attention and concentration deficit following -- Frontal lobe and executive function deficit following -- Impact on  safety and function Moderate aspiration risk   CHL IP  TREATMENT RECOMMENDATION  Therapy as outlined in treatment plan below   Prognosis 01/01/2019 Prognosis for Safe Diet Advancement Good Barriers to Reach Goals -- Barriers/Prognosis Comment --  CHL IP DIET RECOMMENDATION Dysphagia 2 (Fine chop) solids;Nectar thick liquid Liquid Administration via Cup;Straw Medication Administration Whole meds with puree Compensations Slow rate;Small sips/bites Postural Changes Seated upright at 90 degrees    CHL IP OTHER RECOMMENDATIONS   Recommended Consults -- Oral Care Recommendations Oral care BID  CHL IP FOLLOW UP RECOMMENDATIONS  Inpatient Rehab   CHL IP FREQUENCY AND DURATION 01/02/2019 Speech Therapy Frequency (ACUTE ONLY) min 2x/week Treatment Duration 2 weeks      CHL IP ORAL PHASE 01/02/2019 Oral Phase Impaired Oral - Pudding Teaspoon -- Oral - Pudding Cup -- Oral - Honey Teaspoon -- Oral - Honey Cup -- Oral - Nectar Teaspoon -- Oral - Nectar Cup WFL Oral - Nectar Straw WFL Oral - Thin Teaspoon -- Oral - Thin Cup WFL Oral - Thin Straw WFL Oral - Puree WFL Oral - Mech Soft -- Oral - Regular Reduced posterior propulsion;Decreased bolus cohesion Oral - Multi-Consistency -- Oral - Pill Decreased bolus cohesion;Delayed oral transit;Incomplete tongue to palate contact;Reduced posterior propulsion Oral Phase - Comment --  CHL IP PHARYNGEAL PHASE 01/02/2019 Pharyngeal Phase Impaired Pharyngeal- Pudding Teaspoon -- Pharyngeal -- Pharyngeal- Pudding Cup -- Pharyngeal -- Pharyngeal- Honey Teaspoon -- Pharyngeal -- Pharyngeal- Honey Cup -- Pharyngeal -- Pharyngeal- Nectar Teaspoon -- Pharyngeal -- Pharyngeal- Nectar Cup WFL Pharyngeal -- Pharyngeal- Nectar Straw WFL Pharyngeal -- Pharyngeal- Thin Teaspoon -- Pharyngeal -- Pharyngeal- Thin Cup Penetration/Aspiration before swallow;WFL;Moderate aspiration Pharyngeal Material does not enter airway;Material enters airway, passes BELOW cords without attempt by patient to eject out  (silent aspiration) Pharyngeal- Thin Straw Penetration/Aspiration before swallow;Trace aspiration Pharyngeal Material enters airway, passes BELOW cords and not ejected out despite cough attempt by patient Pharyngeal- Puree WFL Pharyngeal -- Pharyngeal- Mechanical Soft -- Pharyngeal -- Pharyngeal- Regular WFL Pharyngeal -- Pharyngeal- Multi-consistency -- Pharyngeal -- Pharyngeal- Pill -- Pharyngeal -- Pharyngeal Comment --  No flowsheet data found.       [place] Ekg [facility] 01/02/2019 If [facility] image not attached, placement could not be confirmed due to current cardiac rhythm  [place] [facility] 12/31/2018 If [facility]  image not attached, placement could not be confirmed due to current cardiac rhythm.     HISTORY OF PRESENT ILLNESS (From Dr Johny Chess H&P on 12/24/2018) Nichlos Kunzler is a 73 y.o. male history of tobacco abuse, pulmonary embolism, paroxysmal atrial fibrillation, hypertension, alcohol abuse. Patient also has coagulopathy in the setting of Xarelto and ?From prior alcoholism.  Patient apparently was in [place] this afternoon when he suddenly felt dizzy and diaphoretic.  Patient states that he felt he was in a pass out but sat down.  EMS was called immediately.  Patient was immediately brought to [facility] and noted to be bradycardic in the 30s to 40s.  Initially it was thought to be cardiac related however after CT head was done couple of hours after arrival, it showed a large cerebellar bleed.  Neurology was consulted immediately.  Upon entering the room patient's blood pressure was extremely elevated in the 200s and nurse mentioned that he was quickly decompensating.  Apparently he was able to talk and give a good history upon entering the emergency department but at this point he is breathing heavily and having difficulty giving any history.  CT head was immediately obtained and showed an acute intraparenchymal hemorrhage within the left cerebellum with volume of 28 mL and  intraventricular extension to the third and fourth ventricle.  There was also marked posterior fossa mass-effect and it is noted that he likely does have obstructive hydrocephalus developing.  Due to the decompensation patient was immediately brought back to CT scanner to obtain a CTA to rule out any aneurysmal or vascular malformations. Last dose of Xarelto was 24 hours thus reversing with [facility].   At baseline he is able to do everything himself.  MRs is 0  LKW: Approximately 1300 hrs. today tpa given?: no, intracranial hemorrhage Premorbid modified Rankin scale (mRS): 0  HOSPITAL COURSE Mr. Ingram Onnen is a 73 y.o. male with history of tobacco abuse, and pulmonary embolus, paroxysmal atrial fibrillation, hypertension, alcohol abuse with history of coagulopathy in setting of Xarelto from prior alcoholism presenting with dizziness, diaphoresis and bradycardia. Initially felt to be cardiac but cerebellar bleed found on imaging.   ICH: Left cerebellar ICH with IVH on Xarelto and aspirin, s/p [place] reversal and suboccipital hematoma evacuation. Developed hydrocephalus s/p EVD, as well as resultant R SDH requiring surgical evacuation  Neurosurgery consult Dr. Ronnald Ramp, post suboccipital Craniectomy 12/19/2018.Marland Kitchen Neurological worsening due to hydrocephalus 01/02/2019 requiring emergent ventriculostomy followed by further neurological worsening due to acute right subdural hematoma requiring emergent right craniotomy for hematoma evacuation  CT head 6/17 left cerebellar hemorrhage 28 mils with IVH.  Posterior fossa without hydrocephalus  CTA head motion degraded.  No gross AVM or aneurysm  CT head 6/18 L suboccipital craniotomy, evacuation L cerebellar hemorrhage.  Decreased mass-effect on fourth ventricle and posterior fossa.  IVH bilaterally.  Minimal pneumocephalus.  Atherosclerosis.  CT Head - 6/19 - Stable volume of intracranial hemorrhage that is mainly intraventricular.   CT Head - 6/21 6213 -  interval worsening lateral and 3rd vent hydrocephalus. Stable L cerebellar hmg evacuation  CT Head - 6/21 0715 - interval development large R SDH 34m thick w/ 170mshift. EVD placement with decompression of ventricles. Postop L occipital crani stable.  CT repeat 6/22 0321 stable hematoma s/p evacuation and IVH, no hydrocephalus but right hemisphere developing hygroma  CT head 6/24 slight decrease in ventricle size  CT head 6/25 stable  2D Echo EF 50 to 55%  LDL 48  HgbA1c  6.1  SCDs for VTE prophylaxis  aspirin 81 mg daily and Xarelto (rivaroxaban) daily prior to admission, now on No antithrombotic given hemorrhage.   Family requested comfort care measures given extreme poor prognosis.   Cerebral Edema with MLS due to right SDH s/p evaculation Induced Hypernatremia   Treated with 3%, now off  Off dexamethasone   Na 153->150->148  Was on free water 300 q4h  CT head 6/26 - stable SDH and no MLS  Hydrocephalus and right SDH   EVD placement   Neurological worsening due to hydrocephalus 01/02/2019 requiring emergent ventriculostomy followed by further neurological worsening due to acute right SDH requiring emergent right craniotomy for hematoma evacuation  Put on ancef 6/21>>  CT 6/22 stable post evacuation  EVD clamped 6/23  CT serials decreasing ventricle size and stable SDH   EVD out 6/25  D/c decadron  New onset seizure  Started 6/23 0929 after EVD clamped  Treated w/ versed  Loaded with Keppra and valproic acid   EEG w/ seizure  LTM EEG w/ R sided dysfunction, no seizure. D/c LT EEG 6/25  6/26 EEG right hemisphere slowing  On keppra 1000, vimpat 100 and depakote 1000 Q8  depakote level 84->76->56  Acute Hypoxic Respiratory failure / VDRF  Secondary to stroke   Intubated in ED   Extubated 12/31/18  Re-intubated 6/21 for neuro worsening and SDH  Cheyne-Stokes breathing pattern  Post CABG Atrial Fibrillation  CABG 06/2018  Home  anticoagulation:  Xarelto (rivaroxaban) daily   Reversed with [place]  AC on hold d/t bleed  still afib RVR   Hypertensive emergency  Home meds: Cozaar 100, Toprol 50  Systolic blood pressure greater than 200s with hemorrhage on admission  Was treated with Cleviprex   On norvasc 2.5, losartan 100, metoprolol 50 bid  Fever with leukocytosis  WBC 24.2-31.9-26.8-19.7  Decadron discontinued  T-max 100.7-100.2-101.7  Was on Ancef  Cooling blanket  Blood culture - No growth to date  Hyperlipidemia  Home meds: Lipitor 80  LDL 48, at goal < 70  Hold statin given hemorrhage  Hyperglycemia  HgbA1c 6.1  Was treated with novolog 3u q4h  Dysphagia   Secondary to stroke   n.p.o.   Cortrak placed 01/01/2019 - then intubated with OG  Was on tube feeds  Other Stroke Risk Factors  Advanced age  Former smoker, quit smoking 6 months ago  History of alcohol abuse, last drink 10 years ago. LFTs normal  Coronary artery disease, NSTEMI s/p CABG, December 2019  Other Active Problems  Mildly elevated troponin 0.05, reactionary  Anemia 11.8-10.2-8.9  Thrombocytopenia [id number]   The patient was terminally extubated 01/18/2019 at 11:29 AM.  The patient's family and RN were at the bedside. Patient expired at 1300 PM.    Rosalin Hawking, MD PhD Stroke Neurology 01-18-2019 3:35 PM

## 2019-01-13 NOTE — Progress Notes (Signed)
Neurosurgery Service Progress Note  Subjective: No acute events overnight, family planning on w/d'ing care today   Objective: Vitals:   2019/02/05 0915 02-05-19 0930 05-Feb-2019 0945 02-05-2019 1000  BP: (!) 143/64 102/64 (!) 86/56 (!) 108/58  Pulse: 82 (!) 159 76 85  Resp: (!) 31 (!) 36 (!) 31 (!) 35  Temp:      TempSrc:      SpO2: 98% 98% 98% 98%  Weight:      Height:       Temp (24hrs), Avg:100.3 F (37.9 C), Min:99.1 F (37.3 C), Max:101.7 F (38.7 C)  CBC Latest Ref Rng & Units 05-Feb-2019 01/09/2019 01/08/2019  WBC 4.0 - 10.5 K/uL 19.7(H) 26.8(H) 31.9(H)  Hemoglobin 13.0 - 17.0 g/dL 8.9(L) 8.9(L) 10.2(L)  Hematocrit 39.0 - 52.0 % 28.0(L) 27.6(L) 31.6(L)  Platelets 150 - 400 K/uL 121(L) 128(L) 159   BMP Latest Ref Rng & Units 02/05/19 01/09/2019 01/08/2019  Glucose 70 - 99 mg/dL 132(H) 158(H) 136(H)  BUN 8 - 23 mg/dL 42(H) 49(H) 58(H)  Creatinine 0.61 - 1.24 mg/dL 1.08 1.20 1.28(H)  BUN/Creat Ratio 10 - 24 - - -  Sodium 135 - 145 mmol/L 148(H) 150(H) 150(H)  Potassium 3.5 - 5.1 mmol/L 3.2(L) 3.4(L) 3.6  Chloride 98 - 111 mmol/L 114(H) 115(H) 119(H)  CO2 22 - 32 mmol/L '23 22 22  '$ Calcium 8.9 - 10.3 mg/dL 6.8(L) 6.7(L) 7.2(L)    Intake/Output Summary (Last 24 hours) at 02/05/2019 1009 Last data filed at 02-05-2019 0842 Gross per 24 hour  Intake 8548.03 ml  Output 3450 ml  Net 5098.03 ml    Current Facility-Administered Medications:  .  0.9 %  sodium chloride infusion, , Intravenous, PRN, Earnie Larsson, MD, Stopped at 01/06/19 1455 .  [DISCONTINUED] acetaminophen (TYLENOL) tablet 650 mg, 650 mg, Oral, Q4H PRN **OR** acetaminophen (TYLENOL) suppository 650 mg, 650 mg, Rectal, Q4H PRN **OR** acetaminophen (TYLENOL) solution 650 mg, 650 mg, Per Tube, Q4H PRN, Earnie Larsson, MD, 650 mg at 02/05/19 0917 .  [EXPIRED] amiodarone (NEXTERONE PREMIX) 360-4.14 MG/200ML-% (1.8 mg/mL) IV infusion, 60 mg/hr, Intravenous, Continuous, Stopped at 01/09/19 1912 **FOLLOWED BY** amiodarone (NEXTERONE  PREMIX) 360-4.14 MG/200ML-% (1.8 mg/mL) IV infusion, 30 mg/hr, Intravenous, Continuous, Agarwala, Ravi, MD, Last Rate: 16.67 mL/hr at February 05, 2019 0842, 30 mg/hr at 02/05/2019 0842 .  chlorhexidine gluconate (MEDLINE KIT) (PERIDEX) 0.12 % solution 15 mL, 15 mL, Mouth Rinse, BID, Garvin Fila, MD, 15 mL at 02-05-2019 0815 .  Chlorhexidine Gluconate Cloth 2 % PADS 6 each, 6 each, Topical, Daily, Erick Colace, NP, 6 each at 01/09/19 2200 .  feeding supplement (VITAL AF 1.2 CAL) liquid 1,000 mL, 1,000 mL, Per Tube, Continuous, Clydell Hakim, MD, Last Rate: 60 mL/hr at 01/09/19 2000 .  free water 300 mL, 300 mL, Per Tube, Q4H, Rush Farmer, MD, 300 mL at 05-Feb-2019 0916 .  hydrALAZINE (APRESOLINE) injection 20 mg, 20 mg, Intravenous, Q6H PRN, Erick Colace, NP, 20 mg at 01/06/19 0216 .  insulin aspart (novoLOG) injection 0-9 Units, 0-9 Units, Subcutaneous, Q4H, Earnie Larsson, MD, 1 Units at 02-05-19 0000 .  insulin aspart (novoLOG) injection 3 Units, 3 Units, Subcutaneous, Q4H, Erick Colace, NP, 3 Units at 2019/02/05 [id number] .  labetalol (NORMODYNE) injection 10-40 mg, 10-40 mg, Intravenous, Q10 min PRN, Earnie Larsson, MD, 10 mg at 01/02/19 1752 .  lacosamide (VIMPAT) 100 mg in sodium chloride 0.9 % 25 mL IVPB, 100 mg, Intravenous, Q12H, Garvin Fila, MD, Last Rate: 70 mL/hr at February 05, 2019 0914,  100 mg at 01-12-2019 0914 .  levETIRAcetam (KEPPRA) IVPB 1500 mg/ 100 mL premix, 1,500 mg, Intravenous, Q12H, Garvin Fila, MD, Last Rate: 400 mL/hr at January 12, 2019 1007, 1,500 mg at 12-Jan-2019 1007 .  LORazepam (ATIVAN) injection 0.5-2 mg, 0.5-2 mg, Intravenous, Q1H PRN, Frederik Pear, MD, 2 mg at 2019/01/12 0510 .  losartan (COZAAR) tablet 100 mg, 100 mg, Per Tube, Daily, Salvadore Dom E, NP, 100 mg at 01/07/19 1000 .  MEDLINE mouth rinse, 15 mL, Mouth Rinse, 10 times per day, Garvin Fila, MD, 15 mL at 01/12/2019 0916 .  metoprolol tartrate (LOPRESSOR) 25 mg/10 mL oral suspension 50 mg, 50 mg, Per Tube, BID,  Erick Colace, NP, 50 mg at 01/09/19 0940 .  metoprolol tartrate (LOPRESSOR) injection 5 mg, 5 mg, Intravenous, Q5 min PRN, Agarwala, Ravi, MD, 5 mg at 01/09/19 1037 .  [DISCONTINUED] ondansetron (ZOFRAN) tablet 4 mg, 4 mg, Oral, Q4H PRN **OR** ondansetron (ZOFRAN) injection 4 mg, 4 mg, Intravenous, Q4H PRN, Pool, Mallie Mussel, MD .  pantoprazole sodium (PROTONIX) 40 mg/20 mL oral suspension 40 mg, 40 mg, Per Tube, Q1200, Erick Colace, NP, 40 mg at 01/09/19 1136 .  phenylephrine (NEOSYNEPHRINE) 10-0.9 MG/250ML-% infusion, 0-400 mcg/min, Intravenous, Titrated, Ogan, Okoronkwo U, MD, Last Rate: 60 mL/hr at 01/12/2019 0842, 40 mcg/min at 01-12-2019 0842 .  potassium chloride 20 MEQ/15ML (10%) solution 30 mEq, 30 mEq, Per Tube, Q4H, Deterding, Guadelupe Sabin, MD, 30 mEq at January 12, 2019 0916 .  promethazine (PHENERGAN) tablet 12.5-25 mg, 12.5-25 mg, Oral, Q4H PRN, Pool, Mallie Mussel, MD .  Racepinephrine HCl 2.25 % nebulizer solution 0.5 mL, 0.5 mL, Nebulization, Q2H PRN, Earnie Larsson, MD .  senna (SENOKOT) tablet 8.6 mg, 1 tablet, Per Tube, BID, Erick Colace, NP, 8.6 mg at Jan 12, 2019 0917 .  sodium chloride flush (NS) 0.9 % injection 10-40 mL, 10-40 mL, Intracatheter, Q12H, Earnie Larsson, MD, 10 mL at January 12, 2019 0917 .  sodium chloride flush (NS) 0.9 % injection 10-40 mL, 10-40 mL, Intracatheter, PRN, Earnie Larsson, MD .  valproate (DEPACON) 1,000 mg in dextrose 5 % 50 mL IVPB, 1,000 mg, Intravenous, Q8H, Biby, Sharon L, NP, Stopped at Jan 12, 2019 9290   Physical Exam: Intubated, no sedation, pupils NR OD, no corneals, +c/g, no response to painful stim x4 Incision c/d/i  Assessment & Plan: 73 y.o. man w/ cerebellar ICH s/p evac w/ HCP s/p EVD  -planned w/d of care today, no change in neurosurgical plan of care  Judith Part  01-12-2019 10:09 AM

## 2019-01-13 NOTE — Progress Notes (Signed)
.  [facility] ADULT ICU REPLACEMENT PROTOCOL FOR AM LAB REPLACEMENT ONLY  The patient does apply for the [facility] Adult ICU Electrolyte Replacment Protocol based on the criteria listed below:   1. Is GFR >/= 40 ml/min? Yes.    Patient's GFR today is >60 2. Is urine output >/= 0.5 ml/kg/hr for the last 6 hours? Yes.   Patient's UOP is 2.81 ml/kg/hr 3. Is BUN < 60 mg/dL? Yes.    Patient's BUN today is 42 4. Abnormal electrolyte(s): K+ 3.2 5. Ordered repletion with: protocol 6. If a panic level lab has been reported, has the CCM MD in charge been notified? Yes.  .   Physician:  Dr. Marja Kays, [place] Grumbling 02/09/19 6:42 AM

## 2019-01-13 NOTE — Procedures (Signed)
Extubation Procedure Note  Patient Details:   Name: Jaedan Huttner DOB: 18-May-1946 MRN: [id number]   Airway Documentation:    Vent end date: 2019/01/31 Vent end time: 1125   Evaluation  O2 sats: stable throughout Complications: No apparent complications Patient did tolerate procedure well. Bilateral Breath Sounds: Clear, Diminished   No   Pt terminally extubated per MD order.  Family and RN at bedside.   Pierre Bali 01-31-2019, 11:29 AM

## 2019-01-13 NOTE — Progress Notes (Signed)
STROKE TEAM PROGRESS NOTE   INTERVAL HISTORY Patient RN is at the bedside.  Patient still intubated, unresponsive. Continue to have fever. Dr. Lynetta Mare discussed with family yesterday and plan for comfort care today.   Vitals:   2019-02-02 0630 02/02/19 0645 2019/02/02 0700 02-02-2019 0715  BP: (!) 86/54 (!) 125/58 (!) 113/48 131/60  Pulse: 64 76 62 74  Resp: (!) 26 (!) 32 (!) 28 (!) 29  Temp:      TempSrc:      SpO2: 99% 99% 98% 100%  Weight:      Height:        CBC:  Recent Labs  Lab 01/09/19 0557 02/02/19 0513  WBC 26.8* 19.7*  NEUTROABS 23.4* 17.1*  HGB 8.9* 8.9*  HCT 27.6* 28.0*  MCV 95.5 95.2  PLT 128* 121*    Basic Metabolic Panel:  Recent Labs  Lab 01/09/19 0557 2019-02-02 0513  NA 150* 148*  K 3.4* 3.2*  CL 115* 114*  CO2 22 23  GLUCOSE 158* 132*  BUN 49* 42*  CREATININE 1.20 1.08  CALCIUM 6.7* 6.8*  MG 2.8* 2.7*  PHOS 2.9 2.5   IMAGING  Ct Head Wo Contrast 01/08/2019 IMPRESSION: 1. Similar appearing right-sided acute infarcts with small volume right frontal hemorrhage. No midline shift or herniation. 2. Decreased intraventricular hemorrhage in the left occipital horn. Otherwise unchanged multifocal intracranial hemorrhage as described above.  Ct Head Wo Contrast 01/07/2019 1. Right ACA and PCA territory acute infarcts with small volume right frontal hemorrhage. 2. Stable multifocal intracranial hemorrhage as described. 3. Right frontal drain with normal ventricular volume.  01/06/2019 Slight decrease in size the lateral ventricles and mildly decreased pneumocephalus. Otherwise unchanged examination.  01/04/2019  resolution of right-sided subdural hematoma and midline shift with postoperative changes.  Right ventricular catheter in optimal position.  Unchanged appearance of left cerebellar hematoma evacuation.  Dg Chest Port 1 View 01/05/2019 1. Stable hardware positioning. 2. Improved left base aeration.  EEG  01/07/2019 No ongoing sz per Leonie Man review.   Brief 2-second run of irritability.  Formal reading pending  01/06/2019 This EEG shows evidence of cerebral dysfunction maximal in the right hemisphere and a severe diffuse encephalopathy. No epileptiform discharges or EEG seizures were recorded.  01/05/2019 This is an abnormal EEG.  There is evidence of a seizure tendency emanating from the right central area of the brain with an electrographic seizure emanating from the right frontocentral area of the brain.     PHYSICAL EXAM:  General - Well nourished, well developed, elderly Caucasian male who is intubated without sedation  Ophthalmologic - fundi not visualized due to noncooperation.  Cardiovascular - irregularly irregular heart rate and rhythm, afib RVR.  Neuro - intubated not on sedation.  Eyes are closed, not open to voice or pain.  Unresponsive, not following commands.  Bilateral pupils 2.5 mm not reactive to light.  Eyes in middle position, absence of doll's eyes.  No tracking, no blinking to visual threat bilaterally.  Corneal reflex absent bilaterally.  Positive gag reflexes. Breathing over the vent.  Facial symmetry not able to test due to ET tube. Tongue protrusion not able to test. On pain stimulation, BUE extension posturing, no movement of BLEs. DTR absent and no babinski. Sensation, coordination and gait not tested.   ASSESSMENT/PLAN Mr. Ustin Cruickshank is a 73 y.o. male with history of tobacco abuse, and pulmonary embolus, paroxysmal atrial fibrillation, hypertension, alcohol abuse with history of coagulopathy in setting of Xarelto from prior alcoholism presenting with dizziness,  diaphoresis and bradycardia. Initially felt to be cardiac but cerebellar bleed found on imaging.   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 Developed hydrocephalus s/p EVD, as well as resultant R SDH requiring surgical evacuation  Neurosurgery consult Dr. Ronnald Ramp, post suboccipital  Craniectomy 12/26/2018.Marland Kitchen Neurological worsening due to hydrocephalus 01/02/2019 requiring emergent ventriculostomy followed by further neurological worsening due to acute right subdural hematoma requiring emergent right craniotomy for hematoma evacuation  CT head 6/17 left cerebellar hemorrhage 28 mils with IVH.  Posterior fossa without hydrocephalus  CTA head motion degraded.  No gross AVM or aneurysm  CT head 6/18 L suboccipital craniotomy, evacuation L cerebellar hemorrhage.  Decreased mass-effect on fourth ventricle and posterior fossa.  IVH bilaterally.  Minimal pneumocephalus.  Atherosclerosis.  CT Head - 6/19 - Stable volume of intracranial hemorrhage that is mainly intraventricular.   CT Head - 6/21 5176 - interval worsening lateral and 3rd vent hydrocephalus. Stable L cerebellar hmg evacuation  CT Head - 6/21 0715 - interval development large R SDH 26mm thick w/ 24mm shift. EVD placement with decompression of ventricles. Postop L occipital crani stable.  CT repeat 6/22 0321 stable hematoma s/p evacuation and IVH, no hydrocephalus but right hemisphere developing hygroma  CT head 6/24 slight decrease in ventricle size  CT head 6/25 stable  2D Echo EF 50 to 55%  LDL 48  HgbA1c 6.1  SCDs for VTE prophylaxis  aspirin 81 mg daily and Xarelto (rivaroxaban) daily prior to admission, now on No antithrombotic given hemorrhage.    Family plan for comfort care today given poor prognosis.   Cerebral Edema with MLS due to right SDH s/p evaculation Induced Hypernatremia   Treated with 3%, now off  Off dexamethasone   Na 153->150->148  On free water 300 q4h  CT head 6/26 - stable SDH and no MLS  Check Na daily  Hydrocephalus and right SDH   EVD placement   Neurological worsening due to hydrocephalus 01/02/2019 requiring emergent ventriculostomy followed by further neurological worsening due to acute right SDH requiring emergent right craniotomy for hematoma evacuation  Put on  ancef 6/21>>  CT 6/22 stable post evacuation  EVD clamped 6/23  CT serials decreasing ventricle size and stable SDH   EVD out 6/25  D/c decadron  New onset seizure  Started 6/23 0929 after EVD clamped  Treated w/ versed  Loaded with Keppra and valproic acid   EEG w/ seizure  LTM EEG w/ R sided dysfunction, no seizure. D/c LT EEG 6/25  6/26 EEG right hemisphere slowing  On keppra 1000, vimpat 100 and depakote 1000 Q8  depakote level 84->76->56  Acute Hypoxic Respiratory failure / VDRF  Secondary to stroke   Intubated in ED   Extubated 12/31/18  Re-intubated 6/21 for neuro worsening and SDH  Cheyne-Stokes breathing pattern  CCM on board  Post CABG Atrial Fibrillation  CABG 06/2018  Home anticoagulation:  Xarelto (rivaroxaban) daily   Reversed with [place]  AC on hold d/t bleed  Currently still afib RVR with HR 140s  Hypertensive emergency  Home meds: Cozaar 100, Toprol 50  Systolic blood pressure greater than 200s on presentation  Treated with Cleviprex, now off  On norvasc 2.5, losartan 100, metoprolol 50 bid  Stable for now  SBP goal <160   Fever with leukocytosis  WBC 24.2->31.9->26.8->19.7  Decadron discontinued  T-max 100.7-100.2->101.7  On Ancef  Cooling blanket  CCM on board   UA pending  Blood culture - no growth day # 1  Hyperlipidemia  Home meds: Lipitor 80  LDL 48, at goal < 70  Hold statin given hemorrhage  Hyperglycemia  HgbA1c 6.1  On novolog 3u q4h  SSI  Dysphagia   Secondary to stroke   Currently n.p.o. , on tube feeds  Speech following  Cortrak placed 01/01/2019 - now intubated with OG  Other Stroke Risk Factors  Advanced age  Former smoker, quit smoking 6 months ago  History of alcohol abuse, last drink 10 years ago. LFTs normal  Coronary artery disease, NSTEMI s/p CABG, December 2019  Other Active Problems  Mildly elevated troponin 0.05, reactionary  Anemia  11.8->10.2->8.9  Thrombocytopenia 146->159->128  Hypokalemia - 3.2 -> supplemented  Hospital day # 11  This patient is critically ill and at significant risk of neurological worsening, death and care requires constant monitoring of vital signs, hemodynamics,respiratory and cardiac monitoring, extensive review of multiple databases, frequent neurological assessment, discussion with family, other specialists and medical decision making of high complexity. I spent 30 minutes of neurocritical care time  in the care of  this patient.   Rosalin Hawking, MD PhD Stroke Neurology January 16, 2019 10:48 AM   To contact Stroke Continuity provider, please refer to [email or web address] After hours, contact General Neurology

## 2019-01-13 NOTE — Progress Notes (Signed)
S: family members have come and spent time with Mr. Quest this morning. Have made phone calls for other family members to say goodbye O: unchanged from prev. A: EOL, withdrawal of ventilation support, pressors per family wishes PLAN: withdrawal orders, comfort care, sedation as needed.  Critical care time: 31 min   Bonna Gains 01-25-19 11:07 AM

## 2019-01-13 DEATH — deceased

## 2019-01-14 LAB — CULTURE, BLOOD (ROUTINE X 2)
Culture: NO GROWTH
Culture: NO GROWTH

## 2020-04-03 IMAGING — CT CT HEAD WITHOUT CONTRAST
3 series · 15 of 47 positions shown, 18 images · non-contrast
Comparison: CT head from yesterday.

CLINICAL DATA: Decreased responsiveness. Acute stroke with
multifocal intracranial hemorrhage.

EXAM:
CT HEAD WITHOUT CONTRAST
TECHNIQUE: Contiguous axial images were obtained from the base of the skull
through the vertex without intravenous contrast.

[Series 3: head 5.0 h30s · axial · 0.43mm/px · z∈[-159,-19]mm · 9 of 34 slices shown, 12 images]
[im 3/34  brain]
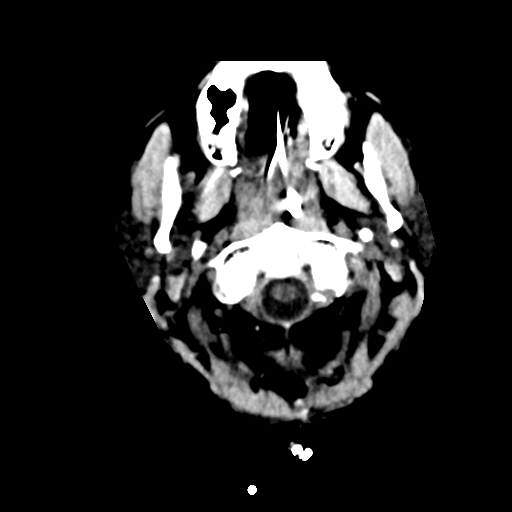
[im 3/34  bone]
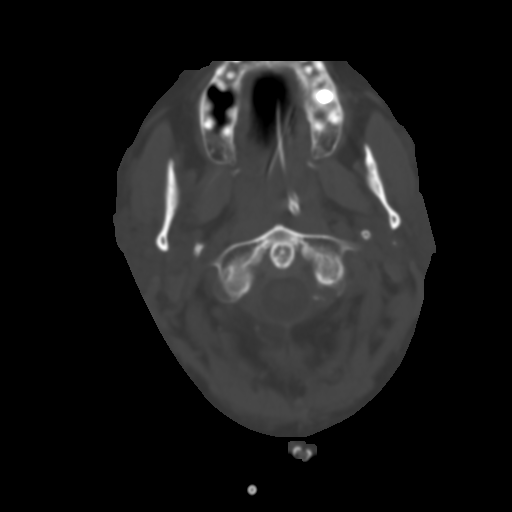
[im 6/34  brain]
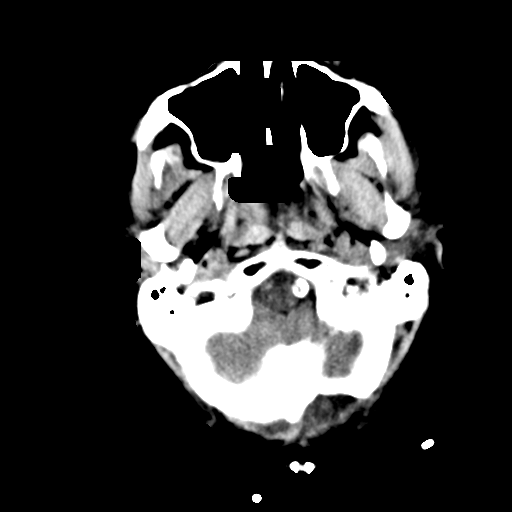
[im 10/34  brain]
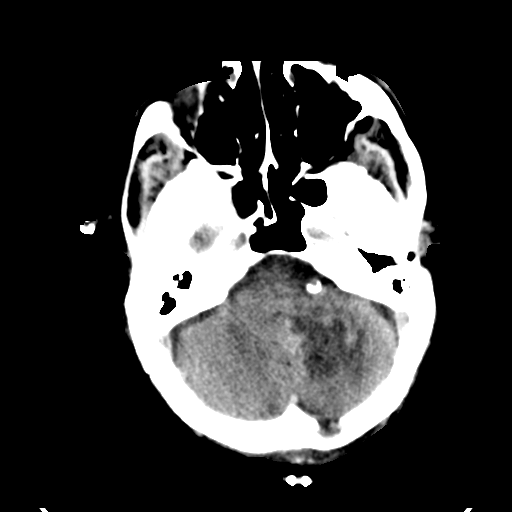
[im 13/34  brain]
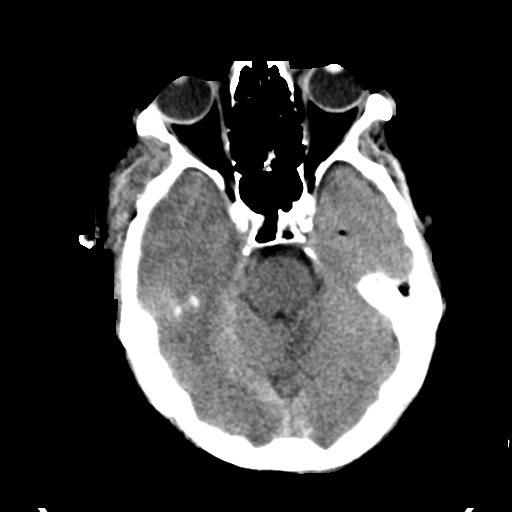
[im 18/34  brain]
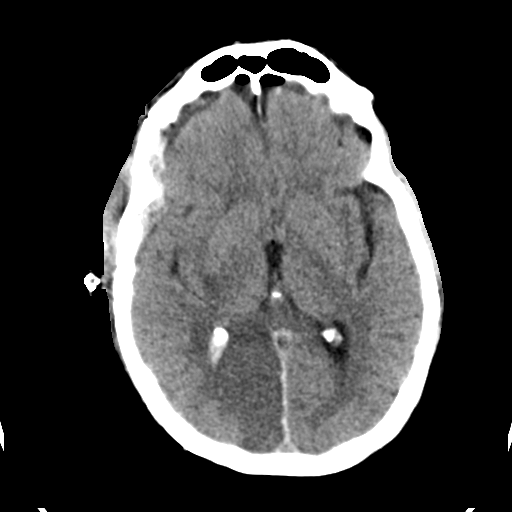
[im 18/34  bone]
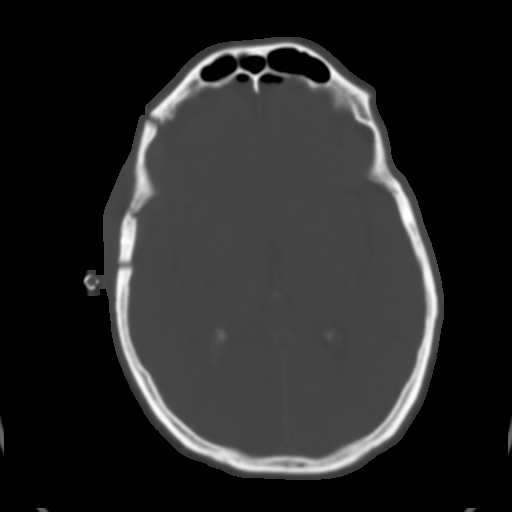
[im 21/34  brain]
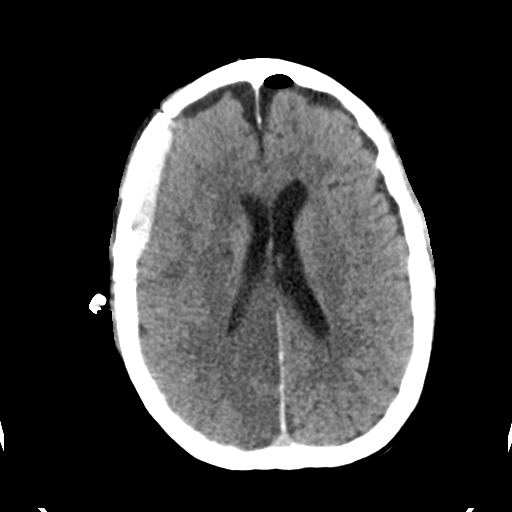
[im 24/34  brain]
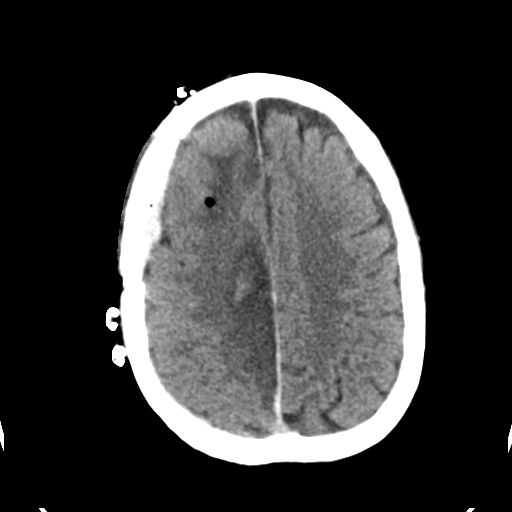
[im 28/34  brain]
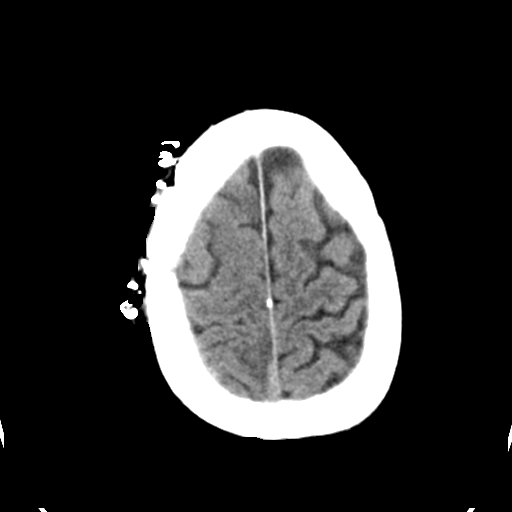
[im 31/34  brain]
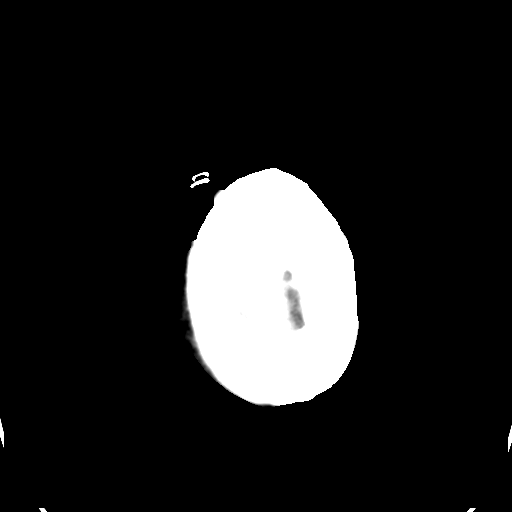
[im 31/34  bone]
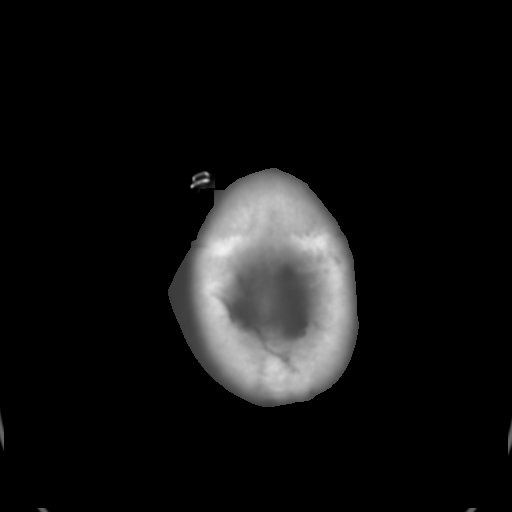

[Series 5: head 3.0 mpr cor · coronal · 0.33mm/px · 3 of 67 slices shown]
[im 23/67  brain]
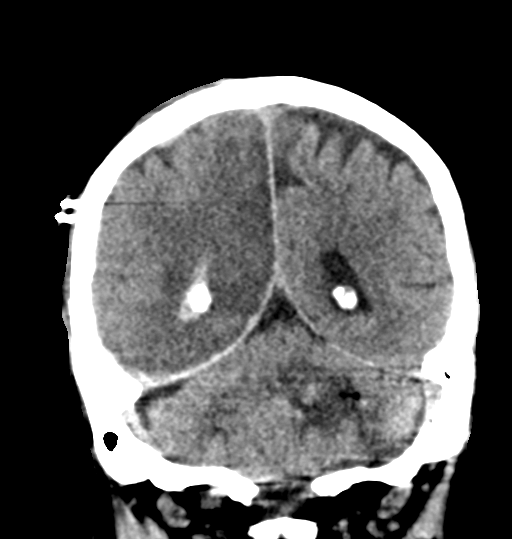
[im 30/67  brain]
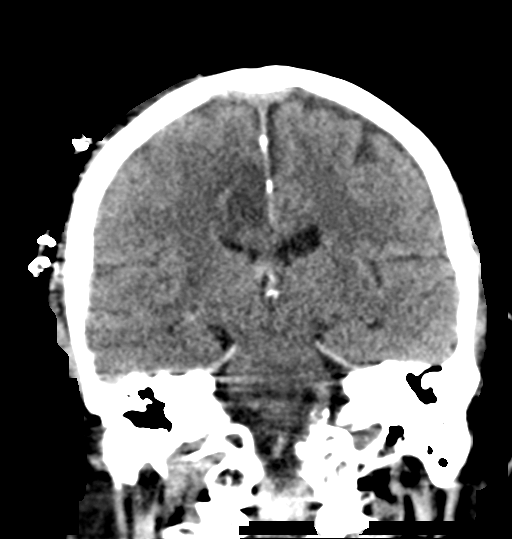
[im 37/67  brain]
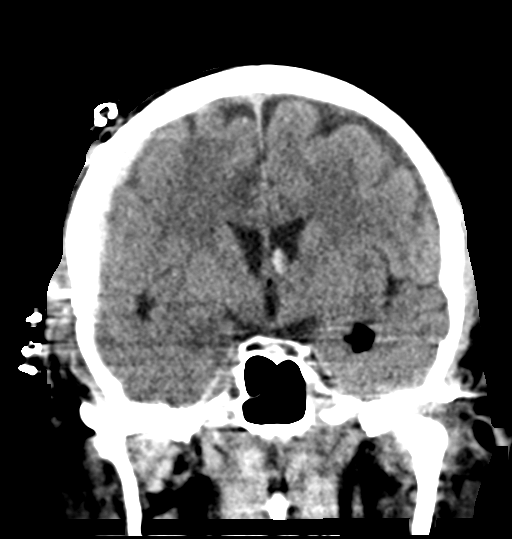

[Series 6: head 3.0 mpr sag · sagittal · 0.33mm/px · 3 of 59 slices shown]
[im 20/59  brain]
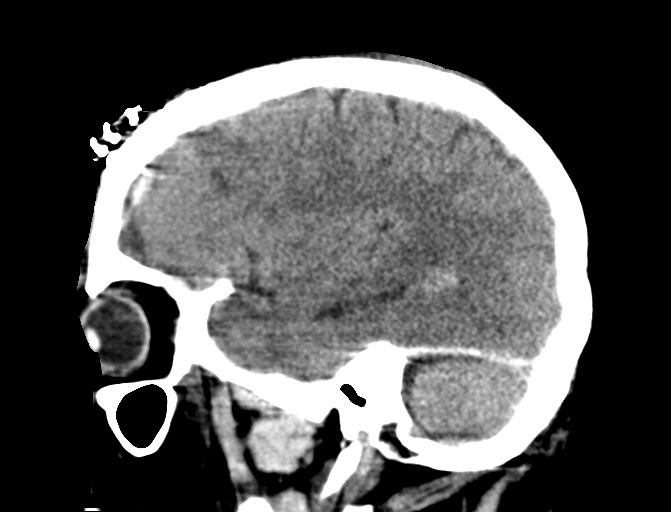
[im 30/59  brain]
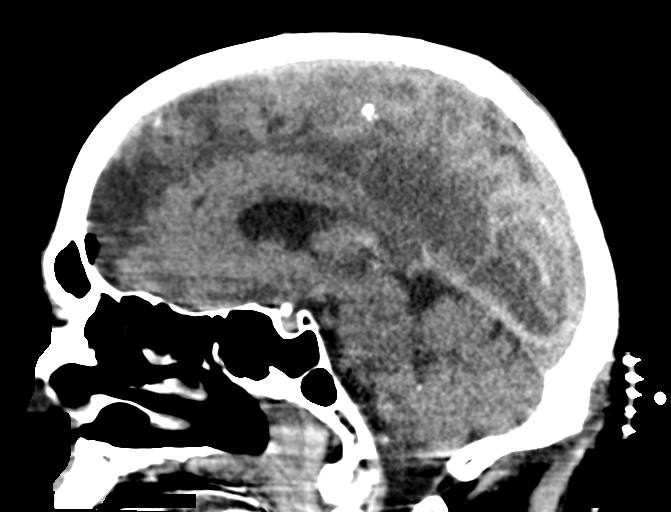
[im 39/59  brain]
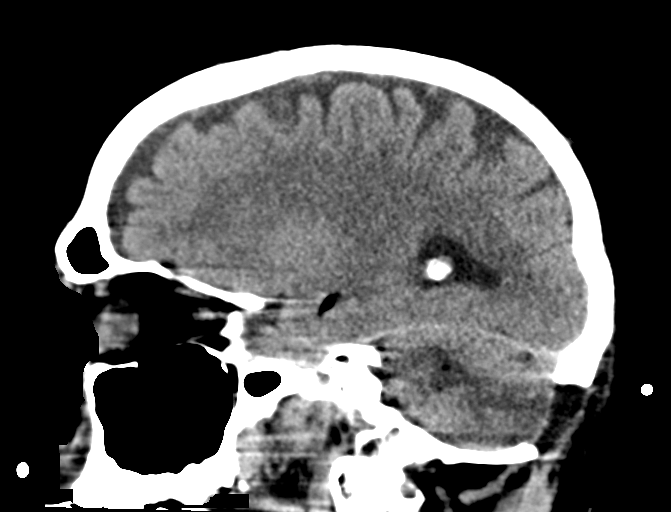

[15 of 47 positions shown; findings below may reference images not displayed]

FINDINGS: Brain: Extensive cytotoxic edema again noted within the parasagittal
right frontal, parietal, occipital, and temporal lobes. No midline
shift or herniation. Unchanged small volume parenchymal hemorrhage
in the parasagittal right frontal lobe. Unchanged edema and small
volume residual hemorrhage in the left cerebellum.

Interval removal of the right frontal ventriculostomy. No
hydrocephalus. Similar-appearing intraventricular hemorrhage in the
right occipital horn and left lateral ventricle near the foramen of
[place]. Decreased intraventricular hemorrhage in the left occipital
horn.

Unchanged trace subdural hematoma along the posterior falx and right
tentorium. 9 mm extra-axial hematoma deep to the right pterional
flap is unchanged by my measurements. No new extra-axial collection.

Unchanged small volume pneumocephalus.

Vascular: Calcified atherosclerosis at the skullbase. No hyperdense
vessel.

Skull: Prior right-sided craniotomy and left suboccipital
craniectomy.

Sinuses/Orbits: No acute finding.

Other: None.
IMPRESSION: 1. Similar appearing right-sided acute infarcts with small volume
right frontal hemorrhage. No midline shift or herniation.
2. Decreased intraventricular hemorrhage in the left occipital horn.
Otherwise unchanged multifocal intracranial hemorrhage as described
above.
